# Patient Record
Sex: Female | Born: 1950
Health system: Southern US, Community
[De-identification: ages and names within clinical notes are randomized; demographics above are authoritative.]

## PROBLEM LIST (undated history)

## (undated) DIAGNOSIS — Z87898 Personal history of other specified conditions: Secondary | ICD-10-CM

## (undated) DIAGNOSIS — I8390 Asymptomatic varicose veins of unspecified lower extremity: Secondary | ICD-10-CM

## (undated) DIAGNOSIS — F32A Depression, unspecified: Secondary | ICD-10-CM

## (undated) DIAGNOSIS — T7029XA Other effects of high altitude, initial encounter: Secondary | ICD-10-CM

## (undated) DIAGNOSIS — K219 Gastro-esophageal reflux disease without esophagitis: Secondary | ICD-10-CM

## (undated) DIAGNOSIS — D649 Anemia, unspecified: Secondary | ICD-10-CM

## (undated) DIAGNOSIS — F329 Major depressive disorder, single episode, unspecified: Secondary | ICD-10-CM

## (undated) DIAGNOSIS — G709 Myoneural disorder, unspecified: Secondary | ICD-10-CM

## (undated) DIAGNOSIS — Z8719 Personal history of other diseases of the digestive system: Secondary | ICD-10-CM

## (undated) DIAGNOSIS — F419 Anxiety disorder, unspecified: Secondary | ICD-10-CM

## (undated) DIAGNOSIS — C44611 Basal cell carcinoma of skin of unspecified upper limb, including shoulder: Secondary | ICD-10-CM

## (undated) DIAGNOSIS — L570 Actinic keratosis: Secondary | ICD-10-CM

## (undated) DIAGNOSIS — R609 Edema, unspecified: Secondary | ICD-10-CM

## (undated) DIAGNOSIS — M1990 Unspecified osteoarthritis, unspecified site: Secondary | ICD-10-CM

## (undated) HISTORY — PX: CERVICAL FUSION: SHX112

## (undated) HISTORY — DX: Basal cell carcinoma of skin of unspecified upper limb, including shoulder: C44.611

## (undated) HISTORY — PX: FOOT FUSION: SHX956

## (undated) HISTORY — DX: Other effects of high altitude, initial encounter: T70.29XA

## (undated) HISTORY — PX: CARPAL TUNNEL RELEASE: SHX101

## (undated) HISTORY — DX: Personal history of other specified conditions: Z87.898

## (undated) HISTORY — DX: Anemia, unspecified: D64.9

## (undated) HISTORY — PX: ABDOMINAL HYSTERECTOMY: SHX81

## (undated) HISTORY — DX: Personal history of other diseases of the digestive system: Z87.19

---
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
## 2006-04-22 DIAGNOSIS — Z87898 Personal history of other specified conditions: Secondary | ICD-10-CM

## 2006-04-22 DIAGNOSIS — T7020XA Unspecified effects of high altitude, initial encounter: Secondary | ICD-10-CM

## 2006-04-22 DIAGNOSIS — D649 Anemia, unspecified: Secondary | ICD-10-CM

## 2006-04-22 DIAGNOSIS — Z8711 Personal history of peptic ulcer disease: Secondary | ICD-10-CM

## 2006-04-22 HISTORY — DX: Anemia, unspecified: D64.9

## 2006-04-22 HISTORY — DX: Personal history of other specified conditions: Z87.898

## 2006-04-22 HISTORY — DX: Personal history of peptic ulcer disease: Z87.11

## 2006-04-22 HISTORY — DX: Unspecified effects of high altitude, initial encounter: T70.20XA

## 2009-02-08 ENCOUNTER — Ambulatory Visit: Payer: Self-pay | Admitting: Specialist

## 2009-02-17 ENCOUNTER — Inpatient Hospital Stay: Payer: Self-pay | Admitting: Internal Medicine

## 2011-03-28 LAB — BASIC METABOLIC PANEL
Creatinine: 0.8 mg/dL (ref 0.5–1.1)
Glucose: 74 mg/dL
Potassium: 4 mmol/L (ref 3.4–5.3)
Sodium: 140 mmol/L (ref 137–147)

## 2011-03-28 LAB — CBC AND DIFFERENTIAL
HCT: 37 % (ref 36–46)
WBC: 2.5 10^3/mL

## 2011-03-28 LAB — LIPID PANEL: HDL: 81 mg/dL — AB (ref 35–70)

## 2011-05-29 ENCOUNTER — Ambulatory Visit (INDEPENDENT_AMBULATORY_CARE_PROVIDER_SITE_OTHER): Payer: BC Managed Care – PPO | Admitting: Internal Medicine

## 2011-05-29 ENCOUNTER — Encounter: Payer: Self-pay | Admitting: Internal Medicine

## 2011-05-29 VITALS — BP 110/60 | HR 76 | Temp 98.3°F | Ht 66.0 in

## 2011-05-29 DIAGNOSIS — C44611 Basal cell carcinoma of skin of unspecified upper limb, including shoulder: Secondary | ICD-10-CM | POA: Insufficient documentation

## 2011-05-29 DIAGNOSIS — Z803 Family history of malignant neoplasm of breast: Secondary | ICD-10-CM

## 2011-05-29 DIAGNOSIS — Z8719 Personal history of other diseases of the digestive system: Secondary | ICD-10-CM

## 2011-05-29 DIAGNOSIS — G4700 Insomnia, unspecified: Secondary | ICD-10-CM

## 2011-05-29 MED ORDER — ZOLPIDEM TARTRATE ER 12.5 MG PO TBCR: 12.5000 mg | EXTENDED_RELEASE_TABLET | Freq: Every evening | ORAL | Status: DC | PRN

## 2011-05-29 NOTE — Assessment & Plan Note (Addendum)
Has been having insomnia for over 10 yeas, controlled on ambien cr with periodic awakenings . Has had difficulty lately going back to sleep because of family stressors.  Recommended daily exercise to relieve stress,  continue ambien cr.

## 2011-05-29 NOTE — Progress Notes (Signed)
  Subjective:    Patient ID: Jessica Haas, female    DOB: [birth year], 61 y.o.   MRN: [id number]  HPI Jessica Haas is a healthy 61 year old white female with a history of a gastric ulcer in 2010 which occurred in the setting of NSAID use for shoulder and wrist pain: It in any brief admission to the [facility] for evaluation and transfusion. He is here to establish primary care and is transferring from Dr. Francia Greaves. All records are not currently available. Patient feels generally well except for occasional hot flashes and anxiety. She has a history of prior trial of Wellbutrin which caused palpitations.  Past Medical History  Diagnosis Date  . Basal cell carcinoma of hand   . H/O abnormal mammogram 2008    receiving annual MRIs, [facility]   . High altitude sickness 2008  . Anemia 2008    required transfusion  . H/O gastric ulcer 2008   No current outpatient prescriptions on file prior to visit.     Review of Systems  Constitutional: Negative for fever, chills and unexpected weight change.  HENT: Negative for hearing loss, ear pain, nosebleeds, congestion, sore throat, facial swelling, rhinorrhea, sneezing, mouth sores, trouble swallowing, neck pain, neck stiffness, voice change, postnasal drip, sinus pressure, tinnitus and ear discharge.   Eyes: Negative for pain, discharge, redness and visual disturbance.  Respiratory: Negative for cough, chest tightness, shortness of breath, wheezing and stridor.   Cardiovascular: Negative for chest pain, palpitations and leg swelling.  Musculoskeletal: Negative for myalgias and arthralgias.  Skin: Negative for color change and rash.  Neurological: Negative for dizziness, weakness, light-headedness and headaches.  Hematological: Negative for adenopathy.       Objective:   Physical Exam  Constitutional: She is oriented to person, place, and time. She appears well-developed and well-nourished.  HENT:  Mouth/Throat: Oropharynx is clear and moist.  Eyes:  EOM are normal. Pupils are equal, round, and reactive to light. No scleral icterus.  Neck: Normal range of motion. Neck supple. No JVD present. No thyromegaly present.  Cardiovascular: Normal rate, regular rhythm, normal heart sounds and intact distal pulses.   Pulmonary/Chest: Effort normal and breath sounds normal.  Abdominal: Soft. Bowel sounds are normal. She exhibits no mass. There is no tenderness.  Musculoskeletal: Normal range of motion. She exhibits no edema.  Lymphadenopathy:    She has no cervical adenopathy.  Neurological: She is alert and oriented to person, place, and time.  Skin: Skin is warm and dry.  Psychiatric: She has a normal mood and affect.          Assessment & Plan:

## 2011-05-30 ENCOUNTER — Other Ambulatory Visit: Payer: Self-pay | Admitting: *Deleted

## 2011-05-30 ENCOUNTER — Encounter: Payer: Self-pay | Admitting: Internal Medicine

## 2011-05-30 DIAGNOSIS — Z8719 Personal history of other diseases of the digestive system: Secondary | ICD-10-CM | POA: Insufficient documentation

## 2011-05-30 DIAGNOSIS — Z87898 Personal history of other specified conditions: Secondary | ICD-10-CM | POA: Insufficient documentation

## 2011-05-30 NOTE — Assessment & Plan Note (Signed)
Her gastric ulcer caused significant anemia requiring transfusion several years ago. This was followed by a year of iron therapy. She has not had any recent occurrence of anemia but does not know when her last hemoglobin was checked. Will review records from Dr. Lin Givens in

## 2011-05-30 NOTE — Assessment & Plan Note (Signed)
She has had repeatedly abnormal mammograms and has a maternal history of bilateral breast cancer. Her breast surveillance is done with the with annual MRIs at [facility].

## 2011-05-30 NOTE — Telephone Encounter (Signed)
Pt was seen yesterday, asking that a refill on celebrex be sent to [facility].

## 2011-06-03 MED ORDER — CELECOXIB 200 MG PO CAPS: 200.0000 mg | ORAL_CAPSULE | Freq: Every day | ORAL | Status: DC

## 2011-06-03 NOTE — Telephone Encounter (Signed)
Nursing staff at [facility] aren't allowed to refill anti inflammatories, so that's why I send them to you for approval.

## 2011-06-03 NOTE — Telephone Encounter (Signed)
Di d you need a printed copy?  I'm not sure why you needed to ask me about this one

## 2011-06-03 NOTE — Telephone Encounter (Signed)
Ok, we'll follow their rules.  Thanks!

## 2011-06-13 ENCOUNTER — Encounter: Payer: Self-pay | Admitting: Internal Medicine

## 2011-06-14 ENCOUNTER — Telehealth: Payer: Self-pay | Admitting: *Deleted

## 2011-06-14 NOTE — Telephone Encounter (Signed)
PA required for Ambien. Form pending completion from MD

## 2011-06-20 ENCOUNTER — Other Ambulatory Visit: Payer: Self-pay | Admitting: Internal Medicine

## 2011-06-21 MED ORDER — ALPRAZOLAM 0.25 MG PO TABS: 0.2500 mg | ORAL_TABLET | Freq: Two times a day (BID) | ORAL | Status: DC

## 2011-06-25 ENCOUNTER — Telehealth: Payer: Self-pay | Admitting: Internal Medicine

## 2011-06-25 DIAGNOSIS — L97509 Non-pressure chronic ulcer of other part of unspecified foot with unspecified severity: Secondary | ICD-10-CM

## 2011-06-25 NOTE — Telephone Encounter (Signed)
[phone] Pt came in today and stated that she and dr Darrick Huntsman talked about a referral to the wound care for sore between toes.  She stated that she thinks she needs this now.  She would like to go to [facility]

## 2011-06-26 ENCOUNTER — Other Ambulatory Visit: Payer: Self-pay | Admitting: Internal Medicine

## 2011-06-26 MED ORDER — ZOLPIDEM TARTRATE ER 12.5 MG PO TBCR: 12.5000 mg | EXTENDED_RELEASE_TABLET | Freq: Every evening | ORAL | Status: DC | PRN

## 2011-06-26 NOTE — Telephone Encounter (Signed)
Please start referral process to [facility] for persistent nonhealing foot ulcer

## 2011-06-28 NOTE — Telephone Encounter (Signed)
Marj has already sent the information over to [facility] wound center on 06/26/11.

## 2011-07-03 NOTE — Telephone Encounter (Signed)
Form faxed into insurance

## 2011-07-09 ENCOUNTER — Encounter: Payer: Self-pay | Admitting: Cardiothoracic Surgery

## 2011-07-09 ENCOUNTER — Encounter: Payer: Self-pay | Admitting: Nurse Practitioner

## 2011-07-22 ENCOUNTER — Encounter: Payer: Self-pay | Admitting: Cardiothoracic Surgery

## 2011-07-22 ENCOUNTER — Encounter: Payer: Self-pay | Admitting: Nurse Practitioner

## 2011-10-10 ENCOUNTER — Telehealth: Payer: Self-pay | Admitting: Internal Medicine

## 2011-10-10 NOTE — Telephone Encounter (Signed)
Called and spoke with patient, advised UC due to pain and patient said that she can't wait until tomorrow to be seen.

## 2011-10-10 NOTE — Telephone Encounter (Signed)
EMERGENT: Caller: Minna Antis; PCP: Duncan Dull; CB#: [phone]; Call regarding Patient Hurt Her Back.;  Strained back when getting up from floor with pain in lower back and RLQ abdomen. Onset: 1100 10/10/11.   Had intermittent pain in "Right Ovary" RLQ  over past month.  Unable to stand up straight.   Advised to see MD now for new onset of severe disabling back pain per Back Symptoms Guideline.  No appts remain in Epic for 10/10/11;  Info noted and sent to MD and [place].  Please call back.

## 2011-10-17 ENCOUNTER — Other Ambulatory Visit: Payer: Self-pay | Admitting: *Deleted

## 2011-10-17 MED ORDER — ALPRAZOLAM 0.25 MG PO TABS: 0.2500 mg | ORAL_TABLET | Freq: Two times a day (BID) | ORAL | Status: DC

## 2011-10-17 NOTE — Telephone Encounter (Signed)
[facility].

## 2011-12-03 ENCOUNTER — Other Ambulatory Visit: Payer: Self-pay | Admitting: Internal Medicine

## 2011-12-03 MED ORDER — ZOLPIDEM TARTRATE ER 12.5 MG PO TBCR: 12.5000 mg | EXTENDED_RELEASE_TABLET | Freq: Every evening | ORAL | Status: DC | PRN

## 2011-12-06 ENCOUNTER — Other Ambulatory Visit: Payer: Self-pay | Admitting: *Deleted

## 2011-12-06 MED ORDER — ALPRAZOLAM 0.25 MG PO TABS: 0.2500 mg | ORAL_TABLET | Freq: Two times a day (BID) | ORAL | Status: DC

## 2011-12-09 ENCOUNTER — Other Ambulatory Visit: Payer: Self-pay | Admitting: Internal Medicine

## 2011-12-27 ENCOUNTER — Encounter: Payer: Self-pay | Admitting: Internal Medicine

## 2011-12-27 ENCOUNTER — Ambulatory Visit: Payer: Self-pay | Admitting: Internal Medicine

## 2011-12-27 ENCOUNTER — Ambulatory Visit (INDEPENDENT_AMBULATORY_CARE_PROVIDER_SITE_OTHER): Payer: BC Managed Care – PPO | Admitting: Internal Medicine

## 2011-12-27 VITALS — BP 126/82 | HR 60 | Temp 98.5°F | Ht 66.0 in | Wt 146.5 lb

## 2011-12-27 DIAGNOSIS — R1031 Right lower quadrant pain: Secondary | ICD-10-CM

## 2011-12-27 LAB — POCT URINALYSIS DIPSTICK
Bilirubin, UA: NEGATIVE
Leukocytes, UA: NEGATIVE
Nitrite, UA: NEGATIVE
Protein, UA: NEGATIVE
pH, UA: 7

## 2011-12-27 MED ORDER — CIPROFLOXACIN HCL 500 MG PO TABS: 500.0000 mg | ORAL_TABLET | Freq: Two times a day (BID) | ORAL | Status: AC

## 2011-12-27 NOTE — Addendum Note (Signed)
Addended by: Jobie Quaker on: 12/27/2011 05:45 PM   Modules accepted: Orders

## 2011-12-27 NOTE — Assessment & Plan Note (Addendum)
Symptoms seem most consistent with ovarian cyst or torsion. However, pelvic ultrasound today was normal (preliminary report). No symptoms to suggest appendicitis or bowel pathology. Alternative consideration might be inguinal hernia however no palpable defect on exam today. Will set up followup next week. If symptoms are persisting, CT of the abdomen might be helpful.

## 2011-12-27 NOTE — Addendum Note (Signed)
Addended by: Ronna Polio A on: 12/27/2011 05:51 PM   Modules accepted: Orders

## 2011-12-27 NOTE — Progress Notes (Signed)
Subjective:    Patient ID: Jessica Haas, female    DOB: 12-14-1950, 61 y.o.   MRN: [id number]  HPI 61 year old female presents for acute visit complaining of 2 month history of right lower abdominal pain. She reports that the pain is characterized as pulling sensation in her right lower abdomen. It seems to have gotten lower in her abdomen over the last 2 months. She denies any change in her bowel habits, constipation, diarrhea, nausea, vomiting, fever, chills, vaginal bleeding or discharge , fatigue, rash, or other complaints. The pain tends to come and go. It is sometimes worsened with movement. She notes that she had a hysterectomy but did not have ovaries removed.  Outpatient Encounter Prescriptions as of 12/27/2011  Medication Sig Dispense Refill  . ALPRAZolam (XANAX) 0.25 MG tablet Take 1 tablet (0.25 mg total) by mouth 2 (two) times daily.  30 tablet  2  . celecoxib (CELEBREX) 200 MG capsule Take 1 capsule (200 mg total) by mouth daily.  90 capsule  3  . conjugated estrogens (PREMARIN) vaginal cream Use 2 times a week.      . estrogens, conjugated, (PREMARIN) 0.625 MG tablet Take one half tablet by mouth 3 times a week.      Marland Kitchen FLUoxetine (PROZAC) 20 MG tablet Take one by mouth 3 times a week.      . zolpidem (AMBIEN CR) 12.5 MG CR tablet Take 1 tablet (12.5 mg total) by mouth at bedtime as needed.  90 tablet  3   BP 126/82  Pulse 60  Temp 98.5 F (36.9 C) (Oral)  Ht 5\' 6"  (1.676 m)  Wt 146 lb 8 oz (66.452 kg)  BMI 23.65 kg/m2  SpO2 98%  Review of Systems  Constitutional: Negative for fever, chills, appetite change, fatigue and unexpected weight change.  HENT: Negative for ear pain, congestion, sore throat, trouble swallowing, neck pain, voice change and sinus pressure.   Eyes: Negative for visual disturbance.  Respiratory: Negative for cough, shortness of breath, wheezing and stridor.   Cardiovascular: Negative for chest pain, palpitations and leg swelling.  Gastrointestinal:  Positive for abdominal pain. Negative for nausea, vomiting, diarrhea, constipation, blood in stool, abdominal distention and anal bleeding.  Genitourinary: Negative for dysuria and flank pain.  Musculoskeletal: Negative for myalgias, arthralgias and gait problem.  Skin: Negative for color change and rash.  Neurological: Negative for dizziness and headaches.  Hematological: Negative for adenopathy. Does not bruise/bleed easily.  Psychiatric/Behavioral: Negative for suicidal ideas, disturbed wake/sleep cycle and dysphoric mood. The patient is not nervous/anxious.        Objective:   Physical Exam  Constitutional: She is oriented to person, place, and time. She appears well-developed and well-nourished. No distress.  HENT:  Head: Normocephalic and atraumatic.  Right Ear: External ear normal.  Left Ear: External ear normal.  Nose: Nose normal.  Mouth/Throat: Oropharynx is clear and moist. No oropharyngeal exudate.  Eyes: Conjunctivae are normal. Pupils are equal, round, and reactive to light. Right eye exhibits no discharge. Left eye exhibits no discharge. No scleral icterus.  Neck: Normal range of motion. Neck supple. No tracheal deviation present. No thyromegaly present.  Cardiovascular: Normal rate, regular rhythm, normal heart sounds and intact distal pulses.  Exam reveals no gallop and no friction rub.   No murmur heard. Pulmonary/Chest: Effort normal and breath sounds normal. No respiratory distress. She has no wheezes. She has no rales. She exhibits no tenderness.  Abdominal: Soft. Bowel sounds are normal. She exhibits no distension and no  mass. There is tenderness. There is no rebound and no guarding.    Musculoskeletal: Normal range of motion. She exhibits no edema and no tenderness.  Lymphadenopathy:    She has no cervical adenopathy.  Neurological: She is alert and oriented to person, place, and time. No cranial nerve deficit. She exhibits normal muscle tone. Coordination normal.   Skin: Skin is warm and dry. No rash noted. She is not diaphoretic. No erythema. No pallor.  Psychiatric: She has a normal mood and affect. Her behavior is normal. Judgment and thought content normal.          Assessment & Plan:

## 2011-12-30 ENCOUNTER — Encounter: Payer: Self-pay | Admitting: Internal Medicine

## 2011-12-30 ENCOUNTER — Telehealth: Payer: Self-pay | Admitting: Internal Medicine

## 2011-12-30 NOTE — Telephone Encounter (Signed)
Opened in error

## 2011-12-31 NOTE — Addendum Note (Signed)
Addended by: Ronna Polio A on: 12/31/2011 07:49 PM   Modules accepted: Orders

## 2012-01-01 ENCOUNTER — Telehealth: Payer: Self-pay | Admitting: Internal Medicine

## 2012-01-01 NOTE — Telephone Encounter (Signed)
Cell [phone] Pt spouse came in today checking on ct that ms Sabino needs and wants to know why its taking so long to make appointment.  And also rx cipro was called in for her and mr Retherford stated that they had no idea this was being called in for her until pharmacy called to let them know.  She is taking the meds.  Could someone and talk to them about this

## 2012-01-02 ENCOUNTER — Telehealth: Payer: Self-pay | Admitting: Internal Medicine

## 2012-01-02 ENCOUNTER — Ambulatory Visit: Payer: Self-pay | Admitting: Internal Medicine

## 2012-01-02 NOTE — Telephone Encounter (Signed)
Left message asking patient to return my call.

## 2012-01-02 NOTE — Telephone Encounter (Signed)
Patient returned my call and left me a voice mail asking me to return my call.  I called patient back and she states that she did receive call back on Friday and was told that her [place] was normal.  However when the pharmacist called and advised her of the Cipro she wasn't aware that there was blood in her urine.  She did say that she sent an email through my chart over the weekend and hadn't heard back from anyone on that.  I advised patient that Dr. Dan Humphreys and I spoke this morning when I was trying to gather information that her [place] was normal but since she was having pain that she recommended a CT.  I also told the patient that they saw blood in her urine and that is why the medication was called in for her.  She said that her husband was inpatient since they weren't aware of why she was being set up for a CT.  Patient feels better now that I have answered her questions and she goes today for the CT scan.

## 2012-01-02 NOTE — Telephone Encounter (Signed)
Patient advised as instructed via telephone, appt scheduled for tomorrow at 11:00 with Dr. Dan Humphreys.

## 2012-01-02 NOTE — Telephone Encounter (Signed)
CT of the abdomen was normal with no evidence of kidney stone. I would like for pt to repeat urinalysis to make sure blood has cleared. If abdominal pain persists, then needs to have follow up visit

## 2012-01-03 ENCOUNTER — Encounter: Payer: Self-pay | Admitting: Internal Medicine

## 2012-01-03 ENCOUNTER — Ambulatory Visit (INDEPENDENT_AMBULATORY_CARE_PROVIDER_SITE_OTHER): Payer: BC Managed Care – PPO | Admitting: Internal Medicine

## 2012-01-03 VITALS — BP 140/90 | HR 70 | Temp 98.0°F | Ht 66.0 in | Wt 147.5 lb

## 2012-01-03 DIAGNOSIS — R1031 Right lower quadrant pain: Secondary | ICD-10-CM

## 2012-01-03 DIAGNOSIS — G4700 Insomnia, unspecified: Secondary | ICD-10-CM

## 2012-01-03 MED ORDER — ZOLPIDEM TARTRATE ER 12.5 MG PO TBCR: 12.5000 mg | EXTENDED_RELEASE_TABLET | Freq: Every evening | ORAL | Status: DC | PRN

## 2012-01-03 NOTE — Assessment & Plan Note (Signed)
Symptoms of right lower quadrant abdominal pain are persistent. Pelvic ultrasound was normal. Noncontrast CT was also normal and showed no evidence of nephrolithiasis. On that exam they were able to visualize the appendix which was normal. Question whether she may have an inguinal hernia. Small area of bulging noted on Valsalva on exam. Will set up Gen. surgery evaluation.

## 2012-01-03 NOTE — Progress Notes (Signed)
Subjective:    Patient ID: Jessica Haas, female    DOB: [birth year], 61 y.o.   MRN: [id number]  HPI 61 year old female presents for followup of recent right lower quadrant abdominal pain. Abdominal pain has been intermittent for approximately 2 months. At her last visit, she was sent for pelvic ultrasound to evaluate for ovarian pathology. Ultrasound was normal. She was noted to have hematuria on urinalysis and was treated empirically for urinary tract infection. Repeat urinalysis today shows no hematuria. She denies any symptoms of dysuria, frequency, fever, chills, flank pain. She underwent CT of the abdomen without contrast which showed no evidence of nephrolithiasis. Appendix was visualized on that scan and was normal. She reports that intermittent abdominal pain in the right lower quadrant is persistent. It is worsened by movement and coughing.  Outpatient Encounter Prescriptions as of 01/03/2012  Medication Sig Dispense Refill  . ALPRAZolam (XANAX) 0.25 MG tablet Take 1 tablet (0.25 mg total) by mouth 2 (two) times daily.  30 tablet  2  . celecoxib (CELEBREX) 200 MG capsule Take 1 capsule (200 mg total) by mouth daily.  90 capsule  3  . ciprofloxacin (CIPRO) 500 MG tablet Take 1 tablet (500 mg total) by mouth 2 (two) times daily.  14 tablet  0  . conjugated estrogens (PREMARIN) vaginal cream Use 2 times a week.      . estrogens, conjugated, (PREMARIN) 0.625 MG tablet Take one half tablet by mouth 3 times a week.      Marland Kitchen FLUoxetine (PROZAC) 20 MG tablet Take one by mouth 3 times a week.      . zolpidem (AMBIEN CR) 12.5 MG CR tablet Take 1 tablet (12.5 mg total) by mouth at bedtime as needed.  30 tablet  3  . DISCONTD: zolpidem (AMBIEN CR) 12.5 MG CR tablet Take 1 tablet (12.5 mg total) by mouth at bedtime as needed.  90 tablet  3   BP 140/90  Pulse 70  Temp 98 F (36.7 C) (Oral)  Ht 5\' 6"  (1.676 m)  Wt 147 lb 8 oz (66.906 kg)  BMI 23.81 kg/m2  SpO2 97%   Review of Systems    Constitutional: Negative for fever, chills, appetite change, fatigue and unexpected weight change.  Eyes: Negative for visual disturbance.  Respiratory: Negative for cough, shortness of breath, wheezing and stridor.   Cardiovascular: Negative for chest pain, palpitations and leg swelling.  Gastrointestinal: Positive for abdominal pain. Negative for nausea, vomiting, diarrhea, constipation, blood in stool, abdominal distention and anal bleeding.  Genitourinary: Negative for dysuria and flank pain.  Musculoskeletal: Negative for myalgias, arthralgias and gait problem.  Skin: Negative for color change and rash.  Neurological: Negative for dizziness and headaches.  Hematological: Negative for adenopathy. Does not bruise/bleed easily.  Psychiatric/Behavioral: Negative for disturbed wake/sleep cycle and dysphoric mood. The patient is not nervous/anxious.        Objective:   Physical Exam  Constitutional: She is oriented to person, place, and time. She appears well-developed and well-nourished. No distress.  HENT:  Head: Normocephalic and atraumatic.  Right Ear: External ear normal.  Left Ear: External ear normal.  Nose: Nose normal.  Mouth/Throat: Oropharynx is clear and moist. No oropharyngeal exudate.  Eyes: Conjunctivae normal are normal. Pupils are equal, round, and reactive to light.  Neck: Normal range of motion. Neck supple.  Cardiovascular: Exam reveals friction rub.   Pulmonary/Chest: Effort normal.  Abdominal: Soft. Bowel sounds are normal. She exhibits no distension and no mass. There is tenderness (right  lower quadrant, mild, just lateral to pubic bone). There is no guarding.    Musculoskeletal: Normal range of motion. She exhibits no edema and no tenderness.  Neurological: She is alert and oriented to person, place, and time. No cranial nerve deficit. She exhibits normal muscle tone. Coordination normal.  Skin: Skin is warm and dry. No rash noted. She is not diaphoretic. No  erythema. No pallor.  Psychiatric: She has a normal mood and affect. Her behavior is normal. Judgment and thought content normal.          Assessment & Plan:

## 2012-01-06 ENCOUNTER — Encounter: Payer: Self-pay | Admitting: Internal Medicine

## 2012-01-08 ENCOUNTER — Encounter: Payer: Self-pay | Admitting: Internal Medicine

## 2012-01-28 ENCOUNTER — Other Ambulatory Visit: Payer: Self-pay | Admitting: Internal Medicine

## 2012-01-28 NOTE — Telephone Encounter (Signed)
Ok to refill alprazolam by phone.  I will update chart

## 2012-01-28 NOTE — Telephone Encounter (Signed)
This is Dr. Tilman Neat patientn

## 2012-01-29 NOTE — Telephone Encounter (Signed)
Xanax 0.25 mg # 30 2 R  [facility] [phone]

## 2012-03-15 ENCOUNTER — Other Ambulatory Visit: Payer: Self-pay | Admitting: Internal Medicine

## 2012-03-16 ENCOUNTER — Other Ambulatory Visit: Payer: Self-pay

## 2012-04-24 ENCOUNTER — Other Ambulatory Visit: Payer: BC Managed Care – PPO

## 2012-04-24 ENCOUNTER — Telehealth: Payer: Self-pay | Admitting: Internal Medicine

## 2012-04-27 ENCOUNTER — Encounter: Payer: BC Managed Care – PPO | Admitting: Internal Medicine

## 2012-04-27 ENCOUNTER — Other Ambulatory Visit: Payer: BC Managed Care – PPO

## 2012-04-27 ENCOUNTER — Other Ambulatory Visit (INDEPENDENT_AMBULATORY_CARE_PROVIDER_SITE_OTHER): Payer: BC Managed Care – PPO

## 2012-04-27 ENCOUNTER — Telehealth: Payer: Self-pay | Admitting: *Deleted

## 2012-04-27 DIAGNOSIS — Z0000 Encounter for general adult medical examination without abnormal findings: Secondary | ICD-10-CM

## 2012-04-27 LAB — COMPREHENSIVE METABOLIC PANEL
ALT: 14 U/L (ref 0–35)
AST: 24 U/L (ref 0–37)
BUN: 19 mg/dL (ref 6–23)
Creatinine, Ser: 0.8 mg/dL (ref 0.4–1.2)
Total Bilirubin: 0.7 mg/dL (ref 0.3–1.2)

## 2012-04-27 LAB — LDL CHOLESTEROL, DIRECT: Direct LDL: 135.2 mg/dL

## 2012-04-27 LAB — TSH: TSH: 0.8 u[IU]/mL (ref 0.35–5.50)

## 2012-04-27 LAB — LIPID PANEL: HDL: 86.1 mg/dL (ref >39.00)

## 2012-04-27 NOTE — Telephone Encounter (Signed)
What labs and dx would you like for this pt? Thank you  

## 2012-04-27 NOTE — Telephone Encounter (Signed)
tsh cmet,  Vit d and fasting lipids

## 2012-04-30 ENCOUNTER — Other Ambulatory Visit: Payer: Self-pay | Admitting: Internal Medicine

## 2012-04-30 NOTE — Telephone Encounter (Signed)
ALPRAZolam (XANAX) 0.25 MG tablet

## 2012-05-02 MED ORDER — ALPRAZOLAM 0.25 MG PO TABS: 0.2500 mg | ORAL_TABLET | Freq: Every evening | ORAL | Status: DC | PRN

## 2012-05-02 NOTE — Telephone Encounter (Signed)
Xanax refill request. Pt last seen on 9/13. Med last filled on 11/24 #30 with 2 refills.

## 2012-05-04 ENCOUNTER — Encounter: Payer: BC Managed Care – PPO | Admitting: Internal Medicine

## 2012-05-15 ENCOUNTER — Telehealth: Payer: Self-pay | Admitting: Internal Medicine

## 2012-05-15 NOTE — Telephone Encounter (Signed)
LMOVM for pt to return call to schedule 30 min appt.

## 2012-05-15 NOTE — Telephone Encounter (Signed)
Xanax was just filled on 05/02/12 #30 with 1 refill. Pt has not seen you since 06/03/11 but has seen Dr. Dan Humphreys twice in September. Ok to fill the celebrex?

## 2012-05-15 NOTE — Telephone Encounter (Signed)
No refills on xanax until she is seen, as she is requesting an early refill.  and daily celebrex is not a good idea since she has a history of gastric ulcer. So no refills on that either until seen.

## 2012-05-15 NOTE — Telephone Encounter (Signed)
CELEBREX 200 MG capsule   ALPRAZolam (XANAX) 0.25 MG tablet

## 2012-05-19 NOTE — Telephone Encounter (Signed)
Pt has an appt on 1/30.

## 2012-05-20 ENCOUNTER — Other Ambulatory Visit: Payer: Self-pay | Admitting: *Deleted

## 2012-05-20 MED ORDER — CELECOXIB 200 MG PO CAPS: 200.0000 mg | ORAL_CAPSULE | Freq: Every day | ORAL | Status: DC

## 2012-05-20 NOTE — Telephone Encounter (Signed)
Ok to fill Pt has not seen you since 2/13 however has seen Dr. Dan Humphreys last on 9/13

## 2012-05-21 ENCOUNTER — Ambulatory Visit (INDEPENDENT_AMBULATORY_CARE_PROVIDER_SITE_OTHER): Payer: BC Managed Care – PPO | Admitting: Internal Medicine

## 2012-05-21 ENCOUNTER — Other Ambulatory Visit: Payer: Self-pay | Admitting: *Deleted

## 2012-05-21 ENCOUNTER — Encounter: Payer: Self-pay | Admitting: Internal Medicine

## 2012-05-21 VITALS — BP 120/72 | HR 78 | Temp 97.8°F | Resp 16 | Ht 66.5 in | Wt 141.5 lb

## 2012-05-21 DIAGNOSIS — G4700 Insomnia, unspecified: Secondary | ICD-10-CM

## 2012-05-21 DIAGNOSIS — Z0000 Encounter for general adult medical examination without abnormal findings: Secondary | ICD-10-CM

## 2012-05-21 DIAGNOSIS — Z803 Family history of malignant neoplasm of breast: Secondary | ICD-10-CM

## 2012-05-21 DIAGNOSIS — R1031 Right lower quadrant pain: Secondary | ICD-10-CM

## 2012-05-21 DIAGNOSIS — Z8719 Personal history of other diseases of the digestive system: Secondary | ICD-10-CM

## 2012-05-21 DIAGNOSIS — Z8711 Personal history of peptic ulcer disease: Secondary | ICD-10-CM

## 2012-05-21 DIAGNOSIS — Z862 Personal history of diseases of the blood and blood-forming organs and certain disorders involving the immune mechanism: Secondary | ICD-10-CM

## 2012-05-21 DIAGNOSIS — D709 Neutropenia, unspecified: Secondary | ICD-10-CM

## 2012-05-21 DIAGNOSIS — Z1239 Encounter for other screening for malignant neoplasm of breast: Secondary | ICD-10-CM

## 2012-05-21 LAB — CBC WITH DIFFERENTIAL/PLATELET
Basophils Relative: 1.2 % (ref 0.0–3.0)
Eosinophils Relative: 1.4 % (ref 0.0–5.0)
HCT: 37.8 % (ref 36.0–46.0)
Lymphs Abs: 0.9 10*3/uL (ref 0.7–4.0)
MCV: 87.7 fl (ref 78.0–100.0)
Monocytes Absolute: 0.3 10*3/uL (ref 0.1–1.0)
Monocytes Relative: 10.5 % (ref 3.0–12.0)
Neutrophils Relative %: 59 % (ref 43.0–77.0)
RBC: 4.3 Mil/uL (ref 3.87–5.11)
WBC: 3.2 10*3/uL — ABNORMAL LOW (ref 4.5–10.5)

## 2012-05-21 LAB — IRON AND TIBC: UIBC: 173 ug/dL (ref 125–400)

## 2012-05-21 LAB — FERRITIN: Ferritin: 34.5 ng/mL (ref 10.0–291.0)

## 2012-05-21 MED ORDER — FLUOXETINE HCL 20 MG PO TABS: ORAL_TABLET | ORAL | Status: AC

## 2012-05-21 MED ORDER — ALPRAZOLAM 0.25 MG PO TABS: 0.2500 mg | ORAL_TABLET | Freq: Every evening | ORAL | Status: DC | PRN

## 2012-05-21 MED ORDER — ZOLPIDEM TARTRATE ER 12.5 MG PO TBCR: 12.5000 mg | EXTENDED_RELEASE_TABLET | Freq: Every evening | ORAL | Status: DC | PRN

## 2012-05-21 MED ORDER — ESOMEPRAZOLE MAGNESIUM 40 MG PO CPDR: 40.0000 mg | DELAYED_RELEASE_CAPSULE | Freq: Every day | ORAL | Status: DC

## 2012-05-21 MED ORDER — CELECOXIB 200 MG PO CAPS: 200.0000 mg | ORAL_CAPSULE | Freq: Every day | ORAL | Status: DC

## 2012-05-21 NOTE — Assessment & Plan Note (Signed)
She has persistent anxiety which appears to be quite pervasive and centered around her health.  Discussion with patient about her general state of health , which is excellent.  Recommended a daily glass of wine with dinner to help her relax, rather than adding more medication.

## 2012-05-21 NOTE — Assessment & Plan Note (Addendum)
With GI bleed requiring a transfusion.  Celebrex use discussed,. She has been taking it for years for management of OA  without GI symptoms.  She will reume nexium if she is to continue celebrex.,  Cbc to be checked.

## 2012-05-21 NOTE — Progress Notes (Signed)
Patient ID: Jessica Haas, female   DOB: November 18, 1950, 62 y.o.   MRN: [id number]    Subjective:     Jessica Haas is a 62 y.o. female and is here for a comprehensive physical exam. The patient reports \\that  she has had several evaluations by Dr. Dan Haas for mild intermittent RLQ pain which has been evaluate with CT abdomen/pelvis and pelvic ultrasound, both of which were normal. The pain was presumed to be secondary to a small inguinal hernia and she was referred to Gen Surg but she did not have the Gen Surg consult because she had a facelift instead.  She has an appt with Jessica Haas tomorrow.    She is overdue for annual mammogram at [facility], last one was 1.5 years ago.   due to concern about excessive radiation. She has had several breast MRIs due to follow up on a right breast nodule and FH of breast CA.    She has chronic insomnia with recurrent early wakening after 3 hours despite use of Ambien Cr.      History   Social History  . Marital Status: Married    Spouse Name: N/A    Number of Children: N/A  . Years of Education: N/A   Occupational History  . Not on file.   Social History Main Topics  . Smoking status: Former Smoker    Quit date: 05/29/1978  . Smokeless tobacco: Not on file     Comment: Quit 1980.  Marland Kitchen Alcohol Use: No  . Drug Use: Not on file  . Sexually Active: Not on file   Other Topics Concern  . Not on file   Social History Narrative  . No narrative on file   Health Maintenance  Topic Date Due  . Tetanus/tdap  03/23/1968  . Zostavax  03/23/2009  . Mammogram  11/14/2011  . Influenza Vaccine  12/22/2011  . Pap Smear  03/27/2014  . Colonoscopy  02/23/2019    The following portions of the patient's history were reviewed and updated as appropriate: allergies, current medications, past family history, past medical history, past social history, past surgical history and problem list.  Review of Systems A comprehensive review of systems was negative.   Objective:    BP 120/72  Pulse 78  Temp 97.8 F (36.6 C) (Oral)  Resp 16  Ht 5' 6.5" (1.689 m)  Wt 141 lb 8 oz (64.184 kg)  BMI 22.50 kg/m2  SpO2 98%  General Appearance:    Alert, cooperative, no distress, appears stated age  Head:    Normocephalic, without obvious abnormality, atraumatic  Eyes:    PERRL, conjunctiva/corneas clear, EOM's intact, fundi    benign, both eyes  Ears:    Normal TM's and external ear canals, both ears  Nose:   Nares normal, septum midline, mucosa normal, no drainage    or sinus tenderness  Throat:   Lips, mucosa, and tongue normal; teeth and gums normal  Neck:   Supple, symmetrical, trachea midline, no adenopathy;    thyroid:  no enlargement/tenderness/nodules; no carotid   bruit or JVD  Back:     Symmetric, no curvature, ROM normal, no CVA tenderness  Lungs:     Clear to auscultation bilaterally, respirations unlabored  Chest Wall:    No tenderness or deformity   Heart:    Regular rate and rhythm, S1 and S2 normal, no murmur, rub   or gallop  Breast Exam:    Right breast with nodule RUO quadrant, no tenderness, or nipple  abnormality  Abdomen:     Soft, non-tender, bowel sounds active all four quadrants,    no masses, no organomegaly  Genitalia:    Normal vaginal vault, cervix absent, tender bilaterally on bimanual exam, no masses     Extremities:   Extremities normal, atraumatic, no cyanosis or edema  Pulses:   2+ and symmetric all extremities  Skin:   Skin color, texture, turgor normal, no rashes or lesions  Lymph nodes:   Cervical, supraclavicular, and axillary nodes normal  Neurologic:   CNII-XII intact, normal strength, sensation and reflexes    throughout       Assessment:   Routine general medical examination at a health care facility Annual comprehensive exam was done including breast, pelvic were done . All screenings have been addressed .  Reminded to continue annual mammogram at [facility] given FH of BRCA  Neutropenia Asymptomatic and  chronic,   Last WBC was 2.5 in 2013.  Checking today  H/O gastric ulcer With GI bleed requiring a transfusion.  Celebrex use discussed,. She has been taking it for years for management of OA  without GI symptoms.  She will reume nexium if she is to continue celebrex.,  Cbc to be checked.   Right lower quadrant abdominal pain Agree that an early inguinal hernia appears to be the cause, reassurance provided.   appt with Jessica [facility] scheduled.   Family history of breast cancer in first degree relative Advised to resume annual mammograms.  Insomnia, persistent She has persistent anxiety which appears to be quite pervasive and centered around her health.  Discussion with patient about her general state of health , which is excellent.  Recommended a daily glass of wine with dinner to help her relax, rather than adding more medication.    Updated Medication List Outpatient Encounter Prescriptions as of 05/21/2012  Medication Sig Dispense Refill  . ALPRAZolam (XANAX) 0.25 MG tablet Take 1 tablet (0.25 mg total) by mouth at bedtime as needed for sleep.  30 tablet  5  . celecoxib (CELEBREX) 200 MG capsule Take 1 capsule (200 mg total) by mouth daily.  30 capsule  5  . conjugated estrogens (PREMARIN) vaginal cream Use 2 times a week.      . estrogens, conjugated, (PREMARIN) 0.625 MG tablet Take one half tablet by mouth 3 times a week.      Marland Kitchen FLUoxetine (PROZAC) 20 MG tablet Take one by mouth 3 times a week.  36 tablet  3  . zolpidem (AMBIEN CR) 12.5 MG CR tablet Take 1 tablet (12.5 mg total) by mouth at bedtime as needed.  30 tablet  5  . [DISCONTINUED] ALPRAZolam (XANAX) 0.25 MG tablet Take 1 tablet (0.25 mg total) by mouth at bedtime as needed for sleep.  30 tablet  1  . [DISCONTINUED] celecoxib (CELEBREX) 200 MG capsule Take 1 capsule (200 mg total) by mouth daily.  30 capsule  4  . [DISCONTINUED] FLUoxetine (PROZAC) 20 MG tablet Take one by mouth 3 times a week.      . [DISCONTINUED] zolpidem (AMBIEN CR)  12.5 MG CR tablet Take 1 tablet (12.5 mg total) by mouth at bedtime as needed.  30 tablet  3  . esomeprazole (NEXIUM) 40 MG capsule Take 1 capsule (40 mg total) by mouth daily.  30 capsule  3

## 2012-05-21 NOTE — Assessment & Plan Note (Addendum)
Annual comprehensive exam was done including breast, pelvic were done . All screenings have been addressed .  Reminded to continue annual mammogram at [facility] given FH of BRCA

## 2012-05-21 NOTE — Patient Instructions (Addendum)
Please consider drinking a glass of wine before or with dinner to help you relax.  One glass of wine will not increase your chances of breast cancer.  If you resume celebrex daily you must protect your stomach with daily use of Nexium  NEVER add motrin , aleve, to celebrex.  Add tylenol if needed for pain. (maximum 4 tylenol daily)   Your cholesterol is excellent!

## 2012-05-21 NOTE — Assessment & Plan Note (Addendum)
Asymptomatic and  chronic,  Last WBC was 2.5 in 2013.  Checking today

## 2012-05-21 NOTE — Assessment & Plan Note (Signed)
Agree that an early inguinal hernia appears to be the cause, reassurance provided.   appt with wilton Katrinka Blazing scheduled.

## 2012-05-21 NOTE — Assessment & Plan Note (Signed)
Advised to resume annual mammograms.

## 2012-05-22 ENCOUNTER — Other Ambulatory Visit: Payer: Self-pay | Admitting: *Deleted

## 2012-05-22 NOTE — Telephone Encounter (Signed)
Med filled on 05/21/12

## 2012-06-01 ENCOUNTER — Ambulatory Visit: Payer: Self-pay | Admitting: Surgery

## 2012-06-17 ENCOUNTER — Encounter: Payer: Self-pay | Admitting: Internal Medicine

## 2012-06-17 DIAGNOSIS — R1031 Right lower quadrant pain: Secondary | ICD-10-CM

## 2012-07-07 ENCOUNTER — Telehealth: Payer: Self-pay | Admitting: Internal Medicine

## 2012-07-07 NOTE — Telephone Encounter (Signed)
celecoxib (CELEBREX) 200 MG capsule   Patient needing prior authorization for this medication  For [facility]

## 2012-07-07 NOTE — Telephone Encounter (Signed)
Can you start this

## 2012-07-07 NOTE — Telephone Encounter (Signed)
Called [phone] for prior authorization on the Celebrex 200 mg form is being faxed over now

## 2012-07-09 ENCOUNTER — Encounter: Payer: Self-pay | Admitting: Internal Medicine

## 2012-07-22 ENCOUNTER — Encounter: Payer: Self-pay | Admitting: Internal Medicine

## 2012-07-22 MED ORDER — CELECOXIB 200 MG PO CAPS: 200.0000 mg | ORAL_CAPSULE | Freq: Every day | ORAL | Status: AC

## 2012-07-22 NOTE — Telephone Encounter (Signed)
Med filled per pt.  

## 2012-08-26 ENCOUNTER — Other Ambulatory Visit: Payer: Self-pay | Admitting: *Deleted

## 2012-08-26 DIAGNOSIS — G4700 Insomnia, unspecified: Secondary | ICD-10-CM

## 2012-08-27 MED ORDER — ZOLPIDEM TARTRATE ER 12.5 MG PO TBCR: 12.5000 mg | EXTENDED_RELEASE_TABLET | Freq: Every evening | ORAL | Status: DC | PRN

## 2012-08-27 NOTE — Telephone Encounter (Signed)
Ok to refill,  Authorized in epic [place]

## 2012-08-28 NOTE — Telephone Encounter (Signed)
Script faxed to pharmacy

## 2012-10-29 ENCOUNTER — Other Ambulatory Visit: Payer: Self-pay | Admitting: *Deleted

## 2012-10-29 MED ORDER — ALPRAZOLAM 0.25 MG PO TABS: 0.2500 mg | ORAL_TABLET | Freq: Every evening | ORAL | Status: AC | PRN

## 2013-01-06 ENCOUNTER — Ambulatory Visit (INDEPENDENT_AMBULATORY_CARE_PROVIDER_SITE_OTHER): Payer: BC Managed Care – PPO | Admitting: Adult Health

## 2013-01-06 ENCOUNTER — Encounter: Payer: Self-pay | Admitting: Adult Health

## 2013-01-06 VITALS — BP 128/82 | HR 74 | Temp 98.2°F | Resp 12 | Wt 145.5 lb

## 2013-01-06 DIAGNOSIS — R300 Dysuria: Secondary | ICD-10-CM

## 2013-01-06 LAB — POCT URINALYSIS DIPSTICK
Bilirubin, UA: NEGATIVE
Glucose, UA: NEGATIVE
Ketones, UA: NEGATIVE
Spec Grav, UA: 1.015

## 2013-01-06 MED ORDER — CIPROFLOXACIN HCL 250 MG PO TABS: 250.0000 mg | ORAL_TABLET | Freq: Two times a day (BID) | ORAL | Status: DC

## 2013-01-06 MED ORDER — PHENAZOPYRIDINE HCL 200 MG PO TABS: 200.0000 mg | ORAL_TABLET | Freq: Three times a day (TID) | ORAL | Status: DC | PRN

## 2013-01-06 NOTE — Progress Notes (Signed)
  Subjective:    Patient ID: Jessica Haas, female    DOB: 17-Nov-1950, 62 y.o.   MRN: [id number]  HPI  Patient presents with dysuria and frequency since Saturday. She has only taken tylenol. Denies fevers or chills.  Current Outpatient Prescriptions on File Prior to Visit  Medication Sig Dispense Refill  . ALPRAZolam (XANAX) 0.25 MG tablet Take 1 tablet (0.25 mg total) by mouth at bedtime as needed for sleep.  30 tablet  5  . celecoxib (CELEBREX) 200 MG capsule Take 1 capsule (200 mg total) by mouth daily.  90 capsule  2  . FLUoxetine (PROZAC) 20 MG tablet Take one by mouth 3 times a week.  36 tablet  3  . zolpidem (AMBIEN CR) 12.5 MG CR tablet Take 1 tablet (12.5 mg total) by mouth at bedtime as needed.  30 tablet  5  . conjugated estrogens (PREMARIN) vaginal cream Use 2 times a week.      . esomeprazole (NEXIUM) 40 MG capsule Take 1 capsule (40 mg total) by mouth daily.  30 capsule  3  . estrogens, conjugated, (PREMARIN) 0.625 MG tablet Take one half tablet by mouth 3 times a week.       No current facility-administered medications on file prior to visit.    Review of Systems  Constitutional: Negative.   Genitourinary: Positive for dysuria, urgency and frequency. Negative for hematuria and pelvic pain.     BP 128/82  Pulse 74  Temp(Src) 98.2 F (36.8 C) (Oral)  Resp 12  Wt 145 lb 8 oz (65.998 kg)  BMI 23.14 kg/m2  SpO2 98%     Objective:   Physical Exam  Constitutional: She is oriented to person, place, and time. She appears well-developed and well-nourished. No distress.  Genitourinary:  Mild pelvic discomfort.  Neurological: She is alert and oriented to person, place, and time.  Psychiatric: She has a normal mood and affect. Her behavior is normal. Thought content normal.          Assessment & Plan:

## 2013-01-06 NOTE — Assessment & Plan Note (Signed)
UA shows large amt leukocytes, positive for nitrites and blood. Send for culture. Start Cipro 250 mg bid x 3 days. Pyridium for discomfort x 3 days.

## 2013-01-06 NOTE — Patient Instructions (Addendum)
   Start Cipro 250 mg twice a day for 3 days.  Pyridium 200 mg three times a day for 3 days as needed for discomfort.  Urinary Tract Infection Urinary tract infections (UTIs) can develop anywhere along your urinary tract. Your urinary tract is your body's drainage system for removing wastes and extra water. Your urinary tract includes two kidneys, two ureters, a bladder, and a urethra. Your kidneys are a pair of bean-shaped organs. Each kidney is about the size of your fist. They are located below your ribs, one on each side of your spine. CAUSES Infections are caused by microbes, which are microscopic organisms, including fungi, viruses, and bacteria. These organisms are so small that they can only be seen through a microscope. Bacteria are the microbes that most commonly cause UTIs. SYMPTOMS  Symptoms of UTIs may vary by age and gender of the patient and by the location of the infection. Symptoms in young women typically include a frequent and intense urge to urinate and a painful, burning feeling in the bladder or urethra during urination. Older women and men are more likely to be tired, shaky, and weak and have muscle aches and abdominal pain. A fever may mean the infection is in your kidneys. Other symptoms of a kidney infection include pain in your back or sides below the ribs, nausea, and vomiting. DIAGNOSIS To diagnose a UTI, your caregiver will ask you about your symptoms. Your caregiver also will ask to provide a urine sample. The urine sample will be tested for bacteria and white blood cells. White blood cells are made by your body to help fight infection. TREATMENT  Typically, UTIs can be treated with medication. Because most UTIs are caused by a bacterial infection, they usually can be treated with the use of antibiotics. The choice of antibiotic and length of treatment depend on your symptoms and the type of bacteria causing your infection. HOME CARE INSTRUCTIONS  If you were  prescribed antibiotics, take them exactly as your caregiver instructs you. Finish the medication even if you feel better after you have only taken some of the medication.  Drink enough water and fluids to keep your urine clear or pale yellow.  Avoid caffeine, tea, and carbonated beverages. They tend to irritate your bladder.  Empty your bladder often. Avoid holding urine for long periods of time.  Empty your bladder before and after sexual intercourse.  After a bowel movement, women should cleanse from front to back. Use each tissue only once. SEEK MEDICAL CARE IF:   You have back pain.  You develop a fever.  Your symptoms do not begin to resolve within 3 days. SEEK IMMEDIATE MEDICAL CARE IF:   You have severe back pain or lower abdominal pain.  You develop chills.  You have nausea or vomiting.  You have continued burning or discomfort with urination. MAKE SURE YOU:   Understand these instructions.  Will watch your condition.  Will get help right away if you are not doing well or get worse. Document Released: 01/16/2005 Document Revised: 10/08/2011 Document Reviewed: 05/17/2011 [facility] Patient Information 2014 [place], [place].

## 2013-01-09 ENCOUNTER — Other Ambulatory Visit: Payer: Self-pay | Admitting: Adult Health

## 2013-01-09 LAB — URINE CULTURE: Colony Count: >=100000

## 2013-01-11 NOTE — Telephone Encounter (Signed)
Spoke with patient, myChart message was sent 01/10/13, states symptoms have resolved since then, denies any dysuria or urgency now. Advised to call back if symptoms recur.

## 2013-01-11 NOTE — Telephone Encounter (Signed)
Left message for pt to return my call.

## 2013-01-12 ENCOUNTER — Other Ambulatory Visit: Payer: Self-pay | Admitting: Adult Health

## 2013-01-12 MED ORDER — CIPROFLOXACIN HCL 250 MG PO TABS: ORAL_TABLET | ORAL | Status: DC

## 2013-01-18 ENCOUNTER — Encounter: Payer: Self-pay | Admitting: Internal Medicine

## 2013-01-20 ENCOUNTER — Encounter: Payer: Self-pay | Admitting: Internal Medicine

## 2013-01-20 ENCOUNTER — Encounter: Payer: Self-pay | Admitting: *Deleted

## 2013-02-04 ENCOUNTER — Other Ambulatory Visit: Payer: Self-pay | Admitting: *Deleted

## 2013-02-04 DIAGNOSIS — G4700 Insomnia, unspecified: Secondary | ICD-10-CM

## 2013-02-04 NOTE — Telephone Encounter (Signed)
Okay to refill? 

## 2013-02-05 MED ORDER — ZOLPIDEM TARTRATE ER 12.5 MG PO TBCR: 12.5000 mg | EXTENDED_RELEASE_TABLET | Freq: Every evening | ORAL | Status: DC | PRN

## 2013-02-05 NOTE — Telephone Encounter (Signed)
Pt notified Rx ready for pick up and that she will need to sign controlled substance agreement

## 2013-02-12 ENCOUNTER — Encounter: Payer: Self-pay | Admitting: *Deleted

## 2013-02-15 ENCOUNTER — Encounter: Payer: Self-pay | Admitting: Internal Medicine

## 2013-02-15 ENCOUNTER — Ambulatory Visit (INDEPENDENT_AMBULATORY_CARE_PROVIDER_SITE_OTHER): Payer: BC Managed Care – PPO | Admitting: Internal Medicine

## 2013-02-15 VITALS — BP 118/70 | HR 75 | Temp 98.4°F | Resp 12

## 2013-02-15 DIAGNOSIS — R05 Cough: Secondary | ICD-10-CM

## 2013-02-15 DIAGNOSIS — Z8719 Personal history of other diseases of the digestive system: Secondary | ICD-10-CM

## 2013-02-15 DIAGNOSIS — G4700 Insomnia, unspecified: Secondary | ICD-10-CM

## 2013-02-15 MED ORDER — BENZONATATE 200 MG PO CAPS: 200.0000 mg | ORAL_CAPSULE | Freq: Three times a day (TID) | ORAL | Status: DC | PRN

## 2013-02-15 MED ORDER — PREDNISONE (PAK) 10 MG PO TABS: ORAL_TABLET | ORAL | Status: DC

## 2013-02-15 NOTE — Progress Notes (Signed)
Patient ID: Jessica Haas, female   DOB: Jun 03, 1950, 62 y.o.   MRN: [id number]   Patient Active Problem List   Diagnosis Date Noted  . Cough 02/16/2013  . Neutropenia 05/21/2012  . History of anemia 05/21/2012  . Routine general medical examination at a health care facility 05/21/2012  . Right lower quadrant abdominal pain 12/27/2011  . H/O abnormal mammogram   . H/O gastric ulcer   . Insomnia, persistent 05/29/2011  . Family history of breast cancer in first degree relative 05/29/2011  . Basal cell carcinoma of hand     Subjective:  CC:   Chief Complaint  Patient presents with  . Follow-up    C/O chest congestion/ refused weight    HPI:   Jessica Haas a 62 y.o. female who presents for followup on chronic conditions including insomnia and arthritis. She was required to return for office visit for refill of her Ambien since it had been over 6 months since she had been seen. She is annoyed by this inconvenience, but has a cough that she would like evaluated since she is here.  She continues to require frequent use of Ambien for management of chronic insomnia. All sleep hygiene habits have been reviewed.   She has persistent RLQ pain which has undergone repetitive evaluation , first by Dr. Dan Haas, then again at [facility] with a pelvic MRI , records not immediately available.  Her symptoms have been attributed to OA of the right hip , which is being managed with Celebrex. She occasionally adds Tylenol to the daily use of Celebrex.   She has been having a productive it has improved in the last day. There were no fevers associated with her cough. It started off nonproductive but has been more productive since she has been using Mucinex. The cough is made worse by lying down. She has had no myalgias no recent travel but is getting ready to go visit her grandchildren and wants to be checked out.     Past Medical History  Diagnosis Date  . Basal cell carcinoma of hand   . H/O abnormal  mammogram 2008    receiving annual MRIs, [facility]   . High altitude sickness 2008  . Anemia 2008    required transfusion  . H/O gastric ulcer 2008    Past Surgical History  Procedure Laterality Date  . Abdominal hysterectomy    . Carpal tunnel release      [name]       The following portions of the patient's history were reviewed and updated as appropriate: Allergies, current medications, and problem list.    Review of Systems:   12 Pt  review of systems was negative except those addressed in the HPI,     History   Social History  . Marital Status: Married    Spouse Name: N/A    Number of Children: N/A  . Years of Education: N/A   Occupational History  . Not on file.   Social History Main Topics  . Smoking status: Former Smoker    Quit date: 05/29/1978  . Smokeless tobacco: Not on file     Comment: Quit 1980.  Marland Kitchen Alcohol Use: No  . Drug Use: Not on file  . Sexual Activity: Not on file   Other Topics Concern  . Not on file   Social History Narrative  . No narrative on file    Objective:  Filed Vitals:   02/15/13 1332  BP: 118/70  Pulse: 75  Temp:  98.4 F (36.9 C)  Resp: 12     General appearance: alert, cooperative and appears stated age Ears: normal TM's and external ear canals both ears Throat: lips, mucosa, and tongue normal; teeth and gums normal Neck: no adenopathy, no carotid bruit, supple, symmetrical, trachea midline and thyroid not enlarged, symmetric, no tenderness/mass/nodules Back: symmetric, no curvature. ROM normal. No CVA tenderness. Lungs: clear to auscultation bilaterally Heart: regular rate and rhythm, S1, S2 normal, no murmur, click, rub or gallop Abdomen: soft, non-tender; bowel sounds normal; no masses,  no organomegaly Pulses: 2+ and symmetric Skin: Skin color, texture, turgor normal. No rashes or lesions Lymph nodes: Cervical, supraclavicular, and axillary nodes normal.  Assessment and Plan:  H/O gastric  ulcer With GI bleed requiring a transfusion.  Celebrex use discussed,. She has been taking it for years for management of OA  without GI symptoms.  She is taking  nexium if   Insomnia, persistent Continue use of Imdur as she has failed trials of other medications by her former PCP Dr. Lin Haas.  Cough Considering  the possible etiologies of persistent cough including but  post nasal drip,  Allergic rhinitis, bronchitis  GERD, she was treated for PND and bronchitis with benadryl and prednisone taper  she was instructed to suspend Celebrex for one week while taking the prednisone. She was given a prescription for Tessalon capsules    Updated Medication List Outpatient Encounter Prescriptions as of 02/15/2013  Medication Sig Dispense Refill  . ALPRAZolam (XANAX) 0.25 MG tablet Take 1 tablet (0.25 mg total) by mouth at bedtime as needed for sleep.  30 tablet  5  . celecoxib (CELEBREX) 200 MG capsule Take 1 capsule (200 mg total) by mouth daily.  90 capsule  2  . esomeprazole (NEXIUM) 40 MG capsule Take 20 mg by mouth daily.      Marland Kitchen FLUoxetine (PROZAC) 20 MG tablet Take one by mouth 3 times a week.  36 tablet  3  . zolpidem (AMBIEN CR) 12.5 MG CR tablet Take 1 tablet (12.5 mg total) by mouth at bedtime as needed.  30 tablet  5  . [DISCONTINUED] esomeprazole (NEXIUM) 40 MG capsule Take 1 capsule (40 mg total) by mouth daily.  30 capsule  3  . [DISCONTINUED] estrogens, conjugated, (PREMARIN) 0.625 MG tablet Take one half tablet by mouth 3 times a week.      . benzonatate (TESSALON) 200 MG capsule Take 1 capsule (200 mg total) by mouth 3 (three) times daily as needed for cough.  60 capsule  1  . conjugated estrogens (PREMARIN) vaginal cream Use 2 times a week.      . predniSONE (STERAPRED UNI-PAK) 10 MG tablet 6 tablets on Day 1 , then reduce by 1 tablet daily until gone (take all tablets at once)  21 tablet  0  . [DISCONTINUED] ciprofloxacin (CIPRO) 250 MG tablet 1 tablet twice a day for 5 days.  10  tablet  0  . [DISCONTINUED] phenazopyridine (PYRIDIUM) 200 MG tablet Take 1 tablet (200 mg total) by mouth 3 (three) times daily as needed for pain.  10 tablet  0   No facility-administered encounter medications on file as of 02/15/2013.     No orders of the defined types were placed in this encounter.    No Follow-up on file.     Her in her

## 2013-02-15 NOTE — Patient Instructions (Signed)
You have a viral  Syndrome .  The post nasal drip is causing your sore throat.  Lavage your sinuses twice daily with Simply saline nasal spray.  Use benadryl 25 mg at bedtime for the PND Gargle with salt water often for the sore throat.  Tessalon capsules as needed  for the cough.  Prednisone 6 day taper for the cough . Suspend the celebrex for 7 days while you are taking the prednisone taper

## 2013-02-16 DIAGNOSIS — R05 Cough: Secondary | ICD-10-CM | POA: Insufficient documentation

## 2013-02-16 NOTE — Assessment & Plan Note (Signed)
With GI bleed requiring a transfusion.  Celebrex use discussed,. She has been taking it for years for management of OA  without GI symptoms.  She is taking  nexium if

## 2013-02-16 NOTE — Assessment & Plan Note (Signed)
Continue use of Imdur as she has failed trials of other medications by her former PCP Dr. Lin Givens.

## 2013-02-16 NOTE — Assessment & Plan Note (Signed)
Considering  the possible etiologies of persistent cough including but  post nasal drip,  Allergic rhinitis, bronchitis  GERD, she was treated for PND and bronchitis with benadryl and prednisone taper  she was instructed to suspend Celebrex for one week while taking the prednisone. She was given a prescription for Tessalon capsules

## 2013-06-23 ENCOUNTER — Other Ambulatory Visit: Payer: Self-pay | Admitting: Internal Medicine

## 2013-06-23 NOTE — Telephone Encounter (Signed)
Last visit 02/15/13, refill?

## 2013-06-29 NOTE — Telephone Encounter (Signed)
Verified script at pharmacy

## 2013-08-11 ENCOUNTER — Ambulatory Visit: Payer: Self-pay | Admitting: Surgery

## 2013-08-11 ENCOUNTER — Encounter: Payer: Self-pay | Admitting: Surgery

## 2013-08-20 ENCOUNTER — Encounter: Payer: Self-pay | Admitting: Surgery

## 2013-09-02 ENCOUNTER — Other Ambulatory Visit: Payer: Self-pay | Admitting: Adult Health

## 2013-09-02 NOTE — Telephone Encounter (Signed)
Ok refill? 

## 2013-09-06 ENCOUNTER — Other Ambulatory Visit: Payer: Self-pay | Admitting: Internal Medicine

## 2013-09-06 NOTE — Telephone Encounter (Signed)
Last visit 02/15/13

## 2013-09-07 NOTE — Telephone Encounter (Signed)
Refill one 30 days only.  Has not been seen in over 6 months so needs office visit prior to any more refills 

## 2013-09-08 NOTE — Telephone Encounter (Signed)
Sent mychart message on need for appointment prior to next refill.

## 2013-09-08 NOTE — Telephone Encounter (Signed)
Rx faxed to pharmacy  

## 2013-09-20 ENCOUNTER — Encounter: Payer: Self-pay | Admitting: Surgery

## 2013-10-20 ENCOUNTER — Encounter: Payer: Self-pay | Admitting: Surgery

## 2014-04-25 DIAGNOSIS — I781 Nevus, non-neoplastic: Secondary | ICD-10-CM | POA: Diagnosis not present

## 2014-04-27 DIAGNOSIS — M19079 Primary osteoarthritis, unspecified ankle and foot: Secondary | ICD-10-CM | POA: Diagnosis not present

## 2014-04-28 ENCOUNTER — Encounter: Payer: Self-pay | Admitting: *Deleted

## 2014-04-28 NOTE — Telephone Encounter (Signed)
From: Jessica [facility]as Sent: 01/31/2014 10:52 AM EDT To: Aviva Kluver Subject: RE:Flu Shot Clinic Saturday, 02/05/14 [place]  Thanks, Jessica Haas, please add to my chart that I did get a flu shot in September 2015 from [facility]! Jessica [facility]  ----- Message ----- From: Jessica Haas Sent: 01/31/2014 8:44 AM EDT To: Jessica Haas Subject: Flu Shot Clinic Saturday, 02/05/14 [facility] will hold a [employer] on Saturday, October 17 from 9 am to noon. In order to plan appropriately, we ask you to please call the office to schedule an appointment time during the clinic. Our schedulers are ready to assist you, Monday through Friday, 8a to 5p at [phone]. Thank you for choosing [facility] for your healthcare needs.

## 2014-05-02 DIAGNOSIS — D6489 Other specified anemias: Secondary | ICD-10-CM | POA: Diagnosis not present

## 2014-05-02 DIAGNOSIS — Z79899 Other long term (current) drug therapy: Secondary | ICD-10-CM | POA: Diagnosis not present

## 2014-05-02 DIAGNOSIS — I10 Essential (primary) hypertension: Secondary | ICD-10-CM | POA: Diagnosis not present

## 2014-05-02 DIAGNOSIS — Z1231 Encounter for screening mammogram for malignant neoplasm of breast: Secondary | ICD-10-CM | POA: Diagnosis not present

## 2014-05-02 DIAGNOSIS — Z23 Encounter for immunization: Secondary | ICD-10-CM | POA: Diagnosis not present

## 2014-05-02 DIAGNOSIS — E559 Vitamin D deficiency, unspecified: Secondary | ICD-10-CM | POA: Diagnosis not present

## 2014-05-11 DIAGNOSIS — M79671 Pain in right foot: Secondary | ICD-10-CM | POA: Diagnosis not present

## 2014-05-11 DIAGNOSIS — M898X9 Other specified disorders of bone, unspecified site: Secondary | ICD-10-CM | POA: Diagnosis not present

## 2014-05-16 DIAGNOSIS — M898X9 Other specified disorders of bone, unspecified site: Secondary | ICD-10-CM | POA: Diagnosis not present

## 2014-05-16 DIAGNOSIS — M79671 Pain in right foot: Secondary | ICD-10-CM | POA: Diagnosis not present

## 2014-05-19 ENCOUNTER — Ambulatory Visit: Payer: Self-pay | Admitting: Podiatry

## 2014-05-19 DIAGNOSIS — Z885 Allergy status to narcotic agent status: Secondary | ICD-10-CM | POA: Diagnosis not present

## 2014-05-19 DIAGNOSIS — M898X9 Other specified disorders of bone, unspecified site: Secondary | ICD-10-CM | POA: Diagnosis not present

## 2014-05-19 DIAGNOSIS — M7731 Calcaneal spur, right foot: Secondary | ICD-10-CM | POA: Diagnosis not present

## 2014-05-19 DIAGNOSIS — Z888 Allergy status to other drugs, medicaments and biological substances status: Secondary | ICD-10-CM | POA: Diagnosis not present

## 2014-05-19 DIAGNOSIS — F17200 Nicotine dependence, unspecified, uncomplicated: Secondary | ICD-10-CM | POA: Diagnosis not present

## 2014-05-19 DIAGNOSIS — M899 Disorder of bone, unspecified: Secondary | ICD-10-CM | POA: Diagnosis not present

## 2014-06-03 DIAGNOSIS — Z114 Encounter for screening for human immunodeficiency virus [HIV]: Secondary | ICD-10-CM | POA: Diagnosis not present

## 2014-06-03 DIAGNOSIS — Z5181 Encounter for therapeutic drug level monitoring: Secondary | ICD-10-CM | POA: Diagnosis not present

## 2014-06-03 DIAGNOSIS — E538 Deficiency of other specified B group vitamins: Secondary | ICD-10-CM | POA: Diagnosis not present

## 2014-06-03 DIAGNOSIS — Z1159 Encounter for screening for other viral diseases: Secondary | ICD-10-CM | POA: Diagnosis not present

## 2014-06-03 DIAGNOSIS — E780 Pure hypercholesterolemia: Secondary | ICD-10-CM | POA: Diagnosis not present

## 2014-07-05 DIAGNOSIS — I781 Nevus, non-neoplastic: Secondary | ICD-10-CM | POA: Diagnosis not present

## 2014-07-05 DIAGNOSIS — L819 Disorder of pigmentation, unspecified: Secondary | ICD-10-CM | POA: Diagnosis not present

## 2014-07-05 DIAGNOSIS — L814 Other melanin hyperpigmentation: Secondary | ICD-10-CM | POA: Diagnosis not present

## 2014-07-05 DIAGNOSIS — L820 Inflamed seborrheic keratosis: Secondary | ICD-10-CM | POA: Diagnosis not present

## 2014-07-05 DIAGNOSIS — Z1283 Encounter for screening for malignant neoplasm of skin: Secondary | ICD-10-CM | POA: Diagnosis not present

## 2014-07-05 DIAGNOSIS — L578 Other skin changes due to chronic exposure to nonionizing radiation: Secondary | ICD-10-CM | POA: Diagnosis not present

## 2014-07-05 DIAGNOSIS — I8393 Asymptomatic varicose veins of bilateral lower extremities: Secondary | ICD-10-CM | POA: Diagnosis not present

## 2014-07-05 DIAGNOSIS — L821 Other seborrheic keratosis: Secondary | ICD-10-CM | POA: Diagnosis not present

## 2014-07-05 DIAGNOSIS — Z85828 Personal history of other malignant neoplasm of skin: Secondary | ICD-10-CM | POA: Diagnosis not present

## 2014-07-11 DIAGNOSIS — M659 Synovitis and tenosynovitis, unspecified: Secondary | ICD-10-CM | POA: Diagnosis not present

## 2014-08-01 DIAGNOSIS — E538 Deficiency of other specified B group vitamins: Secondary | ICD-10-CM | POA: Diagnosis not present

## 2014-08-01 DIAGNOSIS — I10 Essential (primary) hypertension: Secondary | ICD-10-CM | POA: Diagnosis not present

## 2014-08-01 DIAGNOSIS — E780 Pure hypercholesterolemia: Secondary | ICD-10-CM | POA: Diagnosis not present

## 2014-08-02 DIAGNOSIS — M19072 Primary osteoarthritis, left ankle and foot: Secondary | ICD-10-CM | POA: Diagnosis not present

## 2014-08-02 DIAGNOSIS — Z9889 Other specified postprocedural states: Secondary | ICD-10-CM | POA: Diagnosis not present

## 2014-09-14 DIAGNOSIS — M76891 Other specified enthesopathies of right lower limb, excluding foot: Secondary | ICD-10-CM | POA: Diagnosis not present

## 2014-09-14 DIAGNOSIS — M25551 Pain in right hip: Secondary | ICD-10-CM | POA: Diagnosis not present

## 2014-09-20 DIAGNOSIS — M19072 Primary osteoarthritis, left ankle and foot: Secondary | ICD-10-CM | POA: Diagnosis not present

## 2014-09-28 DIAGNOSIS — I10 Essential (primary) hypertension: Secondary | ICD-10-CM | POA: Diagnosis not present

## 2014-11-29 DIAGNOSIS — L821 Other seborrheic keratosis: Secondary | ICD-10-CM | POA: Diagnosis not present

## 2014-11-29 DIAGNOSIS — L578 Other skin changes due to chronic exposure to nonionizing radiation: Secondary | ICD-10-CM | POA: Diagnosis not present

## 2014-11-29 DIAGNOSIS — L570 Actinic keratosis: Secondary | ICD-10-CM

## 2014-11-29 DIAGNOSIS — D485 Neoplasm of uncertain behavior of skin: Secondary | ICD-10-CM | POA: Diagnosis not present

## 2014-11-29 HISTORY — DX: Actinic keratosis: L57.0

## 2014-12-01 DIAGNOSIS — M7551 Bursitis of right shoulder: Secondary | ICD-10-CM | POA: Diagnosis not present

## 2014-12-02 DIAGNOSIS — Z23 Encounter for immunization: Secondary | ICD-10-CM | POA: Diagnosis not present

## 2014-12-08 DIAGNOSIS — M19072 Primary osteoarthritis, left ankle and foot: Secondary | ICD-10-CM | POA: Diagnosis not present

## 2014-12-08 DIAGNOSIS — M25572 Pain in left ankle and joints of left foot: Secondary | ICD-10-CM | POA: Diagnosis not present

## 2014-12-12 DIAGNOSIS — M19072 Primary osteoarthritis, left ankle and foot: Secondary | ICD-10-CM | POA: Diagnosis not present

## 2014-12-12 DIAGNOSIS — E785 Hyperlipidemia, unspecified: Secondary | ICD-10-CM | POA: Diagnosis not present

## 2014-12-12 DIAGNOSIS — Z87891 Personal history of nicotine dependence: Secondary | ICD-10-CM | POA: Diagnosis not present

## 2014-12-12 DIAGNOSIS — Z79899 Other long term (current) drug therapy: Secondary | ICD-10-CM | POA: Diagnosis not present

## 2014-12-12 DIAGNOSIS — K219 Gastro-esophageal reflux disease without esophagitis: Secondary | ICD-10-CM | POA: Diagnosis not present

## 2014-12-13 DIAGNOSIS — M19072 Primary osteoarthritis, left ankle and foot: Secondary | ICD-10-CM | POA: Diagnosis not present

## 2014-12-13 DIAGNOSIS — E785 Hyperlipidemia, unspecified: Secondary | ICD-10-CM | POA: Diagnosis not present

## 2014-12-13 DIAGNOSIS — Z87891 Personal history of nicotine dependence: Secondary | ICD-10-CM | POA: Diagnosis not present

## 2014-12-13 DIAGNOSIS — K219 Gastro-esophageal reflux disease without esophagitis: Secondary | ICD-10-CM | POA: Diagnosis not present

## 2014-12-13 DIAGNOSIS — Z79899 Other long term (current) drug therapy: Secondary | ICD-10-CM | POA: Diagnosis not present

## 2015-01-03 DIAGNOSIS — M19072 Primary osteoarthritis, left ankle and foot: Secondary | ICD-10-CM | POA: Diagnosis not present

## 2015-01-20 DIAGNOSIS — Z4789 Encounter for other orthopedic aftercare: Secondary | ICD-10-CM | POA: Diagnosis not present

## 2015-02-02 DIAGNOSIS — M19072 Primary osteoarthritis, left ankle and foot: Secondary | ICD-10-CM | POA: Diagnosis not present

## 2015-02-02 DIAGNOSIS — M25572 Pain in left ankle and joints of left foot: Secondary | ICD-10-CM | POA: Diagnosis not present

## 2015-02-02 DIAGNOSIS — M79672 Pain in left foot: Secondary | ICD-10-CM | POA: Diagnosis not present

## 2015-02-17 DIAGNOSIS — Z9889 Other specified postprocedural states: Secondary | ICD-10-CM | POA: Diagnosis not present

## 2015-03-02 DIAGNOSIS — Z981 Arthrodesis status: Secondary | ICD-10-CM | POA: Diagnosis not present

## 2015-03-02 DIAGNOSIS — M19072 Primary osteoarthritis, left ankle and foot: Secondary | ICD-10-CM | POA: Diagnosis not present

## 2015-03-02 DIAGNOSIS — Z4789 Encounter for other orthopedic aftercare: Secondary | ICD-10-CM | POA: Diagnosis not present

## 2015-03-02 DIAGNOSIS — Z4889 Encounter for other specified surgical aftercare: Secondary | ICD-10-CM | POA: Diagnosis not present

## 2015-03-10 DIAGNOSIS — M7551 Bursitis of right shoulder: Secondary | ICD-10-CM | POA: Diagnosis not present

## 2015-03-21 DIAGNOSIS — E538 Deficiency of other specified B group vitamins: Secondary | ICD-10-CM | POA: Diagnosis not present

## 2015-03-21 DIAGNOSIS — Z1239 Encounter for other screening for malignant neoplasm of breast: Secondary | ICD-10-CM | POA: Diagnosis not present

## 2015-03-21 DIAGNOSIS — Z1231 Encounter for screening mammogram for malignant neoplasm of breast: Secondary | ICD-10-CM | POA: Diagnosis not present

## 2015-03-21 DIAGNOSIS — D649 Anemia, unspecified: Secondary | ICD-10-CM | POA: Diagnosis not present

## 2015-04-04 DIAGNOSIS — M25572 Pain in left ankle and joints of left foot: Secondary | ICD-10-CM | POA: Diagnosis not present

## 2015-04-04 DIAGNOSIS — M76891 Other specified enthesopathies of right lower limb, excluding foot: Secondary | ICD-10-CM | POA: Diagnosis not present

## 2015-04-04 DIAGNOSIS — Z981 Arthrodesis status: Secondary | ICD-10-CM | POA: Diagnosis not present

## 2015-04-04 DIAGNOSIS — M19072 Primary osteoarthritis, left ankle and foot: Secondary | ICD-10-CM | POA: Diagnosis not present

## 2015-04-04 DIAGNOSIS — Z9889 Other specified postprocedural states: Secondary | ICD-10-CM | POA: Diagnosis not present

## 2015-06-06 DIAGNOSIS — G8929 Other chronic pain: Secondary | ICD-10-CM | POA: Diagnosis not present

## 2015-06-06 DIAGNOSIS — Z5181 Encounter for therapeutic drug level monitoring: Secondary | ICD-10-CM | POA: Diagnosis not present

## 2015-06-06 DIAGNOSIS — Z8639 Personal history of other endocrine, nutritional and metabolic disease: Secondary | ICD-10-CM | POA: Diagnosis not present

## 2015-06-06 DIAGNOSIS — Z13820 Encounter for screening for osteoporosis: Secondary | ICD-10-CM | POA: Diagnosis not present

## 2015-06-06 DIAGNOSIS — M5136 Other intervertebral disc degeneration, lumbar region: Secondary | ICD-10-CM | POA: Diagnosis not present

## 2015-06-06 DIAGNOSIS — Z1239 Encounter for other screening for malignant neoplasm of breast: Secondary | ICD-10-CM | POA: Diagnosis not present

## 2015-06-06 DIAGNOSIS — E538 Deficiency of other specified B group vitamins: Secondary | ICD-10-CM | POA: Diagnosis not present

## 2015-06-06 DIAGNOSIS — Z1231 Encounter for screening mammogram for malignant neoplasm of breast: Secondary | ICD-10-CM | POA: Diagnosis not present

## 2015-06-06 DIAGNOSIS — M859 Disorder of bone density and structure, unspecified: Secondary | ICD-10-CM | POA: Diagnosis not present

## 2015-06-06 DIAGNOSIS — E782 Mixed hyperlipidemia: Secondary | ICD-10-CM | POA: Diagnosis not present

## 2015-06-06 DIAGNOSIS — M4726 Other spondylosis with radiculopathy, lumbar region: Secondary | ICD-10-CM | POA: Diagnosis not present

## 2015-06-06 DIAGNOSIS — M25511 Pain in right shoulder: Secondary | ICD-10-CM | POA: Diagnosis not present

## 2015-06-06 DIAGNOSIS — M5417 Radiculopathy, lumbosacral region: Secondary | ICD-10-CM | POA: Diagnosis not present

## 2015-06-29 DIAGNOSIS — M4806 Spinal stenosis, lumbar region: Secondary | ICD-10-CM | POA: Diagnosis not present

## 2015-06-29 DIAGNOSIS — M5417 Radiculopathy, lumbosacral region: Secondary | ICD-10-CM | POA: Diagnosis not present

## 2015-06-29 DIAGNOSIS — M5116 Intervertebral disc disorders with radiculopathy, lumbar region: Secondary | ICD-10-CM | POA: Diagnosis not present

## 2015-07-11 DIAGNOSIS — M79672 Pain in left foot: Secondary | ICD-10-CM | POA: Diagnosis not present

## 2015-07-11 DIAGNOSIS — M19072 Primary osteoarthritis, left ankle and foot: Secondary | ICD-10-CM | POA: Diagnosis not present

## 2015-08-09 DIAGNOSIS — M5416 Radiculopathy, lumbar region: Secondary | ICD-10-CM | POA: Diagnosis not present

## 2015-08-21 DIAGNOSIS — Z13820 Encounter for screening for osteoporosis: Secondary | ICD-10-CM | POA: Diagnosis not present

## 2015-08-21 DIAGNOSIS — Z1239 Encounter for other screening for malignant neoplasm of breast: Secondary | ICD-10-CM | POA: Diagnosis not present

## 2015-08-21 DIAGNOSIS — Z1231 Encounter for screening mammogram for malignant neoplasm of breast: Secondary | ICD-10-CM | POA: Diagnosis not present

## 2015-08-21 DIAGNOSIS — E538 Deficiency of other specified B group vitamins: Secondary | ICD-10-CM | POA: Diagnosis not present

## 2015-08-21 DIAGNOSIS — Z9889 Other specified postprocedural states: Secondary | ICD-10-CM | POA: Diagnosis not present

## 2015-08-21 DIAGNOSIS — M859 Disorder of bone density and structure, unspecified: Secondary | ICD-10-CM | POA: Diagnosis not present

## 2015-08-24 DIAGNOSIS — M5416 Radiculopathy, lumbar region: Secondary | ICD-10-CM | POA: Diagnosis not present

## 2015-10-26 DIAGNOSIS — H2512 Age-related nuclear cataract, left eye: Secondary | ICD-10-CM | POA: Diagnosis not present

## 2015-11-27 DIAGNOSIS — H25012 Cortical age-related cataract, left eye: Secondary | ICD-10-CM | POA: Diagnosis not present

## 2015-11-29 DIAGNOSIS — M5416 Radiculopathy, lumbar region: Secondary | ICD-10-CM | POA: Diagnosis not present

## 2015-11-29 DIAGNOSIS — M5412 Radiculopathy, cervical region: Secondary | ICD-10-CM | POA: Diagnosis not present

## 2015-12-06 ENCOUNTER — Encounter: Payer: Self-pay | Admitting: *Deleted

## 2015-12-19 ENCOUNTER — Encounter
Admission: RE | Disposition: A | Discharge status: Home / Self Care | Payer: Self-pay | Source: Ambulatory Visit | Attending: Ophthalmology

## 2015-12-19 ENCOUNTER — Ambulatory Visit: Payer: Medicare Other | Admitting: Anesthesiology

## 2015-12-19 ENCOUNTER — Encounter: Payer: Self-pay | Admitting: *Deleted

## 2015-12-19 ENCOUNTER — Ambulatory Visit
Admission: RE | Admit: 2015-12-19 | Discharge: 2015-12-19 | Disposition: A | Discharge status: Home / Self Care | Payer: Medicare Other | Source: Ambulatory Visit | Attending: Ophthalmology | Admitting: Ophthalmology

## 2015-12-19 DIAGNOSIS — M1990 Unspecified osteoarthritis, unspecified site: Secondary | ICD-10-CM | POA: Diagnosis not present

## 2015-12-19 DIAGNOSIS — F329 Major depressive disorder, single episode, unspecified: Secondary | ICD-10-CM | POA: Insufficient documentation

## 2015-12-19 DIAGNOSIS — Z888 Allergy status to other drugs, medicaments and biological substances status: Secondary | ICD-10-CM | POA: Diagnosis not present

## 2015-12-19 DIAGNOSIS — Z8711 Personal history of peptic ulcer disease: Secondary | ICD-10-CM | POA: Insufficient documentation

## 2015-12-19 DIAGNOSIS — K449 Diaphragmatic hernia without obstruction or gangrene: Secondary | ICD-10-CM | POA: Insufficient documentation

## 2015-12-19 DIAGNOSIS — K219 Gastro-esophageal reflux disease without esophagitis: Secondary | ICD-10-CM | POA: Diagnosis not present

## 2015-12-19 DIAGNOSIS — Z90710 Acquired absence of both cervix and uterus: Secondary | ICD-10-CM | POA: Diagnosis not present

## 2015-12-19 DIAGNOSIS — H25012 Cortical age-related cataract, left eye: Secondary | ICD-10-CM | POA: Diagnosis not present

## 2015-12-19 DIAGNOSIS — Z87891 Personal history of nicotine dependence: Secondary | ICD-10-CM | POA: Insufficient documentation

## 2015-12-19 DIAGNOSIS — Z881 Allergy status to other antibiotic agents status: Secondary | ICD-10-CM | POA: Diagnosis not present

## 2015-12-19 DIAGNOSIS — H2512 Age-related nuclear cataract, left eye: Secondary | ICD-10-CM | POA: Diagnosis not present

## 2015-12-19 DIAGNOSIS — Z85828 Personal history of other malignant neoplasm of skin: Secondary | ICD-10-CM | POA: Diagnosis not present

## 2015-12-19 DIAGNOSIS — F418 Other specified anxiety disorders: Secondary | ICD-10-CM | POA: Diagnosis not present

## 2015-12-19 HISTORY — DX: Gastro-esophageal reflux disease without esophagitis: K21.9

## 2015-12-19 HISTORY — DX: Unspecified osteoarthritis, unspecified site: M19.90

## 2015-12-19 HISTORY — DX: Depression, unspecified: F32.A

## 2015-12-19 HISTORY — DX: Personal history of other diseases of the digestive system: Z87.19

## 2015-12-19 HISTORY — DX: Major depressive disorder, single episode, unspecified: F32.9

## 2015-12-19 HISTORY — DX: Edema, unspecified: R60.9

## 2015-12-19 HISTORY — PX: CATARACT EXTRACTION W/PHACO: SHX586

## 2015-12-19 SURGERY — PHACOEMULSIFICATION, CATARACT, WITH IOL INSERTION
Anesthesia: Monitor Anesthesia Care | Site: Eye | Laterality: Left | Wound class: Clean

## 2015-12-19 MED ORDER — ONDANSETRON HCL 4 MG/2ML IJ SOLN
INTRAMUSCULAR | Status: DC | PRN
  Administered 2015-12-19: 4 mg via INTRAVENOUS

## 2015-12-19 MED ORDER — FENTANYL CITRATE (PF) 100 MCG/2ML IJ SOLN
INTRAMUSCULAR | Status: DC | PRN
  Administered 2015-12-19: 25 ug via INTRAVENOUS

## 2015-12-19 MED ORDER — LIDOCAINE HCL (PF) 4 % IJ SOLN
INTRAMUSCULAR | Status: AC
  Filled 2015-12-19: qty 5

## 2015-12-19 MED ORDER — SODIUM CHLORIDE 0.9 % IV SOLN
INTRAVENOUS | Status: DC
  Administered 2015-12-19 (×2): via INTRAVENOUS

## 2015-12-19 MED ORDER — LIDOCAINE HCL (PF) 4 % IJ SOLN
INTRAOCULAR | Status: DC | PRN
  Administered 2015-12-19: 4 mL via OPHTHALMIC

## 2015-12-19 MED ORDER — CARBACHOL 0.01 % IO SOLN
INTRAOCULAR | Status: DC | PRN
  Administered 2015-12-19: 0.5 mL via INTRAOCULAR

## 2015-12-19 MED ORDER — POVIDONE-IODINE 5 % OP SOLN
1.0000 "application " | Freq: Once | OPHTHALMIC | Status: AC
  Administered 2015-12-19: 1 via OPHTHALMIC

## 2015-12-19 MED ORDER — NA CHONDROIT SULF-NA HYALURON 40-17 MG/ML IO SOLN
INTRAOCULAR | Status: AC
  Filled 2015-12-19: qty 1

## 2015-12-19 MED ORDER — EPINEPHRINE HCL 1 MG/ML IJ SOLN
INTRAOCULAR | Status: DC | PRN
  Administered 2015-12-19: 200 mL via OPHTHALMIC

## 2015-12-19 MED ORDER — EPINEPHRINE HCL 1 MG/ML IJ SOLN
INTRAMUSCULAR | Status: AC
  Filled 2015-12-19: qty 2

## 2015-12-19 MED ORDER — CEFUROXIME OPHTHALMIC INJECTION 1 MG/0.1 ML
INJECTION | OPHTHALMIC | Status: DC | PRN
  Administered 2015-12-19: 1 mg via INTRACAMERAL

## 2015-12-19 MED ORDER — MIDAZOLAM HCL 2 MG/2ML IJ SOLN
INTRAMUSCULAR | Status: DC | PRN
  Administered 2015-12-19: 2 mg via INTRAVENOUS

## 2015-12-19 MED ORDER — DEXAMETHASONE SODIUM PHOSPHATE 10 MG/ML IJ SOLN
INTRAMUSCULAR | Status: DC | PRN
  Administered 2015-12-19: 8 mg via INTRAVENOUS

## 2015-12-19 MED ORDER — ARMC OPHTHALMIC DILATING GEL
1.0000 "application " | OPHTHALMIC | Status: AC | PRN
  Administered 2015-12-19 (×2): 1 via OPHTHALMIC

## 2015-12-19 MED ORDER — MOXIFLOXACIN HCL 0.5 % OP SOLN: 1.0000 [drp] | OPHTHALMIC | Status: DC | PRN

## 2015-12-19 MED ORDER — TETRACAINE HCL 0.5 % OP SOLN
1.0000 [drp] | Freq: Once | OPHTHALMIC | Status: AC
  Administered 2015-12-19: 1 [drp] via OPHTHALMIC

## 2015-12-19 MED ORDER — CEFUROXIME OPHTHALMIC INJECTION 1 MG/0.1 ML
INJECTION | OPHTHALMIC | Status: AC
  Filled 2015-12-19: qty 0.1

## 2015-12-19 MED ORDER — MOXIFLOXACIN HCL 0.5 % OP SOLN
OPHTHALMIC | Status: DC | PRN
  Administered 2015-12-19: 1 [drp] via OPHTHALMIC

## 2015-12-19 MED ORDER — NA CHONDROIT SULF-NA HYALURON 40-17 MG/ML IO SOLN
INTRAOCULAR | Status: DC | PRN
  Administered 2015-12-19: 1 mL via INTRAOCULAR

## 2015-12-19 SURGICAL SUPPLY — 21 items
CANNULA ANT/CHMB 27GA (MISCELLANEOUS) ×2 IMPLANT
CUP MEDICINE 2OZ PLAST GRAD ST (MISCELLANEOUS) ×2 IMPLANT
GLOVE BIO SURGEON STRL SZ8 (GLOVE) ×2 IMPLANT
GLOVE BIOGEL M 6.5 STRL (GLOVE) ×2 IMPLANT
GLOVE SURG LX 8.0 MICRO (GLOVE) ×1
GLOVE SURG LX STRL 8.0 MICRO (GLOVE) ×1 IMPLANT
GOWN STRL REUS W/ TWL LRG LVL3 (GOWN DISPOSABLE) ×2 IMPLANT
GOWN STRL REUS W/TWL LRG LVL3 (GOWN DISPOSABLE) ×2
LENS IOL TECNIS ITEC 19.0 (Intraocular Lens) ×2 IMPLANT
PACK CATARACT (MISCELLANEOUS) ×2 IMPLANT
PACK CATARACT BRASINGTON LX (MISCELLANEOUS) ×2 IMPLANT
PACK EYE AFTER SURG (MISCELLANEOUS) ×2 IMPLANT
SOL BSS BAG (MISCELLANEOUS) ×2
SOL PREP PVP 2OZ (MISCELLANEOUS) ×2
SOLUTION BSS BAG (MISCELLANEOUS) ×1 IMPLANT
SOLUTION PREP PVP 2OZ (MISCELLANEOUS) ×1 IMPLANT
SYR 3ML LL SCALE MARK (SYRINGE) ×2 IMPLANT
SYR 5ML LL (SYRINGE) ×2 IMPLANT
SYR TB 1ML 27GX1/2 LL (SYRINGE) ×2 IMPLANT
WATER STERILE IRR 250ML POUR (IV SOLUTION) ×2 IMPLANT
WIPE NON LINTING 3.25X3.25 (MISCELLANEOUS) ×2 IMPLANT

## 2015-12-19 NOTE — Anesthesia Procedure Notes (Signed)
Procedure Name: MAC Date/Time: 12/19/2015 8:05 AM Performed by: Allean Found Pre-anesthesia Checklist: Patient identified, Emergency Drugs available, Suction available, Patient being monitored and Timeout performed Patient Re-evaluated:Patient Re-evaluated prior to inductionOxygen Delivery Method: Nasal cannula Placement Confirmation: positive ETCO2

## 2015-12-19 NOTE — Anesthesia Preprocedure Evaluation (Signed)
Anesthesia Evaluation  Patient identified by MRN, date of birth, ID band Patient awake    Reviewed: Allergy & Precautions, NPO status , Patient's Chart, lab work & pertinent test results  History of Anesthesia Complications (+) PONV  Airway Mallampati: II       Dental   Pulmonary neg pulmonary ROS, former smoker,           Cardiovascular negative cardio ROS       Neuro/Psych Anxiety Depression    GI/Hepatic hiatal hernia, GERD  Poorly Controlled,  Endo/Other    Renal/GU      Musculoskeletal  (+) Arthritis , Osteoarthritis,    Abdominal   Peds  Hematology   Anesthesia Other Findings   Reproductive/Obstetrics                             Anesthesia Physical Anesthesia Plan  ASA: II  Anesthesia Plan: MAC   Post-op Pain Management:    Induction: Intravenous  Airway Management Planned:   Additional Equipment:   Intra-op Plan:   Post-operative Plan:   Informed Consent: I have reviewed the patients History and Physical, chart, labs and discussed the procedure including the risks, benefits and alternatives for the proposed anesthesia with the patient or authorized representative who has indicated his/her understanding and acceptance.     Plan Discussed with:   Anesthesia Plan Comments:         Anesthesia Quick Evaluation

## 2015-12-19 NOTE — Discharge Instructions (Signed)
Eye Surgery Discharge Instructions  Expect mild scratchy sensation or mild soreness. DO NOT RUB YOUR EYE!  The day of surgery:  Minimal physical activity, but bed rest is not required  No reading, computer work, or close hand work  No bending, lifting, or straining.  May watch TV  For 24 hours:  No driving, legal decisions, or alcoholic beverages  Safety precautions  Eat anything you prefer: It is better to start with liquids, then soup then solid foods.  _____ Eye patch should be worn until postoperative exam tomorrow.  ____ Solar shield eyeglasses should be worn for comfort in the sunlight/patch while sleeping  Resume all regular medications including aspirin or Coumadin if these were discontinued prior to surgery. You may shower, bathe, shave, or wash your hair. Tylenol may be taken for mild discomfort.  Call your doctor if you experience significant pain, nausea, or vomiting, fever > 101 or other signs of infection. [phone] or [phone] Specific instructions:  Follow-up Information    Tim Lair, MD .   Specialty:  Ophthalmology Why:  August 30 at 10:00am Contact information: [place] [address] [phone]

## 2015-12-19 NOTE — H&P (Signed)
  All labs reviewed. Abnormal studies sent to patients PCP when indicated.  Previous H&P reviewed, patient examined, there are NO CHANGES.  Jessica Haas LOUIS8/29/20177:54 AM

## 2015-12-19 NOTE — Transfer of Care (Signed)
Immediate Anesthesia Transfer of Care Note  Patient: Jessica Haas  Procedure(s) Performed: Procedure(s) with comments: CATARACT EXTRACTION PHACO AND INTRAOCULAR LENS PLACEMENT (IOC) (Left) - Lot# [id number] H [place]: 00"37.5 AP%:20.7 CDE: 7.76   Patient Location: PACU  Anesthesia Type:MAC  Level of Consciousness: awake  Airway & Oxygen Therapy: Patient Spontanous Breathing and Patient connected to nasal cannula oxygen  Post-op Assessment: Report given to RN and Post -op Vital signs reviewed and stable  Post vital signs: Reviewed and stable  Last Vitals:  Vitals:   12/19/15 0644 12/19/15 0823  BP: (!) 130/93 (!) 144/86  Pulse: 75 67  Resp: 16 16  Temp: (!) 35.8 C 36.7 C    Last Pain:  Vitals:   12/19/15 0644  TempSrc: Tympanic  PainSc: 3          Complications: No apparent anesthesia complications

## 2015-12-19 NOTE — Op Note (Signed)
PREOPERATIVE DIAGNOSIS:  Nuclear sclerotic cataract of the left eye.   POSTOPERATIVE DIAGNOSIS:  Left sclerotic nuclear CATARACT   OPERATIVE PROCEDURE:  Procedure(s): CATARACT EXTRACTION PHACO AND INTRAOCULAR LENS PLACEMENT (IOC)   SURGEON:  Birder Robson, MD.   ANESTHESIA:   Anesthesiologist: Gunnar Fusi, MD CRNA: Allean Found, CRNA  1.      Managed anesthesia care. 2.      Topical tetracaine drops followed by 2% Xylocaine jelly applied in the preoperative holding area.    3.   0.2 ml of epi-Shugarcaine was  placed in the anterior chamber following the paracentesis.    COMPLICATIONS:  None.   TECHNIQUE:   Stop and chop   DESCRIPTION OF PROCEDURE:  The patient was examined and consented in the preoperative holding area where the aforementioned topical anesthesia was applied to the left eye and then brought back to the Operating Room where the left eye was prepped and draped in the usual sterile ophthalmic fashion and a lid speculum was placed. A paracentesis was created with the side port blade and the anterior chamber was filled with viscoelastic. A near clear corneal incision was performed with the steel keratome. A continuous curvilinear capsulorrhexis was performed with a cystotome followed by the capsulorrhexis forceps. Hydrodissection and hydrodelineation were carried out with BSS on a blunt cannula. The lens was removed in a stop and chop  technique and the remaining cortical material was removed with the irrigation-aspiration handpiece. The capsular bag was inflated with viscoelastic and the Technis [id number] lens was placed in the capsular bag without complication. The remaining viscoelastic was removed from the eye with the irrigation-aspiration handpiece. The wounds were hydrated. The anterior chamber was flushed with Miostat and the eye was inflated to physiologic pressure. 0.1 mL of cefuroxime concentration 10 mg/mL was placed in the anterior chamber. The wounds were found  to be water tight. The eye was dressed with Vigamox. The patient was given protective glasses to wear throughout the day and a shield with which to sleep tonight. The patient was also given drops with which to begin a drop regimen today and will follow-up with me in one day.  Implant Name Type Inv. Item Serial No. Manufacturer Lot No. LRB No. Used  LENS IOL DIOP 19.0 - [id number] Intraocular Lens LENS IOL DIOP 19.0 [id number]   Left 1   Procedure(s) with comments: CATARACT EXTRACTION PHACO AND INTRAOCULAR LENS PLACEMENT (IOC) (Left) - Lot# [id number] H [place]: 00"37.5 AP%:20.7 CDE: 7.76   Electronically signed: Aleah Ahlgrim LOUIS 12/19/2015 8:21 AM

## 2015-12-19 NOTE — Anesthesia Postprocedure Evaluation (Signed)
Anesthesia Post Note  Patient: Jessica Haas  Procedure(s) Performed: Procedure(s) (LRB): CATARACT EXTRACTION PHACO AND INTRAOCULAR LENS PLACEMENT (IOC) (Left)  Patient location during evaluation: Short Stay Anesthesia Type: MAC Level of consciousness: awake Pain management: pain level controlled Vital Signs Assessment: post-procedure vital signs reviewed and stable Respiratory status: spontaneous breathing Cardiovascular status: stable Anesthetic complications: no    Last Vitals:  Vitals:   12/19/15 0644 12/19/15 0823  BP: (!) 130/93 (!) 144/86  Pulse: 75 67  Resp: 16 16  Temp: (!) 35.8 C 36.7 C    Last Pain:  Vitals:   12/19/15 0644  TempSrc: Tympanic  PainSc: 3                  Kymorah Korf K

## 2015-12-20 ENCOUNTER — Encounter: Payer: Self-pay | Admitting: Ophthalmology

## 2015-12-28 ENCOUNTER — Encounter: Payer: Self-pay | Admitting: Internal Medicine

## 2015-12-28 ENCOUNTER — Ambulatory Visit (INDEPENDENT_AMBULATORY_CARE_PROVIDER_SITE_OTHER): Payer: Medicare Other | Admitting: Internal Medicine

## 2015-12-28 VITALS — BP 130/80 | HR 89 | Wt 144.8 lb

## 2015-12-28 DIAGNOSIS — Z8711 Personal history of peptic ulcer disease: Secondary | ICD-10-CM

## 2015-12-28 DIAGNOSIS — E785 Hyperlipidemia, unspecified: Secondary | ICD-10-CM

## 2015-12-28 DIAGNOSIS — Z8719 Personal history of other diseases of the digestive system: Secondary | ICD-10-CM

## 2015-12-28 DIAGNOSIS — G4700 Insomnia, unspecified: Secondary | ICD-10-CM

## 2015-12-28 DIAGNOSIS — M4806 Spinal stenosis, lumbar region: Secondary | ICD-10-CM | POA: Diagnosis not present

## 2015-12-28 DIAGNOSIS — Z23 Encounter for immunization: Secondary | ICD-10-CM | POA: Diagnosis not present

## 2015-12-28 DIAGNOSIS — M48062 Spinal stenosis, lumbar region with neurogenic claudication: Secondary | ICD-10-CM

## 2015-12-28 MED ORDER — SPIRONOLACTONE 25 MG PO TABS: 25.0000 mg | ORAL_TABLET | Freq: Every day | ORAL | 2 refills | Status: DC

## 2015-12-28 NOTE — Patient Instructions (Addendum)
  I agree with your  concern about continuing your PPI (Nexium) in light of the recently published studies suggesting an association with increased risk of dementia and kidney failure. (and low B12)     I advise  you to try using  (Pepcid) famotidine 20 mg  twice daily, or ranitidine (Zantac) 150 mg twice daily if needed  .    if your reflux symptoms are controlled,  You can Continue the daily h2 blocker. If not,   Resume  otc  prilosec or Nexium   For a few days until sympotms resolve,  and then resume  H2 blocker.  [email or web address] for compression stockings.  Best worn  from am to night,  not night to am   Send me a message about any vaccinations done by [facility]

## 2015-12-28 NOTE — Progress Notes (Signed)
Subjective:  Patient ID: Jessica Haas, female    DOB: 08/14/1950  Age: 65 y.o. MRN: [id number]  CC: The primary encounter diagnosis was Need for influenza vaccination. Diagnoses of Spinal stenosis, lumbar region, with neurogenic claudication, Insomnia, persistent, H/O gastric ulcer, and Hyperlipidemia, mild were also pertinent to this visit.  HPI Jessica Haas presents for follow up.  Last seen in Oct 2014 for refill on ambein and evaluation of cough.  Has been receiving care at [facility] in the interim  Last visit there Feb 2017  Menopause: Transdermal Estrogen therapy was resumed,  Depression : prozac was stopped. Alprazolam was refilled 0.25 mg #60 with 3 refills by [facility] on Sept 7  FH breast CA: mother, twice Mammogram May 2017 normal [facility]   Had triple fusion surgery 1 year ago left midfoot at [employer] .  Still limping by mid afternoon but has "recovered fully" per ortho  Has radiculopathy left leg,  And claudication symptoms ibrought on by prolonged standing Had MIR lumbar spine idenitified as source due to spinal stenosis and scoliosis .  Gabapentin did not help and was too sedating . Tried Lyrica , helped for 3 weeks,  Then the weight gain was intolerable.    Tried Cymbalta,  Too sedating  Had an ESI in May in lumbar spine which did not relieve the pain.  Has also had 2 right hip steroid injections Has been referred to a neurosurgeon .Dr Macario Carls  In October   Has swelling in  Left foot,  Wearing compression stockings only at night.  Had vascular surgery on both legs.  For management of swelling ,  Had saphenous vein ablations   B12 normal  But dropped steadily on Nexium.   Cataract surgery one week ago left eye .  Tried 3 cholesterol lowering medications .  10 yr risk 7.65% HDL 86     Outpatient Medications Prior to Visit  Medication Sig Dispense Refill  . acetaminophen (TYLENOL) 500 MG tablet Take 500 mg by mouth 2 (two) times daily. Patient states she is taking  everyday    . ALPRAZolam (XANAX) 0.25 MG tablet Take 1 tablet (0.25 mg total) by mouth at bedtime as needed for sleep. 30 tablet 5  . celecoxib (CELEBREX) 200 MG capsule Take 1 capsule (200 mg total) by mouth daily. 90 capsule 2  . Cholecalciferol (VITAMIN D) 2000 units CAPS Take 1 capsule by mouth daily.    Marland Kitchen conjugated estrogens (PREMARIN) vaginal cream Use 2 times a week.    Marland Kitchen FLUoxetine (PROZAC) 20 MG tablet Take one by mouth 3 times a week. (Patient taking differently: Take 20 mg by mouth every other day. ) 36 tablet 3  . zolpidem (AMBIEN CR) 12.5 MG CR tablet TAKE 1 TABLET BY MOUTH AT BEDTIME AS NEEDED 30 tablet 0  . benzonatate (TESSALON) 200 MG capsule Take 1 capsule (200 mg total) by mouth 3 (three) times daily as needed for cough. (Patient not taking: Reported on 12/05/2015) 60 capsule 1  . phenazopyridine (PYRIDIUM) 200 MG tablet TAKE ONE TABLET BY MOUTH 3 TIMES DAILY AS NEEDED FOR PAIN (Patient not taking: Reported on 12/05/2015) 10 tablet 0  . predniSONE (STERAPRED UNI-PAK) 10 MG tablet 6 tablets on Day 1 , then reduce by 1 tablet daily until gone (take all tablets at once) (Patient not taking: Reported on 12/05/2015) 21 tablet 0   No facility-administered medications prior to visit.     Review of Systems;  Patient denies headache,  fevers, malaise, unintentional weight loss, skin rash, eye pain, sinus congestion and sinus pain, sore throat, dysphagia,  hemoptysis , cough, dyspnea, wheezing, chest pain, palpitations, orthopnea, edema, abdominal pain, nausea, melena, diarrhea, constipation, flank pain, dysuria, hematuria, urinary  Frequency, nocturia, numbness, tingling, seizures,  Focal weakness, Loss of consciousness,  Tremor, insomnia, depression, anxiety, and suicidal ideation.      Objective:  BP 130/80   Pulse 89   Wt 144 lb 12.8 oz (65.7 kg)   SpO2 97%   BMI 23.37 kg/m   BP Readings from Last 3 Encounters:  12/28/15 130/80  12/19/15 (!) 144/86  02/15/13 118/70    Wt  Readings from Last 3 Encounters:  12/28/15 144 lb 12.8 oz (65.7 kg)  12/19/15 140 lb (63.5 kg)  01/06/13 145 lb 8 oz (66 kg)    General appearance: alert, cooperative and appears stated age Ears: normal TM's and external ear canals both ears Throat: lips, mucosa, and tongue normal; teeth and gums normal Neck: no adenopathy, no carotid bruit, supple, symmetrical, trachea midline and thyroid not enlarged, symmetric, no tenderness/mass/nodules Back: symmetric, no curvature. ROM normal. No CVA tenderness. Lungs: clear to auscultation bilaterally Heart: regular rate and rhythm, S1, S2 normal, no murmur, click, rub or gallop Abdomen: soft, non-tender; bowel sounds normal; no masses,  no organomegaly Pulses: 2+ and symmetric Skin: Skin color, texture, turgor normal. No rashes or lesions Lymph nodes: Cervical, supraclavicular, and axillary nodes normal.  No results found for: HGBA1C  Lab Results  Component Value Date   CREATININE 0.8 04/27/2012   CREATININE 0.8 03/28/2011    Lab Results  Component Value Date   WBC 3.2 (L) 05/21/2012   HGB 12.7 05/21/2012   HCT 37.8 05/21/2012   PLT 216.0 05/21/2012   GLUCOSE 80 04/27/2012   CHOL 237 (H) 04/27/2012   TRIG 80.0 04/27/2012   HDL 86.10 04/27/2012   LDLDIRECT 135.2 04/27/2012   LDLCALC 110 03/28/2011   ALT 14 04/27/2012   AST 24 04/27/2012   NA 139 04/27/2012   K 4.0 04/27/2012   CL 105 04/27/2012   CREATININE 0.8 04/27/2012   BUN 19 04/27/2012   CO2 28 04/27/2012   TSH 0.80 04/27/2012    No results found.  Assessment & Plan:   Problem List Items Addressed This Visit    Insomnia, persistent    Managed with ambien vs alprazolam The risks and benefits of benzodiazepine use were discussed with patient today including excessive sedation leading to respiratory depression,  impaired thinking/driving, and addiction.  Patient was advised to avoid concurrent use with alcohol, to use medication only as needed and not to share with  others  .       H/O gastric ulcer    Discussed current controversy regarding prolonged use of PPI in patients without documented Barretts esophagus.  Patient has no prior EGD but has been on PPI therapy for > 5 years (per patient).  Suggested trial of pepcid 20 mg daily.  If GERD symptoms return,  advised her to accept referral for EGD.       Spinal stenosis, lumbar region, with neurogenic claudication    She has failed conservative management including ESI and medications .  Considering surgery .  Will consider acupuncture first.  May repeat trial of Lyrica and add diuretic for the swelling.        Hyperlipidemia, mild    Based on  current lipid profile, the risk of clinically significant CAD is <7% over the next 10 years,  using the Framingham risk calculator,  Will repeat the fasting  labs in 1 year.         Relevant Medications   spironolactone (ALDACTONE) 25 MG tablet    Other Visit Diagnoses    Need for influenza vaccination    -  Primary   Relevant Orders   Flu vaccine HIGH DOSE PF (Fluzone High dose) (Completed)      I have discontinued Ms. Erkkila's predniSONE, benzonatate, and phenazopyridine. I am also having her start on spironolactone. Additionally, I am having her maintain her conjugated estrogens, FLUoxetine, celecoxib, ALPRAZolam, zolpidem, Vitamin D, acetaminophen, estradiol, Estradiol, and estradiol.  Meds ordered this encounter  Medications  . estradiol (VIVELLE-DOT) 0.025 MG/24HR    Sig: Place 0.025 patches onto the skin once a week.  . Estradiol (VAGIFEM) 10 MCG TABS vaginal tablet    Sig: Place 10 mcg vaginally once a week.  . estradiol (ESTRACE) 0.1 MG/GM vaginal cream    Sig: Place 0.01 g vaginally as needed.  Marland Kitchen spironolactone (ALDACTONE) 25 MG tablet    Sig: Take 1 tablet (25 mg total) by mouth daily. As needed for fluid retention    Dispense:  30 tablet    Refill:  2   A total of 45 minutes was spent with patient more than half of which was spent in  counseling patient on the above mentioned issues , reviewing and explaining recent labs and imaging studies done, and coordination of care.  Medications Discontinued During This Encounter  Medication Reason  . benzonatate (TESSALON) 200 MG capsule Completed Course  . phenazopyridine (PYRIDIUM) 200 MG tablet Completed Course  . predniSONE (STERAPRED UNI-PAK) 10 MG tablet Completed Course    Follow-up: No Follow-up on file.   Crecencio Mc, MD

## 2015-12-30 ENCOUNTER — Encounter: Payer: Self-pay | Admitting: Internal Medicine

## 2015-12-30 DIAGNOSIS — E785 Hyperlipidemia, unspecified: Secondary | ICD-10-CM | POA: Insufficient documentation

## 2015-12-30 DIAGNOSIS — M48062 Spinal stenosis, lumbar region with neurogenic claudication: Secondary | ICD-10-CM | POA: Insufficient documentation

## 2015-12-30 NOTE — Assessment & Plan Note (Signed)
Based on  current lipid profile, the risk of clinically significant CAD is <7% over the next 10 years, using the Framingham risk calculator,  Will repeat the fasting  labs in 1 year.

## 2015-12-30 NOTE — Assessment & Plan Note (Signed)
Managed with ambien vs alprazolam The risks and benefits of benzodiazepine use were discussed with patient today including excessive sedation leading to respiratory depression,  impaired thinking/driving, and addiction.  Patient was advised to avoid concurrent use with alcohol, to use medication only as needed and not to share with others  .

## 2015-12-30 NOTE — Assessment & Plan Note (Signed)
Discussed current controversy regarding prolonged use of PPI in patients without documented Barretts esophagus.  Patient has no prior EGD but has been on PPI therapy for > 5 years (per patient).  Suggested trial of pepcid 20 mg daily.  If GERD symptoms return,  advised her to accept referral for EGD.  

## 2015-12-30 NOTE — Assessment & Plan Note (Signed)
She has failed conservative management including ESI and medications .  Considering surgery .  Will consider acupuncture first.  May repeat trial of Lyrica and add diuretic for the swelling.

## 2016-01-11 ENCOUNTER — Other Ambulatory Visit: Payer: Self-pay | Admitting: Internal Medicine

## 2016-01-11 ENCOUNTER — Encounter: Payer: Self-pay | Admitting: Internal Medicine

## 2016-01-11 MED ORDER — FUROSEMIDE 20 MG PO TABS: 20.0000 mg | ORAL_TABLET | Freq: Every day | ORAL | 0 refills | Status: DC

## 2016-02-01 ENCOUNTER — Other Ambulatory Visit: Payer: Self-pay | Admitting: Internal Medicine

## 2016-02-01 ENCOUNTER — Telehealth: Payer: Self-pay | Admitting: *Deleted

## 2016-02-01 NOTE — Telephone Encounter (Signed)
[facility] two different Rx for pt for fluid. She requested clarity on which Rx  Should be filled  [place] [phone]

## 2016-02-01 NOTE — Telephone Encounter (Signed)
Spoke with Elmyra Ricks at the pharmacy, clarified the order. thanks

## 2016-02-05 DIAGNOSIS — H2511 Age-related nuclear cataract, right eye: Secondary | ICD-10-CM | POA: Diagnosis not present

## 2016-02-07 ENCOUNTER — Encounter: Payer: Self-pay | Admitting: *Deleted

## 2016-02-13 ENCOUNTER — Ambulatory Visit: Payer: Medicare Other | Admitting: Certified Registered Nurse Anesthetist

## 2016-02-13 ENCOUNTER — Ambulatory Visit
Admission: RE | Admit: 2016-02-13 | Discharge: 2016-02-13 | Disposition: A | Discharge status: Home / Self Care | Payer: Medicare Other | Source: Ambulatory Visit | Attending: Ophthalmology | Admitting: Ophthalmology

## 2016-02-13 ENCOUNTER — Encounter
Admission: RE | Disposition: A | Discharge status: Home / Self Care | Payer: Self-pay | Source: Ambulatory Visit | Attending: Ophthalmology

## 2016-02-13 ENCOUNTER — Encounter: Payer: Self-pay | Admitting: *Deleted

## 2016-02-13 DIAGNOSIS — M1990 Unspecified osteoarthritis, unspecified site: Secondary | ICD-10-CM | POA: Diagnosis not present

## 2016-02-13 DIAGNOSIS — Z85828 Personal history of other malignant neoplasm of skin: Secondary | ICD-10-CM | POA: Insufficient documentation

## 2016-02-13 DIAGNOSIS — Z79899 Other long term (current) drug therapy: Secondary | ICD-10-CM | POA: Diagnosis not present

## 2016-02-13 DIAGNOSIS — H2511 Age-related nuclear cataract, right eye: Secondary | ICD-10-CM | POA: Insufficient documentation

## 2016-02-13 DIAGNOSIS — K219 Gastro-esophageal reflux disease without esophagitis: Secondary | ICD-10-CM | POA: Insufficient documentation

## 2016-02-13 DIAGNOSIS — Z791 Long term (current) use of non-steroidal anti-inflammatories (NSAID): Secondary | ICD-10-CM | POA: Diagnosis not present

## 2016-02-13 DIAGNOSIS — K449 Diaphragmatic hernia without obstruction or gangrene: Secondary | ICD-10-CM | POA: Insufficient documentation

## 2016-02-13 DIAGNOSIS — Z9849 Cataract extraction status, unspecified eye: Secondary | ICD-10-CM | POA: Diagnosis not present

## 2016-02-13 DIAGNOSIS — Z79818 Long term (current) use of other agents affecting estrogen receptors and estrogen levels: Secondary | ICD-10-CM | POA: Insufficient documentation

## 2016-02-13 DIAGNOSIS — Z881 Allergy status to other antibiotic agents status: Secondary | ICD-10-CM | POA: Insufficient documentation

## 2016-02-13 DIAGNOSIS — Z90710 Acquired absence of both cervix and uterus: Secondary | ICD-10-CM | POA: Insufficient documentation

## 2016-02-13 DIAGNOSIS — Z888 Allergy status to other drugs, medicaments and biological substances status: Secondary | ICD-10-CM | POA: Diagnosis not present

## 2016-02-13 DIAGNOSIS — F419 Anxiety disorder, unspecified: Secondary | ICD-10-CM | POA: Diagnosis not present

## 2016-02-13 DIAGNOSIS — Z8711 Personal history of peptic ulcer disease: Secondary | ICD-10-CM | POA: Diagnosis not present

## 2016-02-13 DIAGNOSIS — Z9889 Other specified postprocedural states: Secondary | ICD-10-CM | POA: Insufficient documentation

## 2016-02-13 HISTORY — DX: Anxiety disorder, unspecified: F41.9

## 2016-02-13 HISTORY — PX: CATARACT EXTRACTION W/PHACO: SHX586

## 2016-02-13 SURGERY — PHACOEMULSIFICATION, CATARACT, WITH IOL INSERTION
Anesthesia: Monitor Anesthesia Care | Site: Eye | Laterality: Right | Wound class: Clean

## 2016-02-13 MED ORDER — ARMC OPHTHALMIC DILATING DROPS
OPHTHALMIC | Status: AC
  Filled 2016-02-13: qty 0.4

## 2016-02-13 MED ORDER — EPINEPHRINE PF 1 MG/ML IJ SOLN
INTRAMUSCULAR | Status: AC
  Filled 2016-02-13: qty 2

## 2016-02-13 MED ORDER — NA CHONDROIT SULF-NA HYALURON 40-17 MG/ML IO SOLN
INTRAOCULAR | Status: DC | PRN
  Administered 2016-02-13: 1 mL via INTRAOCULAR

## 2016-02-13 MED ORDER — ARMC OPHTHALMIC DILATING DROPS
1.0000 "application " | OPHTHALMIC | Status: AC
  Administered 2016-02-13 (×4): 1 via OPHTHALMIC

## 2016-02-13 MED ORDER — FENTANYL CITRATE (PF) 100 MCG/2ML IJ SOLN: 25.0000 ug | INTRAMUSCULAR | Status: DC | PRN

## 2016-02-13 MED ORDER — MOXIFLOXACIN HCL 0.5 % OP SOLN
OPHTHALMIC | Status: AC
  Administered 2016-02-13: 08:00:00
  Filled 2016-02-13: qty 3

## 2016-02-13 MED ORDER — POVIDONE-IODINE 5 % OP SOLN
OPHTHALMIC | Status: AC
  Filled 2016-02-13: qty 30

## 2016-02-13 MED ORDER — NA CHONDROIT SULF-NA HYALURON 40-17 MG/ML IO SOLN
INTRAOCULAR | Status: AC
  Filled 2016-02-13: qty 1

## 2016-02-13 MED ORDER — MOXIFLOXACIN HCL 0.5 % OP SOLN
OPHTHALMIC | Status: DC | PRN
  Administered 2016-02-13: 9 [drp] via OPHTHALMIC

## 2016-02-13 MED ORDER — MIDAZOLAM HCL 2 MG/2ML IJ SOLN
INTRAMUSCULAR | Status: DC | PRN
  Administered 2016-02-13: 2 mg via INTRAVENOUS

## 2016-02-13 MED ORDER — LIDOCAINE HCL (PF) 4 % IJ SOLN
INTRAOCULAR | Status: DC | PRN
  Administered 2016-02-13: 4 mL via OPHTHALMIC

## 2016-02-13 MED ORDER — LIDOCAINE HCL (PF) 4 % IJ SOLN
INTRAMUSCULAR | Status: AC
  Filled 2016-02-13: qty 5

## 2016-02-13 MED ORDER — CARBACHOL 0.01 % IO SOLN
INTRAOCULAR | Status: DC | PRN
  Administered 2016-02-13: 0.5 mL via INTRAOCULAR

## 2016-02-13 MED ORDER — ONDANSETRON HCL 4 MG/2ML IJ SOLN
INTRAMUSCULAR | Status: DC | PRN
  Administered 2016-02-13: 4 mg via INTRAVENOUS

## 2016-02-13 MED ORDER — ONDANSETRON HCL 4 MG/2ML IJ SOLN: 4.0000 mg | Freq: Once | INTRAMUSCULAR | Status: DC | PRN

## 2016-02-13 MED ORDER — MOXIFLOXACIN HCL 0.5 % OP SOLN
1.0000 [drp] | OPHTHALMIC | Status: AC
  Administered 2016-02-13: 1 [drp] via OPHTHALMIC

## 2016-02-13 MED ORDER — SODIUM CHLORIDE 0.9 % IV SOLN
INTRAVENOUS | Status: DC
  Administered 2016-02-13: 08:00:00 via INTRAVENOUS

## 2016-02-13 SURGICAL SUPPLY — 21 items
CANNULA ANT/CHMB 27GA (MISCELLANEOUS) ×2 IMPLANT
CUP MEDICINE 2OZ PLAST GRAD ST (MISCELLANEOUS) ×2 IMPLANT
GLOVE BIO SURGEON STRL SZ8 (GLOVE) ×2 IMPLANT
GLOVE BIOGEL M 6.5 STRL (GLOVE) ×2 IMPLANT
GLOVE SURG LX 8.0 MICRO (GLOVE) ×1
GLOVE SURG LX STRL 8.0 MICRO (GLOVE) ×1 IMPLANT
GOWN STRL REUS W/ TWL LRG LVL3 (GOWN DISPOSABLE) ×2 IMPLANT
GOWN STRL REUS W/TWL LRG LVL3 (GOWN DISPOSABLE) ×2
LENS IOL TECNIS ITEC 19.0 (Intraocular Lens) ×2 IMPLANT
PACK CATARACT (MISCELLANEOUS) ×2 IMPLANT
PACK CATARACT BRASINGTON LX (MISCELLANEOUS) ×2 IMPLANT
PACK EYE AFTER SURG (MISCELLANEOUS) ×2 IMPLANT
SOL BSS BAG (MISCELLANEOUS) ×2
SOL PREP PVP 2OZ (MISCELLANEOUS) ×2
SOLUTION BSS BAG (MISCELLANEOUS) ×1 IMPLANT
SOLUTION PREP PVP 2OZ (MISCELLANEOUS) ×1 IMPLANT
SYR 3ML LL SCALE MARK (SYRINGE) ×2 IMPLANT
SYR 5ML LL (SYRINGE) ×2 IMPLANT
SYR TB 1ML 27GX1/2 LL (SYRINGE) ×2 IMPLANT
WATER STERILE IRR 250ML POUR (IV SOLUTION) ×2 IMPLANT
WIPE NON LINTING 3.25X3.25 (MISCELLANEOUS) ×2 IMPLANT

## 2016-02-13 NOTE — H&P (Signed)
All labs reviewed. Abnormal studies sent to patients PCP when indicated.  Previous H&P reviewed, patient examined, there are NO CHANGES.  Jessica Haas LOUIS10/24/20178:23 AM

## 2016-02-13 NOTE — Transfer of Care (Signed)
Immediate Anesthesia Transfer of Care Note  Patient: Jessica Haas  Procedure(s) Performed: Procedure(s) with comments: CATARACT EXTRACTION PHACO AND INTRAOCULAR LENS PLACEMENT (IOC) (Right) - Lot [id number] H [place]: 32.5 AP%: 40.8 CDE: 7.05  Patient Location: PHASE II  Anesthesia Type:MAC  Level of Consciousness: Awake, Alert, Oriented  Airway & Oxygen Therapy: Patient Spontanous Breathing and Patient on room air   Post-op Assessment: Report given to RN and Post -op Vital signs reviewed and stable  Post vital signs: Reviewed and stable  Last Vitals:  Vitals:   02/13/16 0753  BP: 127/83  Pulse: 85  Resp: 16  Temp: 123XX123 C    Complications: No apparent anesthesia complications

## 2016-02-13 NOTE — Op Note (Signed)
PREOPERATIVE DIAGNOSIS:  Nuclear sclerotic cataract of the right eye.   POSTOPERATIVE DIAGNOSIS:  Right nuclear sclerotic CATARACT   OPERATIVE PROCEDURE: Procedure(s): CATARACT EXTRACTION PHACO AND INTRAOCULAR LENS PLACEMENT (IOC)   SURGEON:  Birder Robson, MD.   ANESTHESIA:  Anesthesiologist: Alvin Critchley, MD CRNA: Demetrius Charity, CRNA  1.      Managed anesthesia care. 2.      0.54ml of Shugarcaine was instilled in the eye following the paracentesis.   COMPLICATIONS:  None.   TECHNIQUE:   Stop and chop   DESCRIPTION OF PROCEDURE:  The patient was examined and consented in the preoperative holding area where the aforementioned topical anesthesia was applied to the right eye and then brought back to the Operating Room where the right eye was prepped and draped in the usual sterile ophthalmic fashion and a lid speculum was placed. A paracentesis was created with the side port blade and the anterior chamber was filled with viscoelastic. A near clear corneal incision was performed with the steel keratome. A continuous curvilinear capsulorrhexis was performed with a cystotome followed by the capsulorrhexis forceps. Hydrodissection and hydrodelineation were carried out with BSS on a blunt cannula. The lens was removed in a stop and chop  technique and the remaining cortical material was removed with the irrigation-aspiration handpiece. The capsular bag was inflated with viscoelastic and the Technis [id number]  lens was placed in the capsular bag without complication. The remaining viscoelastic was removed from the eye with the irrigation-aspiration handpiece. The wounds were hydrated. The anterior chamber was flushed with Miostat and the eye was inflated to physiologic pressure. 0.25ml of Vigamox was placed in the anterior chamber. The wounds were found to be water tight. The eye was dressed with Vigamox. The patient was given protective glasses to wear throughout the day and a shield with which to sleep  tonight. The patient was also given drops with which to begin a drop regimen today and will follow-up with me in one day.  Implant Name Type Inv. Item Serial No. Manufacturer Lot No. LRB No. Used  LENS IOL DIOP 19.0 - [id number] Intraocular Lens LENS IOL DIOP 19.0 [id number]   Right 1   Procedure(s) with comments: CATARACT EXTRACTION PHACO AND INTRAOCULAR LENS PLACEMENT (IOC) (Right) - Lot [id number] H [place]: 32.5 AP%: 40.8 CDE: 7.05  Electronically signed: Wilkinson 02/13/2016 9:16 AM

## 2016-02-13 NOTE — Anesthesia Procedure Notes (Signed)
Procedure Name: MAC Performed by: Lacara Dunsworth Pre-anesthesia Checklist: Emergency Drugs available, Patient identified, Suction available, Patient being monitored and Timeout performed Oxygen Delivery Method: Nasal cannula       

## 2016-02-13 NOTE — Telephone Encounter (Signed)
error 

## 2016-02-13 NOTE — Anesthesia Postprocedure Evaluation (Signed)
Anesthesia Post Note  Patient: Jessica Haas  Procedure(s) Performed: Procedure(s) (LRB): CATARACT EXTRACTION PHACO AND INTRAOCULAR LENS PLACEMENT (IOC) (Right)  Patient location during evaluation: PACU Anesthesia Type: MAC Level of consciousness: awake and alert Pain management: pain level controlled Vital Signs Assessment: post-procedure vital signs reviewed and stable Respiratory status: spontaneous breathing Cardiovascular status: blood pressure returned to baseline Postop Assessment: no headache, no backache and no signs of nausea or vomiting Anesthetic complications: no    Last Vitals:  Vitals:   02/13/16 0753  BP: 127/83  Pulse: 85  Resp: 16  Temp: 36.6 C    Last Pain:  Vitals:   02/13/16 0753  TempSrc: Oral                 Alison Stalling

## 2016-02-13 NOTE — Anesthesia Preprocedure Evaluation (Signed)
Anesthesia Evaluation  Patient identified by MRN, date of birth, ID band Patient awake    Reviewed: Allergy & Precautions, NPO status , Patient's Chart, lab work & pertinent test results  History of Anesthesia Complications (+) PONV  Airway Mallampati: I       Dental no notable dental hx. (+) Caps   Pulmonary neg pulmonary ROS, former smoker,    Pulmonary exam normal        Cardiovascular negative cardio ROS Normal cardiovascular exam     Neuro/Psych PSYCHIATRIC DISORDERS Anxiety Depression Neurogenic claudication  Neuromuscular disease    GI/Hepatic Neg liver ROS, hiatal hernia, PUD, GERD  Medicated,  Endo/Other    Renal/GU negative Renal ROS  negative genitourinary   Musculoskeletal  (+) Arthritis , Osteoarthritis,  Spinal stenosis   Abdominal Normal abdominal exam  (+)   Peds negative pediatric ROS (+)  Hematology  (+) anemia ,   Anesthesia Other Findings Past Medical History: 2008: Anemia     Comment: required transfusion No date: Anxiety No date: Arthritis No date: Basal cell carcinoma of hand No date: Depression No date: GERD (gastroesophageal reflux disease) 2008: H/O abnormal mammogram     Comment: receiving annual MRIs, [facility]  2008: H/O gastric ulcer 2008: High altitude sickness No date: History of hiatal hernia No date: Swelling     Comment: FOOT  Reproductive/Obstetrics                             Anesthesia Physical  Anesthesia Plan  ASA: II  Anesthesia Plan: MAC   Post-op Pain Management:    Induction: Intravenous  Airway Management Planned:   Additional Equipment:   Intra-op Plan:   Post-operative Plan:   Informed Consent: I have reviewed the patients History and Physical, chart, labs and discussed the procedure including the risks, benefits and alternatives for the proposed anesthesia with the patient or authorized representative who has indicated  his/her understanding and acceptance.     Plan Discussed with: CRNA and Surgeon  Anesthesia Plan Comments: (Zofran peri-op)        Anesthesia Quick Evaluation

## 2016-02-13 NOTE — Discharge Instructions (Signed)
Eye Surgery Discharge Instructions  Expect mild scratchy sensation or mild soreness. DO NOT RUB YOUR EYE!  The day of surgery:  Minimal physical activity, but bed rest is not required  No reading, computer work, or close hand work  No bending, lifting, or straining.  May watch TV  For 24 hours:  No driving, legal decisions, or alcoholic beverages  Safety precautions  Eat anything you prefer: It is better to start with liquids, then soup then solid foods.  _____ Eye patch should be worn until postoperative exam tomorrow.  ____ Solar shield eyeglasses should be worn for comfort in the sunlight/patch while sleeping  Resume all regular medications including aspirin or Coumadin if these were discontinued prior to surgery. You may shower, bathe, shave, or wash your hair. Tylenol may be taken for mild discomfort.  Call your doctor if you experience significant pain, nausea, or vomiting, fever > 101 or other signs of infection. [phone] or [phone] Specific instructions:  Follow-up Information    PORFILIO,WILLIAM LOUIS, MD Follow up on 02/14/2016.   Specialty:  Ophthalmology Why:  10:55 Contact information: [address] [place] [address] [phone]

## 2016-02-14 ENCOUNTER — Encounter: Payer: Self-pay | Admitting: Ophthalmology

## 2016-03-03 ENCOUNTER — Encounter: Payer: Self-pay | Admitting: Internal Medicine

## 2016-03-05 DIAGNOSIS — M5412 Radiculopathy, cervical region: Secondary | ICD-10-CM | POA: Diagnosis not present

## 2016-03-05 DIAGNOSIS — M5416 Radiculopathy, lumbar region: Secondary | ICD-10-CM | POA: Diagnosis not present

## 2016-03-08 ENCOUNTER — Other Ambulatory Visit: Payer: Self-pay | Admitting: Orthopedic Surgery

## 2016-03-08 DIAGNOSIS — M5412 Radiculopathy, cervical region: Secondary | ICD-10-CM

## 2016-03-19 DIAGNOSIS — L853 Xerosis cutis: Secondary | ICD-10-CM | POA: Diagnosis not present

## 2016-03-19 DIAGNOSIS — D692 Other nonthrombocytopenic purpura: Secondary | ICD-10-CM | POA: Diagnosis not present

## 2016-03-19 DIAGNOSIS — L820 Inflamed seborrheic keratosis: Secondary | ICD-10-CM | POA: Diagnosis not present

## 2016-03-19 DIAGNOSIS — D485 Neoplasm of uncertain behavior of skin: Secondary | ICD-10-CM | POA: Diagnosis not present

## 2016-03-19 DIAGNOSIS — C4492 Squamous cell carcinoma of skin, unspecified: Secondary | ICD-10-CM

## 2016-03-19 DIAGNOSIS — Z85828 Personal history of other malignant neoplasm of skin: Secondary | ICD-10-CM | POA: Diagnosis not present

## 2016-03-19 DIAGNOSIS — C4490 Unspecified malignant neoplasm of skin, unspecified: Secondary | ICD-10-CM

## 2016-03-19 DIAGNOSIS — L814 Other melanin hyperpigmentation: Secondary | ICD-10-CM | POA: Diagnosis not present

## 2016-03-19 DIAGNOSIS — L821 Other seborrheic keratosis: Secondary | ICD-10-CM | POA: Diagnosis not present

## 2016-03-19 DIAGNOSIS — Z1283 Encounter for screening for malignant neoplasm of skin: Secondary | ICD-10-CM | POA: Diagnosis not present

## 2016-03-19 HISTORY — DX: Unspecified malignant neoplasm of skin, unspecified: C44.90

## 2016-03-19 HISTORY — DX: Squamous cell carcinoma of skin, unspecified: C44.92

## 2016-03-21 ENCOUNTER — Ambulatory Visit
Admission: RE | Admit: 2016-03-21 | Discharge: 2016-03-21 | Disposition: A | Discharge status: Home / Self Care | Payer: Medicare Other | Source: Ambulatory Visit | Attending: Orthopedic Surgery | Admitting: Orthopedic Surgery

## 2016-03-21 DIAGNOSIS — M2578 Osteophyte, vertebrae: Secondary | ICD-10-CM | POA: Insufficient documentation

## 2016-03-21 DIAGNOSIS — M5412 Radiculopathy, cervical region: Secondary | ICD-10-CM | POA: Diagnosis not present

## 2016-03-21 DIAGNOSIS — M50223 Other cervical disc displacement at C6-C7 level: Secondary | ICD-10-CM | POA: Insufficient documentation

## 2016-03-21 DIAGNOSIS — M4682 Other specified inflammatory spondylopathies, cervical region: Secondary | ICD-10-CM | POA: Insufficient documentation

## 2016-03-21 DIAGNOSIS — M4802 Spinal stenosis, cervical region: Secondary | ICD-10-CM | POA: Diagnosis not present

## 2016-03-21 DIAGNOSIS — Z981 Arthrodesis status: Secondary | ICD-10-CM | POA: Insufficient documentation

## 2016-04-01 DIAGNOSIS — M79672 Pain in left foot: Secondary | ICD-10-CM | POA: Diagnosis not present

## 2016-04-01 DIAGNOSIS — M216X2 Other acquired deformities of left foot: Secondary | ICD-10-CM | POA: Diagnosis not present

## 2016-04-01 DIAGNOSIS — D2372 Other benign neoplasm of skin of left lower limb, including hip: Secondary | ICD-10-CM | POA: Diagnosis not present

## 2016-04-18 DIAGNOSIS — M5416 Radiculopathy, lumbar region: Secondary | ICD-10-CM | POA: Diagnosis not present

## 2016-04-26 ENCOUNTER — Ambulatory Visit: Payer: Medicare Other | Admitting: Family Medicine

## 2016-05-02 ENCOUNTER — Other Ambulatory Visit: Payer: Self-pay | Admitting: Internal Medicine

## 2016-05-02 NOTE — Telephone Encounter (Signed)
Left message to return call 

## 2016-05-02 NOTE — Telephone Encounter (Signed)
Patient is no longer taking

## 2016-05-09 ENCOUNTER — Ambulatory Visit: Payer: Medicare Other

## 2016-05-14 ENCOUNTER — Ambulatory Visit: Payer: Medicare Other

## 2016-05-15 ENCOUNTER — Other Ambulatory Visit: Payer: Self-pay

## 2016-05-15 ENCOUNTER — Other Ambulatory Visit: Payer: Self-pay | Admitting: Radiology

## 2016-05-15 MED ORDER — ZOSTER VAC RECOMB ADJUVANTED 50 MCG/0.5ML IM SUSR: 50.0000 ug | Freq: Once | INTRAMUSCULAR | 0 refills | Status: AC

## 2016-05-15 NOTE — Telephone Encounter (Signed)
[facility] requesting per patient request.  Order Pend  Last Zoster Vac 02/15/2010  please advise

## 2016-05-15 NOTE — Telephone Encounter (Signed)
AUTHORIZED

## 2016-05-23 NOTE — Telephone Encounter (Signed)
LMOM to see if patient receive her vaccine

## 2016-05-28 DIAGNOSIS — Q6689 Other  specified congenital deformities of feet: Secondary | ICD-10-CM | POA: Diagnosis not present

## 2016-06-03 DIAGNOSIS — L570 Actinic keratosis: Secondary | ICD-10-CM | POA: Diagnosis not present

## 2016-06-03 DIAGNOSIS — L820 Inflamed seborrheic keratosis: Secondary | ICD-10-CM | POA: Diagnosis not present

## 2016-06-03 DIAGNOSIS — I788 Other diseases of capillaries: Secondary | ICD-10-CM | POA: Diagnosis not present

## 2016-06-03 DIAGNOSIS — L578 Other skin changes due to chronic exposure to nonionizing radiation: Secondary | ICD-10-CM | POA: Diagnosis not present

## 2016-06-03 DIAGNOSIS — D0471 Carcinoma in situ of skin of right lower limb, including hip: Secondary | ICD-10-CM | POA: Diagnosis not present

## 2016-06-15 ENCOUNTER — Other Ambulatory Visit: Payer: Self-pay | Admitting: Internal Medicine

## 2016-06-18 NOTE — Telephone Encounter (Signed)
LOV: 12/28/15 Next OV: 07/04/16  Furosemide was discontinued on 02/07/16 due to allergic reaction.  Ok to Rf?

## 2016-07-04 ENCOUNTER — Ambulatory Visit: Payer: Medicare Other | Admitting: Internal Medicine

## 2016-08-19 DIAGNOSIS — L820 Inflamed seborrheic keratosis: Secondary | ICD-10-CM | POA: Diagnosis not present

## 2016-08-22 DIAGNOSIS — L718 Other rosacea: Secondary | ICD-10-CM | POA: Diagnosis not present

## 2016-08-22 DIAGNOSIS — L821 Other seborrheic keratosis: Secondary | ICD-10-CM | POA: Diagnosis not present

## 2016-08-22 DIAGNOSIS — L578 Other skin changes due to chronic exposure to nonionizing radiation: Secondary | ICD-10-CM | POA: Diagnosis not present

## 2016-08-22 DIAGNOSIS — L820 Inflamed seborrheic keratosis: Secondary | ICD-10-CM | POA: Diagnosis not present

## 2016-08-22 DIAGNOSIS — L812 Freckles: Secondary | ICD-10-CM | POA: Diagnosis not present

## 2016-08-22 DIAGNOSIS — L720 Epidermal cyst: Secondary | ICD-10-CM | POA: Diagnosis not present

## 2016-09-09 DIAGNOSIS — L570 Actinic keratosis: Secondary | ICD-10-CM | POA: Diagnosis not present

## 2016-09-23 DIAGNOSIS — M19072 Primary osteoarthritis, left ankle and foot: Secondary | ICD-10-CM | POA: Diagnosis not present

## 2016-10-31 DIAGNOSIS — E559 Vitamin D deficiency, unspecified: Secondary | ICD-10-CM | POA: Diagnosis not present

## 2016-10-31 DIAGNOSIS — I7300 Raynaud's syndrome without gangrene: Secondary | ICD-10-CM | POA: Diagnosis not present

## 2016-10-31 DIAGNOSIS — Z9189 Other specified personal risk factors, not elsewhere classified: Secondary | ICD-10-CM | POA: Diagnosis not present

## 2016-10-31 DIAGNOSIS — E538 Deficiency of other specified B group vitamins: Secondary | ICD-10-CM | POA: Diagnosis not present

## 2016-10-31 DIAGNOSIS — Z23 Encounter for immunization: Secondary | ICD-10-CM | POA: Diagnosis not present

## 2016-10-31 DIAGNOSIS — E785 Hyperlipidemia, unspecified: Secondary | ICD-10-CM | POA: Diagnosis not present

## 2016-10-31 DIAGNOSIS — M5412 Radiculopathy, cervical region: Secondary | ICD-10-CM | POA: Diagnosis not present

## 2016-10-31 DIAGNOSIS — Z1231 Encounter for screening mammogram for malignant neoplasm of breast: Secondary | ICD-10-CM | POA: Diagnosis not present

## 2016-11-04 DIAGNOSIS — H26491 Other secondary cataract, right eye: Secondary | ICD-10-CM | POA: Diagnosis not present

## 2016-11-13 DIAGNOSIS — K5900 Constipation, unspecified: Secondary | ICD-10-CM | POA: Diagnosis not present

## 2016-11-13 DIAGNOSIS — Z8371 Family history of colonic polyps: Secondary | ICD-10-CM | POA: Diagnosis not present

## 2016-12-20 DIAGNOSIS — M79601 Pain in right arm: Secondary | ICD-10-CM | POA: Diagnosis not present

## 2016-12-20 DIAGNOSIS — R202 Paresthesia of skin: Secondary | ICD-10-CM | POA: Diagnosis not present

## 2016-12-20 DIAGNOSIS — M542 Cervicalgia: Secondary | ICD-10-CM | POA: Diagnosis not present

## 2016-12-20 DIAGNOSIS — R200 Anesthesia of skin: Secondary | ICD-10-CM | POA: Diagnosis not present

## 2016-12-24 DIAGNOSIS — Z8371 Family history of colonic polyps: Secondary | ICD-10-CM | POA: Diagnosis not present

## 2016-12-24 DIAGNOSIS — K648 Other hemorrhoids: Secondary | ICD-10-CM | POA: Diagnosis not present

## 2016-12-24 DIAGNOSIS — K640 First degree hemorrhoids: Secondary | ICD-10-CM | POA: Diagnosis not present

## 2016-12-24 DIAGNOSIS — Z1211 Encounter for screening for malignant neoplasm of colon: Secondary | ICD-10-CM | POA: Diagnosis not present

## 2016-12-26 DIAGNOSIS — Z23 Encounter for immunization: Secondary | ICD-10-CM | POA: Diagnosis not present

## 2017-01-06 DIAGNOSIS — I788 Other diseases of capillaries: Secondary | ICD-10-CM | POA: Diagnosis not present

## 2017-01-06 DIAGNOSIS — L820 Inflamed seborrheic keratosis: Secondary | ICD-10-CM | POA: Diagnosis not present

## 2017-01-06 DIAGNOSIS — B078 Other viral warts: Secondary | ICD-10-CM | POA: Diagnosis not present

## 2017-01-06 DIAGNOSIS — L718 Other rosacea: Secondary | ICD-10-CM | POA: Diagnosis not present

## 2017-01-14 ENCOUNTER — Other Ambulatory Visit: Payer: Self-pay | Admitting: Neurosurgery

## 2017-01-14 DIAGNOSIS — R200 Anesthesia of skin: Secondary | ICD-10-CM

## 2017-01-14 DIAGNOSIS — R202 Paresthesia of skin: Secondary | ICD-10-CM

## 2017-01-14 DIAGNOSIS — M542 Cervicalgia: Secondary | ICD-10-CM

## 2017-01-15 ENCOUNTER — Other Ambulatory Visit: Payer: Self-pay | Admitting: Neurosurgery

## 2017-01-15 DIAGNOSIS — M542 Cervicalgia: Secondary | ICD-10-CM

## 2017-01-15 DIAGNOSIS — R202 Paresthesia of skin: Secondary | ICD-10-CM

## 2017-01-15 DIAGNOSIS — R200 Anesthesia of skin: Secondary | ICD-10-CM

## 2017-01-15 DIAGNOSIS — M79601 Pain in right arm: Secondary | ICD-10-CM

## 2017-01-27 ENCOUNTER — Ambulatory Visit
Admission: RE | Admit: 2017-01-27 | Discharge: 2017-01-27 | Disposition: A | Discharge status: Home / Self Care | Payer: Medicare Other | Source: Ambulatory Visit | Attending: Neurosurgery | Admitting: Neurosurgery

## 2017-01-27 DIAGNOSIS — M50223 Other cervical disc displacement at C6-C7 level: Secondary | ICD-10-CM | POA: Diagnosis not present

## 2017-01-27 DIAGNOSIS — M4802 Spinal stenosis, cervical region: Secondary | ICD-10-CM | POA: Diagnosis not present

## 2017-01-27 DIAGNOSIS — M79601 Pain in right arm: Secondary | ICD-10-CM | POA: Insufficient documentation

## 2017-01-27 DIAGNOSIS — M542 Cervicalgia: Secondary | ICD-10-CM

## 2017-01-27 DIAGNOSIS — R202 Paresthesia of skin: Secondary | ICD-10-CM | POA: Insufficient documentation

## 2017-01-27 DIAGNOSIS — R200 Anesthesia of skin: Secondary | ICD-10-CM | POA: Insufficient documentation

## 2017-02-11 ENCOUNTER — Other Ambulatory Visit: Payer: Self-pay | Admitting: Allergy

## 2017-02-17 DIAGNOSIS — L718 Other rosacea: Secondary | ICD-10-CM | POA: Diagnosis not present

## 2017-02-17 DIAGNOSIS — L570 Actinic keratosis: Secondary | ICD-10-CM | POA: Diagnosis not present

## 2017-03-25 DIAGNOSIS — L570 Actinic keratosis: Secondary | ICD-10-CM | POA: Diagnosis not present

## 2017-03-25 DIAGNOSIS — L578 Other skin changes due to chronic exposure to nonionizing radiation: Secondary | ICD-10-CM | POA: Diagnosis not present

## 2017-04-04 DIAGNOSIS — M5020 Other cervical disc displacement, unspecified cervical region: Secondary | ICD-10-CM | POA: Diagnosis not present

## 2017-04-24 DIAGNOSIS — M5020 Other cervical disc displacement, unspecified cervical region: Secondary | ICD-10-CM | POA: Diagnosis not present

## 2017-04-24 DIAGNOSIS — M542 Cervicalgia: Secondary | ICD-10-CM | POA: Diagnosis not present

## 2017-04-24 DIAGNOSIS — Z87891 Personal history of nicotine dependence: Secondary | ICD-10-CM | POA: Diagnosis not present

## 2017-04-24 DIAGNOSIS — Z881 Allergy status to other antibiotic agents status: Secondary | ICD-10-CM | POA: Diagnosis not present

## 2017-04-24 DIAGNOSIS — R200 Anesthesia of skin: Secondary | ICD-10-CM | POA: Diagnosis not present

## 2017-04-24 DIAGNOSIS — Z8249 Family history of ischemic heart disease and other diseases of the circulatory system: Secondary | ICD-10-CM | POA: Diagnosis not present

## 2017-04-24 DIAGNOSIS — M2578 Osteophyte, vertebrae: Secondary | ICD-10-CM | POA: Diagnosis not present

## 2017-04-24 DIAGNOSIS — R29890 Loss of height: Secondary | ICD-10-CM | POA: Diagnosis not present

## 2017-04-24 DIAGNOSIS — Z85828 Personal history of other malignant neoplasm of skin: Secondary | ICD-10-CM | POA: Diagnosis not present

## 2017-04-24 DIAGNOSIS — M545 Low back pain: Secondary | ICD-10-CM | POA: Diagnosis not present

## 2017-04-24 DIAGNOSIS — E785 Hyperlipidemia, unspecified: Secondary | ICD-10-CM | POA: Diagnosis not present

## 2017-04-24 DIAGNOSIS — M4802 Spinal stenosis, cervical region: Secondary | ICD-10-CM | POA: Diagnosis not present

## 2017-04-24 DIAGNOSIS — M4312 Spondylolisthesis, cervical region: Secondary | ICD-10-CM | POA: Diagnosis not present

## 2017-04-24 DIAGNOSIS — M79601 Pain in right arm: Secondary | ICD-10-CM | POA: Diagnosis not present

## 2017-04-24 DIAGNOSIS — K219 Gastro-esophageal reflux disease without esophagitis: Secondary | ICD-10-CM | POA: Diagnosis not present

## 2017-04-24 DIAGNOSIS — M50122 Cervical disc disorder at C5-C6 level with radiculopathy: Secondary | ICD-10-CM | POA: Diagnosis not present

## 2017-04-24 DIAGNOSIS — M19072 Primary osteoarthritis, left ankle and foot: Secondary | ICD-10-CM | POA: Diagnosis not present

## 2017-04-24 DIAGNOSIS — Z79899 Other long term (current) drug therapy: Secondary | ICD-10-CM | POA: Diagnosis not present

## 2017-04-24 DIAGNOSIS — M50222 Other cervical disc displacement at C5-C6 level: Secondary | ICD-10-CM | POA: Diagnosis not present

## 2017-04-24 DIAGNOSIS — R202 Paresthesia of skin: Secondary | ICD-10-CM | POA: Diagnosis not present

## 2017-04-24 DIAGNOSIS — Z981 Arthrodesis status: Secondary | ICD-10-CM | POA: Diagnosis not present

## 2017-04-24 DIAGNOSIS — Z803 Family history of malignant neoplasm of breast: Secondary | ICD-10-CM | POA: Diagnosis not present

## 2017-04-25 DIAGNOSIS — R29890 Loss of height: Secondary | ICD-10-CM | POA: Diagnosis not present

## 2017-04-25 DIAGNOSIS — Z85828 Personal history of other malignant neoplasm of skin: Secondary | ICD-10-CM | POA: Diagnosis not present

## 2017-04-25 DIAGNOSIS — M5020 Other cervical disc displacement, unspecified cervical region: Secondary | ICD-10-CM | POA: Diagnosis not present

## 2017-04-25 DIAGNOSIS — R200 Anesthesia of skin: Secondary | ICD-10-CM | POA: Diagnosis not present

## 2017-04-25 DIAGNOSIS — R202 Paresthesia of skin: Secondary | ICD-10-CM | POA: Diagnosis not present

## 2017-04-25 DIAGNOSIS — M50122 Cervical disc disorder at C5-C6 level with radiculopathy: Secondary | ICD-10-CM | POA: Diagnosis not present

## 2017-04-25 DIAGNOSIS — M79601 Pain in right arm: Secondary | ICD-10-CM | POA: Diagnosis not present

## 2017-04-25 DIAGNOSIS — M545 Low back pain: Secondary | ICD-10-CM | POA: Diagnosis not present

## 2017-04-25 DIAGNOSIS — M542 Cervicalgia: Secondary | ICD-10-CM | POA: Diagnosis not present

## 2017-04-25 DIAGNOSIS — M2578 Osteophyte, vertebrae: Secondary | ICD-10-CM | POA: Diagnosis not present

## 2017-05-14 DIAGNOSIS — H0014 Chalazion left upper eyelid: Secondary | ICD-10-CM | POA: Diagnosis not present

## 2017-05-27 DIAGNOSIS — L718 Other rosacea: Secondary | ICD-10-CM | POA: Diagnosis not present

## 2017-05-27 DIAGNOSIS — L720 Epidermal cyst: Secondary | ICD-10-CM | POA: Diagnosis not present

## 2017-05-27 DIAGNOSIS — L249 Irritant contact dermatitis, unspecified cause: Secondary | ICD-10-CM | POA: Diagnosis not present

## 2017-05-27 DIAGNOSIS — L814 Other melanin hyperpigmentation: Secondary | ICD-10-CM | POA: Diagnosis not present

## 2017-05-27 DIAGNOSIS — L821 Other seborrheic keratosis: Secondary | ICD-10-CM | POA: Diagnosis not present

## 2017-05-27 DIAGNOSIS — L578 Other skin changes due to chronic exposure to nonionizing radiation: Secondary | ICD-10-CM | POA: Diagnosis not present

## 2017-05-27 DIAGNOSIS — L570 Actinic keratosis: Secondary | ICD-10-CM | POA: Diagnosis not present

## 2017-05-27 DIAGNOSIS — Z1283 Encounter for screening for malignant neoplasm of skin: Secondary | ICD-10-CM | POA: Diagnosis not present

## 2017-05-27 DIAGNOSIS — L820 Inflamed seborrheic keratosis: Secondary | ICD-10-CM | POA: Diagnosis not present

## 2017-05-27 DIAGNOSIS — Z85828 Personal history of other malignant neoplasm of skin: Secondary | ICD-10-CM | POA: Diagnosis not present

## 2017-05-27 DIAGNOSIS — L728 Other follicular cysts of the skin and subcutaneous tissue: Secondary | ICD-10-CM | POA: Diagnosis not present

## 2017-06-05 DIAGNOSIS — M5417 Radiculopathy, lumbosacral region: Secondary | ICD-10-CM | POA: Diagnosis not present

## 2017-06-11 DIAGNOSIS — M48062 Spinal stenosis, lumbar region with neurogenic claudication: Secondary | ICD-10-CM | POA: Diagnosis not present

## 2017-06-13 DIAGNOSIS — M5020 Other cervical disc displacement, unspecified cervical region: Secondary | ICD-10-CM | POA: Diagnosis not present

## 2017-06-13 DIAGNOSIS — Z1231 Encounter for screening mammogram for malignant neoplasm of breast: Secondary | ICD-10-CM | POA: Diagnosis not present

## 2017-06-23 DIAGNOSIS — L570 Actinic keratosis: Secondary | ICD-10-CM | POA: Diagnosis not present

## 2017-07-01 DIAGNOSIS — N6489 Other specified disorders of breast: Secondary | ICD-10-CM | POA: Diagnosis not present

## 2017-07-02 DIAGNOSIS — L02612 Cutaneous abscess of left foot: Secondary | ICD-10-CM | POA: Diagnosis not present

## 2017-07-02 LAB — HM MAMMOGRAPHY

## 2017-07-09 DIAGNOSIS — Z5181 Encounter for therapeutic drug level monitoring: Secondary | ICD-10-CM | POA: Diagnosis not present

## 2017-07-14 DIAGNOSIS — M216X2 Other acquired deformities of left foot: Secondary | ICD-10-CM | POA: Diagnosis not present

## 2017-07-14 DIAGNOSIS — L02612 Cutaneous abscess of left foot: Secondary | ICD-10-CM | POA: Diagnosis not present

## 2017-10-16 DIAGNOSIS — L820 Inflamed seborrheic keratosis: Secondary | ICD-10-CM | POA: Diagnosis not present

## 2017-10-16 DIAGNOSIS — L309 Dermatitis, unspecified: Secondary | ICD-10-CM | POA: Diagnosis not present

## 2017-11-05 DIAGNOSIS — I10 Essential (primary) hypertension: Secondary | ICD-10-CM | POA: Diagnosis not present

## 2017-11-05 DIAGNOSIS — E7800 Pure hypercholesterolemia, unspecified: Secondary | ICD-10-CM | POA: Diagnosis not present

## 2018-01-05 DIAGNOSIS — Z23 Encounter for immunization: Secondary | ICD-10-CM | POA: Diagnosis not present

## 2018-01-13 DIAGNOSIS — L821 Other seborrheic keratosis: Secondary | ICD-10-CM | POA: Diagnosis not present

## 2018-01-13 DIAGNOSIS — L820 Inflamed seborrheic keratosis: Secondary | ICD-10-CM | POA: Diagnosis not present

## 2018-03-09 ENCOUNTER — Other Ambulatory Visit: Payer: Self-pay

## 2018-03-16 ENCOUNTER — Ambulatory Visit: Payer: Medicare Other | Admitting: Internal Medicine

## 2018-04-23 ENCOUNTER — Other Ambulatory Visit: Payer: Self-pay | Admitting: Orthopedic Surgery

## 2018-04-23 DIAGNOSIS — M48062 Spinal stenosis, lumbar region with neurogenic claudication: Secondary | ICD-10-CM

## 2018-04-30 LAB — BASIC METABOLIC PANEL
Creatinine: 0.9 (ref 0.5–1.1)
Glucose: 79
POTASSIUM: 4.1 (ref 3.4–5.3)
SODIUM: 138 (ref 137–147)

## 2018-04-30 LAB — CBC AND DIFFERENTIAL: Hemoglobin: 11.4 — AB (ref 12.0–16.0)

## 2018-04-30 LAB — VITAMIN D 25 HYDROXY (VIT D DEFICIENCY, FRACTURES): Vit D, 25-Hydroxy: 29

## 2018-04-30 LAB — HEPATIC FUNCTION PANEL
ALT: 16 (ref 7–35)
AST: 29 (ref 13–35)
Alkaline Phosphatase: 46 (ref 25–125)
Bilirubin, Total: 0.8

## 2018-04-30 LAB — TSH: TSH: 1.13 (ref 0.41–5.90)

## 2018-04-30 LAB — LIPID PANEL
Cholesterol: 183 (ref 0–200)
HDL: 77 — AB (ref 35–70)
LDL Cholesterol: 95
Triglycerides: 56 (ref 40–160)

## 2018-05-03 ENCOUNTER — Ambulatory Visit
Admission: RE | Admit: 2018-05-03 | Discharge: 2018-05-03 | Disposition: A | Discharge status: Home / Self Care | Payer: Medicare Other | Source: Ambulatory Visit | Attending: Orthopedic Surgery | Admitting: Orthopedic Surgery

## 2018-05-03 DIAGNOSIS — M48062 Spinal stenosis, lumbar region with neurogenic claudication: Secondary | ICD-10-CM | POA: Insufficient documentation

## 2018-05-15 ENCOUNTER — Ambulatory Visit: Payer: Medicare Other | Admitting: Internal Medicine

## 2018-05-27 NOTE — Discharge Instructions (Signed)
[facility] °[facility] ° °POST OPERATIVE INSTRUCTIONS FOR DR. TROXLER AND DR. FOWLER °[facility] ° ° °1. Take your medication as prescribed.  Pain medication should be taken only as needed. ° °2. Keep the dressing clean, dry and intact. ° °3. Keep your foot elevated above the heart level for the first 48 hours. ° °4. Walking to the bathroom and brief periods of walking are acceptable, unless we have instructed you to be non-weight bearing. ° °5. Always wear your post-op shoe when walking.  Always use your crutches if you are to be non-weight bearing. ° °6. Do not take a shower. Baths are permissible as long as the foot is kept out of the water.  ° °7. Every hour you are awake:  °- Bend your knee 15 times. °- Flex foot 15 times °- Massage calf 15 times ° °8. Call [facility] ([phone]) if any of the following problems occur: °- You develop a temperature or fever. °- The bandage becomes saturated with blood. °- Medication does not stop your pain. °- Injury of the foot occurs. °- Any symptoms of infection including redness, odor, or red streaks running from wound. ° ° °General Anesthesia, Adult, Care After °This sheet gives you information about how to care for yourself after your procedure. Your health care provider may also give you more specific instructions. If you have problems or questions, contact your health care provider. °What can I expect after the procedure? °After the procedure, the following side effects are common: °· Pain or discomfort at the IV site. °· Nausea. °· Vomiting. °· Sore throat. °· Trouble concentrating. °· Feeling cold or chills. °· Weak or tired. °· Sleepiness and fatigue. °· Soreness and body aches. These side effects can affect parts of the body that were not involved in surgery. °Follow these instructions at home: ° °For at least 24 hours after the procedure: °· Have a responsible adult stay with you. It is important to  have someone help care for you until you are awake and alert. °· Rest as needed. °· Do not: °? Participate in activities in which you could fall or become injured. °? Drive. °? Use heavy machinery. °? Drink alcohol. °? Take sleeping pills or medicines that cause drowsiness. °? Make important decisions or sign legal documents. °? Take care of children on your own. °Eating and drinking °· Follow any instructions from your health care provider about eating or drinking restrictions. °· When you feel hungry, start by eating small amounts of foods that are soft and easy to digest (bland), such as toast. Gradually return to your regular diet. °· Drink enough fluid to keep your urine pale yellow. °· If you vomit, rehydrate by drinking water, juice, or clear broth. °General instructions °· If you have sleep apnea, surgery and certain medicines can increase your risk for breathing problems. Follow instructions from your health care provider about wearing your sleep device: °? Anytime you are sleeping, including during daytime naps. °? While taking prescription pain medicines, sleeping medicines, or medicines that make you drowsy. °· Return to your normal activities as told by your health care provider. Ask your health care provider what activities are safe for you. °· Take over-the-counter and prescription medicines only as told by your health care provider. °· If you smoke, do not smoke without supervision. °· Keep all follow-up visits as told by your health care provider. This is important. °Contact a health care provider if: °· You have   nausea or vomiting that does not get better with medicine. °· You cannot eat or drink without vomiting. °· You have pain that does not get better with medicine. °· You are unable to pass urine. °· You develop a skin rash. °· You have a fever. °· You have redness around your IV site that gets worse. °Get help right away if: °· You have difficulty breathing. °· You have chest pain. °· You  have blood in your urine or stool, or you vomit blood. °Summary °· After the procedure, it is common to have a sore throat or nausea. It is also common to feel tired. °· Have a responsible adult stay with you for the first 24 hours after general anesthesia. It is important to have someone help care for you until you are awake and alert. °· When you feel hungry, start by eating small amounts of foods that are soft and easy to digest (bland), such as toast. Gradually return to your regular diet. °· Drink enough fluid to keep your urine pale yellow. °· Return to your normal activities as told by your health care provider. Ask your health care provider what activities are safe for you. °This information is not intended to replace advice given to you by your health care provider. Make sure you discuss any questions you have with your health care provider. °Document Released: 07/15/2000 Document Revised: 11/22/2016 Document Reviewed: 11/22/2016 °Elsevier Interactive Patient Education © 2019 [employer] ° °

## 2018-06-02 ENCOUNTER — Ambulatory Visit: Payer: Medicare Other | Admitting: Internal Medicine

## 2018-06-02 ENCOUNTER — Other Ambulatory Visit: Payer: Self-pay | Admitting: Podiatry

## 2018-06-04 ENCOUNTER — Ambulatory Visit
Admission: RE | Admit: 2018-06-04 | Discharge: 2018-06-04 | Disposition: A | Discharge status: Home / Self Care | Payer: Medicare Other | Attending: Podiatry | Admitting: Podiatry

## 2018-06-04 ENCOUNTER — Encounter
Admission: RE | Disposition: A | Discharge status: Home / Self Care | Payer: Self-pay | Source: Home / Self Care | Attending: Podiatry

## 2018-06-04 ENCOUNTER — Ambulatory Visit: Payer: Medicare Other | Admitting: Anesthesiology

## 2018-06-04 DIAGNOSIS — M1990 Unspecified osteoarthritis, unspecified site: Secondary | ICD-10-CM | POA: Diagnosis not present

## 2018-06-04 DIAGNOSIS — Z881 Allergy status to other antibiotic agents status: Secondary | ICD-10-CM | POA: Diagnosis not present

## 2018-06-04 DIAGNOSIS — Z823 Family history of stroke: Secondary | ICD-10-CM | POA: Insufficient documentation

## 2018-06-04 DIAGNOSIS — G4700 Insomnia, unspecified: Secondary | ICD-10-CM | POA: Diagnosis not present

## 2018-06-04 DIAGNOSIS — Z9841 Cataract extraction status, right eye: Secondary | ICD-10-CM | POA: Insufficient documentation

## 2018-06-04 DIAGNOSIS — I10 Essential (primary) hypertension: Secondary | ICD-10-CM | POA: Insufficient documentation

## 2018-06-04 DIAGNOSIS — Z981 Arthrodesis status: Secondary | ICD-10-CM | POA: Insufficient documentation

## 2018-06-04 DIAGNOSIS — Z85828 Personal history of other malignant neoplasm of skin: Secondary | ICD-10-CM | POA: Diagnosis not present

## 2018-06-04 DIAGNOSIS — K219 Gastro-esophageal reflux disease without esophagitis: Secondary | ICD-10-CM | POA: Insufficient documentation

## 2018-06-04 DIAGNOSIS — Z8371 Family history of colonic polyps: Secondary | ICD-10-CM | POA: Insufficient documentation

## 2018-06-04 DIAGNOSIS — Z820 Family history of epilepsy and other diseases of the nervous system: Secondary | ICD-10-CM | POA: Insufficient documentation

## 2018-06-04 DIAGNOSIS — Z803 Family history of malignant neoplasm of breast: Secondary | ICD-10-CM | POA: Diagnosis not present

## 2018-06-04 DIAGNOSIS — M899 Disorder of bone, unspecified: Secondary | ICD-10-CM | POA: Insufficient documentation

## 2018-06-04 DIAGNOSIS — Z8261 Family history of arthritis: Secondary | ICD-10-CM | POA: Insufficient documentation

## 2018-06-04 DIAGNOSIS — Z8042 Family history of malignant neoplasm of prostate: Secondary | ICD-10-CM | POA: Diagnosis not present

## 2018-06-04 DIAGNOSIS — Z9842 Cataract extraction status, left eye: Secondary | ICD-10-CM | POA: Insufficient documentation

## 2018-06-04 DIAGNOSIS — E785 Hyperlipidemia, unspecified: Secondary | ICD-10-CM | POA: Insufficient documentation

## 2018-06-04 DIAGNOSIS — Z87891 Personal history of nicotine dependence: Secondary | ICD-10-CM | POA: Diagnosis not present

## 2018-06-04 DIAGNOSIS — Z90710 Acquired absence of both cervix and uterus: Secondary | ICD-10-CM | POA: Insufficient documentation

## 2018-06-04 DIAGNOSIS — Z888 Allergy status to other drugs, medicaments and biological substances status: Secondary | ICD-10-CM | POA: Diagnosis not present

## 2018-06-04 DIAGNOSIS — Z825 Family history of asthma and other chronic lower respiratory diseases: Secondary | ICD-10-CM | POA: Insufficient documentation

## 2018-06-04 DIAGNOSIS — Z79899 Other long term (current) drug therapy: Secondary | ICD-10-CM | POA: Diagnosis not present

## 2018-06-04 HISTORY — PX: BONE EXCISION: SHX6730

## 2018-06-04 HISTORY — DX: Myoneural disorder, unspecified: G70.9

## 2018-06-04 HISTORY — DX: Asymptomatic varicose veins of unspecified lower extremity: I83.90

## 2018-06-04 SURGERY — BONE EXCISION
Anesthesia: General | Site: Foot | Laterality: Right

## 2018-06-04 MED ORDER — OXYCODONE HCL 5 MG/5ML PO SOLN: 5.0000 mg | Freq: Once | ORAL | Status: DC | PRN

## 2018-06-04 MED ORDER — MIDAZOLAM HCL 2 MG/2ML IJ SOLN
INTRAMUSCULAR | Status: DC | PRN
  Administered 2018-06-04: 2 mg via INTRAVENOUS

## 2018-06-04 MED ORDER — PROPOFOL 500 MG/50ML IV EMUL
INTRAVENOUS | Status: DC | PRN
  Administered 2018-06-04: 120 ug/kg/min via INTRAVENOUS

## 2018-06-04 MED ORDER — FENTANYL CITRATE (PF) 100 MCG/2ML IJ SOLN: 25.0000 ug | INTRAMUSCULAR | Status: DC | PRN

## 2018-06-04 MED ORDER — HYDROCODONE-ACETAMINOPHEN 5-325 MG PO TABS: 1.0000 | ORAL_TABLET | Freq: Four times a day (QID) | ORAL | 0 refills | Status: DC | PRN

## 2018-06-04 MED ORDER — LACTATED RINGERS IV SOLN
INTRAVENOUS | Status: DC | PRN
  Administered 2018-06-04: 10:00:00 via INTRAVENOUS

## 2018-06-04 MED ORDER — CEFAZOLIN SODIUM-DEXTROSE 2-4 GM/100ML-% IV SOLN
2.0000 g | INTRAVENOUS | Status: AC
  Administered 2018-06-04: 2 g via INTRAVENOUS

## 2018-06-04 MED ORDER — DEXAMETHASONE SODIUM PHOSPHATE 10 MG/ML IJ SOLN
INTRAMUSCULAR | Status: DC | PRN
  Administered 2018-06-04: 4 mg via INTRAVENOUS

## 2018-06-04 MED ORDER — ACETAMINOPHEN 325 MG PO TABS: 325.0000 mg | ORAL_TABLET | ORAL | Status: DC | PRN

## 2018-06-04 MED ORDER — PROPOFOL 10 MG/ML IV BOLUS
INTRAVENOUS | Status: DC | PRN
  Administered 2018-06-04: 20 mg via INTRAVENOUS
  Administered 2018-06-04: 30 mg via INTRAVENOUS

## 2018-06-04 MED ORDER — OXYCODONE HCL 5 MG PO TABS: 5.0000 mg | ORAL_TABLET | Freq: Once | ORAL | Status: DC | PRN

## 2018-06-04 MED ORDER — LIDOCAINE HCL 1 % IJ SOLN
INTRAMUSCULAR | Status: DC | PRN
  Administered 2018-06-04: 40 mg via INTRADERMAL

## 2018-06-04 MED ORDER — BUPIVACAINE HCL (PF) 0.25 % IJ SOLN
INTRAMUSCULAR | Status: DC | PRN
  Administered 2018-06-04: 2 mL
  Administered 2018-06-04: 4 mL

## 2018-06-04 MED ORDER — ACETAMINOPHEN 160 MG/5ML PO SOLN: 325.0000 mg | ORAL | Status: DC | PRN

## 2018-06-04 MED ORDER — ONDANSETRON HCL 4 MG/2ML IJ SOLN
4.0000 mg | Freq: Once | INTRAMUSCULAR | Status: AC | PRN
  Administered 2018-06-04: 4 mg via INTRAVENOUS

## 2018-06-04 MED ORDER — POVIDONE-IODINE 7.5 % EX SOLN: Freq: Once | CUTANEOUS | Status: DC

## 2018-06-04 MED ORDER — ONDANSETRON HCL 4 MG/2ML IJ SOLN
INTRAMUSCULAR | Status: DC | PRN
  Administered 2018-06-04: 4 mg via INTRAVENOUS

## 2018-06-04 MED ORDER — FENTANYL CITRATE (PF) 100 MCG/2ML IJ SOLN
INTRAMUSCULAR | Status: DC | PRN
  Administered 2018-06-04 (×2): 50 ug via INTRAVENOUS

## 2018-06-04 SURGICAL SUPPLY — 26 items
BLADE OSC/SAGITTAL MD 5.5X18 (BLADE) ×2 IMPLANT
BLADE SURG MINI STRL (BLADE) ×2 IMPLANT
BNDG ESMARK 4X12 TAN STRL LF (GAUZE/BANDAGES/DRESSINGS) ×2 IMPLANT
BNDG GAUZE 4.5X4.1 6PLY STRL (MISCELLANEOUS) ×2 IMPLANT
BNDG STRETCH 4X75 STRL LF (GAUZE/BANDAGES/DRESSINGS) ×2 IMPLANT
CUFF TOURN SGL QUICK 18 (TOURNIQUET CUFF) ×2 IMPLANT
DURAPREP 26ML APPLICATOR (WOUND CARE) ×2 IMPLANT
ELECT REM PT RETURN 9FT ADLT (ELECTROSURGICAL) ×2
ELECTRODE REM PT RTRN 9FT ADLT (ELECTROSURGICAL) ×1 IMPLANT
GAUZE PETRO XEROFOAM 1X8 (MISCELLANEOUS) ×2 IMPLANT
GAUZE SPONGE 4X4 12PLY STRL (GAUZE/BANDAGES/DRESSINGS) ×2 IMPLANT
GLOVE BIO SURGEON STRL SZ8 (GLOVE) ×4 IMPLANT
GLOVE INDICATOR 7.5 STRL GRN (GLOVE) ×2 IMPLANT
GOWN STRL REUS W/ TWL LRG LVL3 (GOWN DISPOSABLE) ×1 IMPLANT
GOWN STRL REUS W/ TWL XL LVL3 (GOWN DISPOSABLE) ×1 IMPLANT
GOWN STRL REUS W/TWL LRG LVL3 (GOWN DISPOSABLE) ×1
GOWN STRL REUS W/TWL XL LVL3 (GOWN DISPOSABLE) ×1
KIT TURNOVER KIT A (KITS) ×2 IMPLANT
NEEDLE HYPO 25GX1X1/2 BEV (NEEDLE) ×2 IMPLANT
NS IRRIG 500ML POUR BTL (IV SOLUTION) ×2 IMPLANT
PACK EXTREMITY ARMC (MISCELLANEOUS) ×2 IMPLANT
RASP SM TEAR CROSS CUT (RASP) ×2 IMPLANT
STOCKINETTE STRL 6IN 960660 (GAUZE/BANDAGES/DRESSINGS) ×2 IMPLANT
SUT ETHILON 5-0 FS-2 18 BLK (SUTURE) ×2 IMPLANT
SUT VIC AB 4-0 FS2 27 (SUTURE) ×2 IMPLANT
SYR 10ML LL (SYRINGE) ×2 IMPLANT

## 2018-06-04 NOTE — Anesthesia Postprocedure Evaluation (Signed)
Anesthesia Post Note  Patient: Jessica Haas  Procedure(s) Performed: PARTIAL EXICSION BONE-PHALANX 5TH TOE (Right Foot)  Patient location during evaluation: PACU Anesthesia Type: General Level of consciousness: awake and alert and oriented Pain management: satisfactory to patient Vital Signs Assessment: post-procedure vital signs reviewed and stable Respiratory status: spontaneous breathing, nonlabored ventilation and respiratory function stable Cardiovascular status: blood pressure returned to baseline and stable Postop Assessment: Adequate PO intake and No signs of nausea or vomiting Anesthetic complications: no    Raliegh Ip

## 2018-06-04 NOTE — Op Note (Signed)
Operative note   Surgeon: Dr. Albertine Patricia, DPM.    Assistant: None    Preop diagnosis: Exostosis fifth toe right foot    Postop diagnosis: Same    Procedure:   1.  Removal partial phalanx middle phalanx fifth toe right foot          EBL: Less than 5 cc    Anesthesia:IV sedation delivered by anesthesia team I injected 2 cc of 0.25% Marcaine plain before the surgery and after the surgery at the base of the toe    Hemostasis: Ankle tourniquet 2 and 25 mils mercury pressure    Specimen: None    Complications: None    Operative indications: Chronic pain unresponsive to conservative care on the right fifth toe    Procedure:  Patient was brought into the OR and placed on the operating table in thesupine position. After anesthesia was obtained theright lower extremity was prepped and draped in usual sterile fashion.  Operative Report: At this time attention directed the fifth toe where a 1 cm linear incision made over the dorsal lateral aspect of the PIP joint of the digit.  This incision was then deepened with blunt dissection capsule tissue overlying that joint was identified and incised longitudinally down to the bone.  This was freed from the medial aspect of the middle phalanx and the residual proximal phalanx head.  This point a sagittal saw was used to resect the medial one fourth of the middle phalanx and a small portion of the residual proximal phalanx head medially.  This bone was removed and a power rasp was utilized to file down any bony prominences in the region.  Once this was accomplished there was copiously irrigated checked FluoroScan good reduction of bone proliferation was noted in the region.  After another irrigation capsule tissue was then closed with 4-0 Vicryl in continuous stitch and skin was then closed with 5-0 nylon horizontal mattress and simple interrupted combination.  A sterile compressive dressings were placed across the wound consisting of Xeroform gauze  4 x 4's and Kling.  Tourniquet is released prior to complete vascular nursing return to all digits of the right foot at that time frame.    Patient tolerated the procedure and anesthesia well.  Was transported from the OR to the PACU with all vital signs stable and vascular status intact. To be discharged per routine protocol.  Will follow up in approximately 1 week in the outpatient clinic.

## 2018-06-04 NOTE — H&P (Signed)
H and P has been reviewed and no changes are noted.  

## 2018-06-04 NOTE — Anesthesia Procedure Notes (Signed)
Procedure Name: MAC Date/Time: 06/04/2018 10:26 AM Performed by: Janna Arch, CRNA Pre-anesthesia Checklist: Patient identified, Emergency Drugs available, Suction available, Patient being monitored and Timeout performed Patient Re-evaluated:Patient Re-evaluated prior to induction Oxygen Delivery Method: Simple face mask Placement Confirmation: positive ETCO2 and breath sounds checked- equal and bilateral

## 2018-06-04 NOTE — Transfer of Care (Signed)
Immediate Anesthesia Transfer of Care Note  Patient: Jessica Haas  Procedure(s) Performed: PARTIAL EXICSION BONE-PHALANX 5TH TOE (Right Foot)  Patient Location: PACU  Anesthesia Type: General  Level of Consciousness: awake, alert  and patient cooperative  Airway and Oxygen Therapy: Patient Spontanous Breathing and Patient connected to supplemental oxygen  Post-op Assessment: Post-op Vital signs reviewed, Patient's Cardiovascular Status Stable, Respiratory Function Stable, Patent Airway and No signs of Nausea or vomiting  Post-op Vital Signs: Reviewed and stable  Complications: No apparent anesthesia complications

## 2018-06-04 NOTE — Anesthesia Preprocedure Evaluation (Signed)
Anesthesia Evaluation  Patient identified by MRN, date of birth, ID band Patient awake    Reviewed: Allergy & Precautions, H&P , NPO status , Patient's Chart, lab work & pertinent test results  Airway Mallampati: II  TM Distance: >3 FB Neck ROM: full    Dental no notable dental hx.    Pulmonary former smoker,    Pulmonary exam normal breath sounds clear to auscultation       Cardiovascular Normal cardiovascular exam Rhythm:regular Rate:Normal     Neuro/Psych PSYCHIATRIC DISORDERS  Neuromuscular disease    GI/Hepatic GERD  ,  Endo/Other    Renal/GU      Musculoskeletal   Abdominal   Peds  Hematology   Anesthesia Other Findings   Reproductive/Obstetrics                             Anesthesia Physical Anesthesia Plan  ASA: II  Anesthesia Plan: General   Post-op Pain Management:    Induction:   PONV Risk Score and Plan: 3 and Treatment may vary due to age or medical condition, Propofol infusion, Ondansetron and Dexamethasone  Airway Management Planned:   Additional Equipment:   Intra-op Plan:   Post-operative Plan:   Informed Consent: I have reviewed the patients History and Physical, chart, labs and discussed the procedure including the risks, benefits and alternatives for the proposed anesthesia with the patient or authorized representative who has indicated his/her understanding and acceptance.       Plan Discussed with: CRNA  Anesthesia Plan Comments:         Anesthesia Quick Evaluation

## 2018-06-23 ENCOUNTER — Encounter: Payer: Self-pay | Admitting: Internal Medicine

## 2018-06-23 ENCOUNTER — Ambulatory Visit (INDEPENDENT_AMBULATORY_CARE_PROVIDER_SITE_OTHER): Payer: Medicare Other | Admitting: Internal Medicine

## 2018-06-23 DIAGNOSIS — R030 Elevated blood-pressure reading, without diagnosis of hypertension: Secondary | ICD-10-CM | POA: Diagnosis not present

## 2018-06-23 DIAGNOSIS — E785 Hyperlipidemia, unspecified: Secondary | ICD-10-CM

## 2018-06-23 DIAGNOSIS — M48062 Spinal stenosis, lumbar region with neurogenic claudication: Secondary | ICD-10-CM | POA: Diagnosis not present

## 2018-06-23 MED ORDER — METHOCARBAMOL 500 MG PO TABS: 500.0000 mg | ORAL_TABLET | Freq: Four times a day (QID) | ORAL | 2 refills | Status: DC

## 2018-06-23 NOTE — Patient Instructions (Addendum)
Good to see you!!!    For nonpharmacologic ways to manage insomnia:  Try using Headspace   For insomnia  (it's an app on your phone)   If you start the robaxin,  Reduce the ambien dose to 1/2 to prevent oversedation    Try suspend hte hctz daily for one week,  And weigh your self daily  Check BP   You do not need medication for  BP 130/80 or less

## 2018-06-23 NOTE — Progress Notes (Signed)
Subjective:  Patient ID: Jessica Haas, female    DOB: 09/24/1950  Age: 68 y.o. MRN: [id number]  CC: Diagnoses of Spinal stenosis, lumbar region, with neurogenic claudication, Hyperlipidemia, mild, and White coat syndrome without hypertension were pertinent to this visit.  HPI Jessica Haas presents for FOLLOW UP on hypertension,  chronic insomnia  and spinal stenosis.  Last seen in 2017  S/p c5-7 ACDF by Haglund  In 2019 .   Chronic leg pain secondary to spinal stenosis  RECENT LUMBAR MRI DONE JAN 2020  Advised  to exhaust nonsurgical modalities before considering a major surgery  given  her severe scoliosis . Primary symptoms is numbness. of left shin  Has had multiple foot surgeries by Troxler  [facility] March 2019 [facility]  2010 , done in Sept 2019 at outside facility by [facility]  Bilateral cataract surgery by Porfilio 2019    Treating white coat hypertension with hctz 12.5 mg which drops her bp into the systolics.  States that her highest readings in doctor's offices has been [id number] systolic.    taking pitavastatin  For cholesterol,  Discussed need     Outpatient Medications Prior to Visit  Medication Sig Dispense Refill  . acetaminophen (TYLENOL) 500 MG tablet Take 500 mg by mouth 2 (two) times daily. Patient states she is taking everyday    . ALPRAZolam (XANAX) 0.25 MG tablet Take 1 tablet (0.25 mg total) by mouth at bedtime as needed for sleep. (Patient taking differently: Take 0.25 mg by mouth at bedtime as needed for sleep. TAKES 1/2 AS NEEDED) 30 tablet 5  . celecoxib (CELEBREX) 200 MG capsule Take 1 capsule (200 mg total) by mouth daily. 90 capsule 2  . Cholecalciferol (VITAMIN D) 2000 units CAPS Take 1 capsule by mouth daily.    . Estradiol (VAGIFEM) 10 MCG TABS vaginal tablet Place 10 mcg vaginally once a week.    Marland Kitchen FLUoxetine (PROZAC) 20 MG tablet Take one by mouth 3 times a week. (Patient taking differently: Take 20 mg by mouth daily. ) 36  tablet 3  . hydrochlorothiazide (MICROZIDE) 12.5 MG capsule Take 12.5 mg by mouth daily.    Marland Kitchen HYDROcodone-acetaminophen (NORCO) 5-325 MG tablet Take 1 tablet by mouth every 6 (six) hours as needed for moderate pain. 30 tablet 0  . Ivermectin (SOOLANTRA) 1 % CREA Apply topically.    . Pitavastatin Calcium 1 MG TABS Take 1 mg by mouth.    . traZODone (DESYREL) 50 MG tablet Take 50 mg by mouth at bedtime.    Marland Kitchen zolpidem (AMBIEN CR) 12.5 MG CR tablet TAKE 1 TABLET BY MOUTH AT BEDTIME AS NEEDED 30 tablet 0  . estradiol (VIVELLE-DOT) 0.025 MG/24HR Place 0.025 patches onto the skin once a week.    . conjugated estrogens (PREMARIN) vaginal cream Use 2 times a week.     No facility-administered medications prior to visit.     Review of Systems;  Patient denies headache, fevers, malaise, unintentional weight loss, skin rash, eye pain, sinus congestion and sinus pain, sore throat, dysphagia,  hemoptysis , cough, dyspnea, wheezing, chest pain, palpitations, orthopnea, edema, abdominal pain, nausea, melena, diarrhea, constipation, flank pain, dysuria, hematuria, urinary  Frequency, nocturia, numbness, tingling, seizures,  Focal weakness, Loss of consciousness,  Tremor, insomnia, depression, anxiety, and suicidal ideation.      Objective:  BP 98/62 (BP Location: Left Arm, Patient Position: Sitting, Cuff Size: Normal)   Pulse 78   Temp 97.8 F (36.6 C) (Oral)  Resp 15   Ht 5\' 6"  (1.676 m)   Wt 137 lb (62.1 kg)   SpO2 96%   BMI 22.11 kg/m   BP Readings from Last 3 Encounters:  06/23/18 98/62  06/04/18 99/67  02/13/16 128/86    Wt Readings from Last 3 Encounters:  06/23/18 137 lb (62.1 kg)  06/04/18 134 lb (60.8 kg)  02/07/16 136 lb (61.7 kg)    General appearance: alert, cooperative and appears stated age Ears: normal TM's and external ear canals both ears Throat: lips, mucosa, and tongue normal; teeth and gums normal Neck: no adenopathy, no carotid bruit, supple, symmetrical, trachea  midline and thyroid not enlarged, symmetric, no tenderness/mass/nodules Back: symmetric, no curvature. ROM normal. No CVA tenderness. Lungs: clear to auscultation bilaterally Heart: regular rate and rhythm, S1, S2 normal, no murmur, click, rub or gallop Abdomen: soft, non-tender; bowel sounds normal; no masses,  no organomegaly Pulses: 2+ and symmetric Skin: Skin color, texture, turgor normal. No rashes or lesions Lymph nodes: Cervical, supraclavicular, and axillary nodes normal.  No results found for: HGBA1C  Lab Results  Component Value Date   CREATININE 0.9 04/30/2018   CREATININE 0.8 04/27/2012   CREATININE 0.8 03/28/2011    Lab Results  Component Value Date   WBC 3.2 (L) 05/21/2012   HGB 11.4 (A) 04/30/2018   HCT 37.8 05/21/2012   PLT 216.0 05/21/2012   GLUCOSE 80 04/27/2012   CHOL 183 04/30/2018   TRIG 56 04/30/2018   HDL 77 (A) 04/30/2018   LDLDIRECT 135.2 04/27/2012   LDLCALC 95 04/30/2018   ALT 16 04/30/2018   AST 29 04/30/2018   NA 138 04/30/2018   K 4.1 04/30/2018   CL 105 04/27/2012   CREATININE 0.9 04/30/2018   BUN 19 04/27/2012   CO2 28 04/27/2012   TSH 1.13 04/30/2018    No results found.  Assessment & Plan:   Problem List Items Addressed This Visit    White coat syndrome without hypertension    Advised to suspend hctz given hypotension noted today and at home        Spinal stenosis, lumbar region, with neurogenic claudication    Agree with getting 2nd opinion from [facility] neurosurgery regarding treatment plan. She currently has no foot drop or strength deficits.         Relevant Medications   methocarbamol (ROBAXIN) 500 MG tablet   Hyperlipidemia, mild    She is tolerating statin therapy for management of mild hyperlipidemia with  low risk of CAD based on [facility]    Lab Results  Component Value Date   CHOL 183 04/30/2018   HDL 77 (A) 04/30/2018   LDLCALC 95 04/30/2018   LDLDIRECT 135.2 04/27/2012   TRIG 56 04/30/2018   CHOLHDL 3 04/27/2012              A total of 40 minutes was spent with patient more than half of which was spent in counseling patient on the above mentioned issues , reviewing and explaining recent labs and imaging studies done, and coordination of care.   I have discontinued Sabrinia B. Brotzman's conjugated estrogens. I am also having her start on methocarbamol. Additionally, I am having her maintain her FLUoxetine, celecoxib, ALPRAZolam, zolpidem, Vitamin D, acetaminophen, estradiol, Estradiol, hydrochlorothiazide, Pitavastatin Calcium, Ivermectin, traZODone, and HYDROcodone-acetaminophen.  Meds ordered this encounter  Medications  . methocarbamol (ROBAXIN) 500 MG tablet    Sig: Take 1 tablet (500 mg total) by mouth 4 (four) times daily.    Dispense:  60 tablet    Refill:  2    Medications Discontinued During This Encounter  Medication Reason  . conjugated estrogens (PREMARIN) vaginal cream Patient has not taken in last 30 days    Follow-up: No follow-ups on file.   Crecencio Mc, MD

## 2018-06-24 ENCOUNTER — Encounter: Payer: Self-pay | Admitting: Internal Medicine

## 2018-06-24 DIAGNOSIS — R030 Elevated blood-pressure reading, without diagnosis of hypertension: Secondary | ICD-10-CM | POA: Insufficient documentation

## 2018-06-24 NOTE — Assessment & Plan Note (Signed)
She is tolerating statin therapy for management of mild hyperlipidemia with  low risk of CAD based on [facility]    Lab Results  Component Value Date   CHOL 183 04/30/2018   HDL 77 (A) 04/30/2018   LDLCALC 95 04/30/2018   LDLDIRECT 135.2 04/27/2012   TRIG 56 04/30/2018   CHOLHDL 3 04/27/2012

## 2018-06-24 NOTE — Assessment & Plan Note (Signed)
Advised to suspend hctz given hypotension noted today and at home

## 2018-06-24 NOTE — Assessment & Plan Note (Signed)
Agree with getting 2nd opinion from [facility] neurosurgery regarding treatment plan. She currently has no foot drop or strength deficits.

## 2019-02-28 ENCOUNTER — Other Ambulatory Visit: Payer: Self-pay | Admitting: Internal Medicine

## 2019-02-28 MED ORDER — TIZANIDINE HCL 4 MG PO TABS: 4.0000 mg | ORAL_TABLET | Freq: Four times a day (QID) | ORAL | 2 refills | Status: DC | PRN

## 2019-03-01 MED ORDER — TIZANIDINE HCL 4 MG PO TABS: 4.0000 mg | ORAL_TABLET | Freq: Four times a day (QID) | ORAL | 2 refills | Status: DC | PRN

## 2019-03-23 MED ORDER — METHOCARBAMOL 500 MG PO TABS: 500.0000 mg | ORAL_TABLET | Freq: Three times a day (TID) | ORAL | 3 refills | Status: DC | PRN

## 2019-05-14 ENCOUNTER — Ambulatory Visit: Payer: Medicare Other

## 2019-05-15 ENCOUNTER — Other Ambulatory Visit: Payer: Self-pay | Admitting: Internal Medicine

## 2019-06-04 ENCOUNTER — Other Ambulatory Visit: Payer: Self-pay | Admitting: Internal Medicine

## 2019-06-04 NOTE — Telephone Encounter (Signed)
Medication was discontinued on 03/23/2019. Last OV: 06/23/2018 Next OV: not scheduled

## 2019-06-21 ENCOUNTER — Other Ambulatory Visit: Payer: Self-pay | Admitting: Internal Medicine

## 2019-07-12 ENCOUNTER — Other Ambulatory Visit: Payer: Self-pay

## 2019-07-12 ENCOUNTER — Ambulatory Visit (INDEPENDENT_AMBULATORY_CARE_PROVIDER_SITE_OTHER): Payer: Self-pay

## 2019-07-12 DIAGNOSIS — L578 Other skin changes due to chronic exposure to nonionizing radiation: Secondary | ICD-10-CM

## 2019-07-12 DIAGNOSIS — L821 Other seborrheic keratosis: Secondary | ICD-10-CM

## 2019-07-12 DIAGNOSIS — I781 Nevus, non-neoplastic: Secondary | ICD-10-CM

## 2019-07-12 NOTE — Progress Notes (Signed)
Nice response.

## 2019-07-14 ENCOUNTER — Other Ambulatory Visit: Payer: Self-pay | Admitting: Dermatology

## 2019-07-21 ENCOUNTER — Other Ambulatory Visit: Payer: Self-pay

## 2019-07-21 ENCOUNTER — Ambulatory Visit (INDEPENDENT_AMBULATORY_CARE_PROVIDER_SITE_OTHER): Payer: Self-pay | Admitting: Dermatology

## 2019-07-21 DIAGNOSIS — L988 Other specified disorders of the skin and subcutaneous tissue: Secondary | ICD-10-CM

## 2019-07-21 NOTE — Progress Notes (Signed)
   Follow-Up Visit   Subjective  Jessica Haas is a 68 y.o. female who presents for the following: Facial Elastosis.  Follow-up botox and increase dose.  The following portions of the chart were reviewed this encounter and updated as appropriate: Tobacco  Allergies  Meds  Problems  Med Hx  Surg Hx  Fam Hx      Review of Systems: No other skin or systemic complaints.  Objective  Well appearing patient in no apparent distress; mood and affect are within normal limits.  A focused examination was performed including face, lips. Relevant physical exam findings are noted in the Assessment and Plan.  Objective  Mid Forehead, lips and perioral: Rhytides and volume loss.   Images    Assessment & Plan  Elastosis of skin (2) Mid Forehead; lips and perioral  Botox 14 units injected as marked to forehead and frown complex. Lot # [id number]  Exp 12/23  Restylane Defyne 1 syringe injected to lips and corners of mouth. Lot # [id number]  Exp 12/20/2020  Filling material injection - lips and perioral Prior to the procedure, the patient's past medical history, allergies and the rare but potential risks and complications were reviewed with the patient and a signed consent was obtained. Pre and post-treatment care was discussed and instructions provided.  Risks including vascular occlusion were discussed.   Location: see attached photo  Filler Type: Restylane defyne  Procedure: Lidocaine-tetracaine ointment was applied to treatment areas to achieve good local anesthesia. The area was prepped thoroughly with hibiclens After introducing the needle into the desired treatment area, the syringe plunger was drawn back to ensure there was no flash of blood prior to injecting the filler in order to minimize risk of intravascular injection and vascular occlusion. After injection of the filler, the treated areas were cleansed and iced to reduce swelling. Post-treatment instructions were reviewed with  the patient.       Patient tolerated the procedure well. The patient will call with any problems, questions or concerns prior to their next appointment.   Botox Injection - Mid Forehead Location: See attached image  Informed consent: Discussed risks (infection, pain, bleeding, bruising, swelling, allergic reaction, paralysis of nearby muscles, eyelid droop, double vision, neck weakness, difficulty breathing, headache, undesirable cosmetic result, and need for additional treatment) and benefits of the procedure, as well as the alternatives.  Informed consent was obtained.  Preparation: The area was cleansed with alcohol.  Procedure Details:  Botox was injected into the dermis with a 30-gauge needle. Pressure applied to any bleeding. Ice packs offered for swelling.  Lot Number:  see photo Expiration:  see photo  Total Units Injected:  14 units  Plan: Patient was instructed to remain upright for 4 hours. Patient was instructed to avoid massaging the face and avoid vigorous exercise for the rest of the day. Tylenol may be used for headache.  Allow 2 weeks before returning to clinic for additional dosing as needed. Patient will call for any problems.   No follow-ups on file.   I, Donzetta Kohut, CMA, scribed for Alfonso Patten, MD.  Documentation: I have reviewed the above documentation for accuracy and completeness, and I agree with the above.  Forest Gleason, MD

## 2019-07-27 ENCOUNTER — Encounter: Payer: Self-pay | Admitting: Dermatology

## 2019-08-02 ENCOUNTER — Ambulatory Visit: Payer: Self-pay | Admitting: Dermatology

## 2019-08-02 DIAGNOSIS — I781 Nevus, non-neoplastic: Secondary | ICD-10-CM

## 2019-08-02 NOTE — Progress Notes (Addendum)
   Follow-Up Visit   Subjective  Jessica Haas is a 69 y.o. female who presents for the following: Telangiectasias (bil feet, legs).   The following portions of the chart were reviewed this encounter and updated as appropriate:     Review of Systems: No other skin or systemic complaints.  Objective  Well appearing patient in no apparent distress; mood and affect are within normal limits.  A focused examination was performed including bil feet. Relevant physical exam findings are noted in the Assessment and Plan.  Objective  Left Foot - Anterior: Telangiectasias (fine blanching red matted) on the BL feet, L>R  Images            Assessment & Plan  Telangiectasia Left Foot - Anterior  Sclerotherapy - Left Foot - Anterior The patient presents for desired sclerotherapy for desired treatment of desired treatment of small to medium blue non symptomatic varicosities of the L foot.  Procedure: The patient was counseled and understands about the effects, side effects and potential risks and complications of the sclerotherapy procedure. The patient was given the opportunity to ask questions. Asclera (polidocanol) 0.5% (total 2cc) was injected into the varices. In order to ensure correct placement of the catheter in the vein, I drew back slightly to give moderate blood show in the syringe. If there was any evidence or suspicion of extravasation of sclerosant, the area was immediately diluted with a large volume of 0.9% saline. A pressure dressing was applied immediately to the injected sites. The patient tolerated the procedure well without complication. The patient was instructed in post-operative compression stocking use. The patient understands to call or return immediately if any problems noted.   Return in about 2 weeks (around 08/16/2019) for sclero.   I, Othelia Pulling, RMA, am acting as scribe for Brendolyn Patty, MD .

## 2019-08-16 ENCOUNTER — Ambulatory Visit (INDEPENDENT_AMBULATORY_CARE_PROVIDER_SITE_OTHER): Payer: Medicare Other

## 2019-08-16 ENCOUNTER — Other Ambulatory Visit: Payer: Self-pay

## 2019-08-16 ENCOUNTER — Ambulatory Visit: Payer: Medicare Other | Admitting: Dermatology

## 2019-08-16 DIAGNOSIS — L578 Other skin changes due to chronic exposure to nonionizing radiation: Secondary | ICD-10-CM

## 2019-08-16 DIAGNOSIS — L988 Other specified disorders of the skin and subcutaneous tissue: Secondary | ICD-10-CM

## 2019-08-16 DIAGNOSIS — I781 Nevus, non-neoplastic: Secondary | ICD-10-CM

## 2019-08-16 DIAGNOSIS — Z872 Personal history of diseases of the skin and subcutaneous tissue: Secondary | ICD-10-CM

## 2019-09-13 ENCOUNTER — Ambulatory Visit: Payer: Medicare Other | Admitting: Dermatology

## 2019-10-04 ENCOUNTER — Other Ambulatory Visit: Payer: Self-pay | Admitting: Internal Medicine

## 2019-10-25 ENCOUNTER — Other Ambulatory Visit: Payer: Self-pay

## 2019-10-25 ENCOUNTER — Ambulatory Visit (INDEPENDENT_AMBULATORY_CARE_PROVIDER_SITE_OTHER): Payer: Self-pay

## 2019-10-25 DIAGNOSIS — Z872 Personal history of diseases of the skin and subcutaneous tissue: Secondary | ICD-10-CM

## 2019-10-25 DIAGNOSIS — L578 Other skin changes due to chronic exposure to nonionizing radiation: Secondary | ICD-10-CM

## 2019-10-25 DIAGNOSIS — I781 Nevus, non-neoplastic: Secondary | ICD-10-CM

## 2019-11-03 ENCOUNTER — Telehealth: Payer: Self-pay

## 2019-11-03 NOTE — Telephone Encounter (Signed)
Patient left a message asking for a refill of Altreno. Okay to send in?

## 2019-11-03 NOTE — Telephone Encounter (Signed)
Yes, can send in with 2 rfs.

## 2019-11-04 MED ORDER — ALTRENO 0.05 % EX LOTN: 1.0000 "application " | TOPICAL_LOTION | Freq: Every evening | CUTANEOUS | 2 refills | Status: DC

## 2019-11-04 NOTE — Telephone Encounter (Signed)
Prescription sent in to [facility].

## 2019-11-17 ENCOUNTER — Ambulatory Visit: Payer: Medicare Other | Admitting: Dermatology

## 2019-11-24 ENCOUNTER — Other Ambulatory Visit: Payer: Self-pay

## 2019-11-24 ENCOUNTER — Ambulatory Visit (INDEPENDENT_AMBULATORY_CARE_PROVIDER_SITE_OTHER): Payer: Self-pay | Admitting: Dermatology

## 2019-11-24 DIAGNOSIS — L988 Other specified disorders of the skin and subcutaneous tissue: Secondary | ICD-10-CM

## 2019-11-24 NOTE — Patient Instructions (Signed)
Patient was instructed to remain upright for 4 hours. Patient was instructed to avoid massaging the face and avoid vigorous exercise for the rest of the day. Tylenol may be used for headache.  Allow 2 weeks before returning to clinic for additional dosing as needed. Patient will call for any problems.

## 2019-11-24 NOTE — Progress Notes (Signed)
   Follow-Up Visit   Subjective  Jessica Haas is a 69 y.o. female who presents for the following: Botulinum Toxin Injection.  Patient presents today for Botox  The following portions of the chart were reviewed this encounter and updated as appropriate:  Tobacco  Allergies  Meds  Problems  Med Hx  Surg Hx  Fam Hx      Review of Systems:  No other skin or systemic complaints except as noted in HPI or Assessment and Plan.  Objective  Well appearing patient in no apparent distress; mood and affect are within normal limits.  A focused examination was performed including Face. Relevant physical exam findings are noted in the Assessment and Plan.  Objective  Face: Rhytides  Images             Assessment & Plan  Elastosis of skin Face  Consider taking bromelain prior to next botox to reduce bruising  Recommend arnica cream to bruise 4 times daily.  Botox Injection - Face Location: See attached image  Informed consent: Discussed risks (infection, pain, bleeding, bruising, swelling, allergic reaction, paralysis of nearby muscles, eyelid droop, double vision, neck weakness, difficulty breathing, headache, undesirable cosmetic result, and need for additional treatment) and benefits of the procedure, as well as the alternatives.  Informed consent was obtained.  Preparation: The area was cleansed with alcohol.  Procedure Details:  Botox was injected into the dermis with a 30-gauge needle. Pressure applied to any bleeding. Ice packs offered for swelling.  Lot Number:  [id number] Expiration:  02/2022  Total Units Injected:  60  Plan: Patient was instructed to remain upright for 4 hours. Patient was instructed to avoid massaging the face and avoid vigorous exercise for the rest of the day. Tylenol may be used for headache.  Allow 2 weeks before returning to clinic for additional dosing as needed. Patient will call for any problems.   Return in 3 months (on  02/24/2020) for Botox.  IDonzetta Kohut, CMA, am acting as scribe for Forest Gleason, MD .  Documentation: I have reviewed the above documentation for accuracy and completeness, and I agree with the above.  Forest Gleason, MD

## 2019-11-29 IMAGING — MR MR LUMBAR SPINE W/O CM
5 of 6 series · 30 of 48 positions shown · non-contrast
Comparison: Abdominal CT 01/02/2012

CLINICAL DATA: Low back pain with left leg pain and numbness

EXAM:
MRI LUMBAR SPINE WITHOUT CONTRAST
TECHNIQUE: Multiplanar, multisequence MR imaging of the lumbar spine was
performed. No intravenous contrast was administered.

[Series 5: T2 · sagittal · 4.0mm · 0.81mm/px · 6 of 19 slices shown (1 of 2)]
[im 1/19]
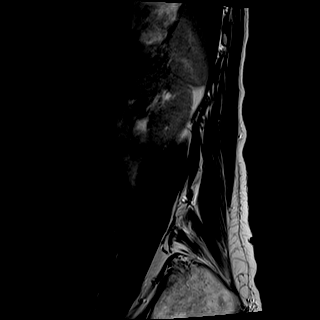
[im 4/19]
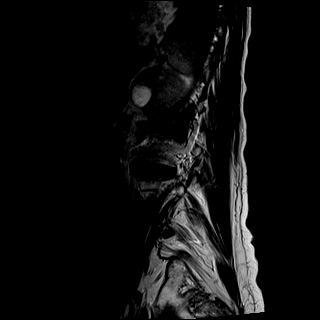
[im 8/19]
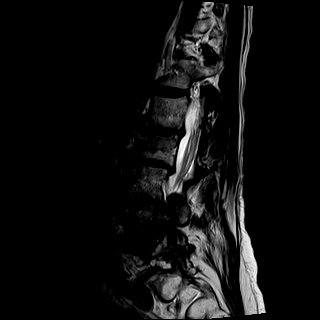
[im 11/19]
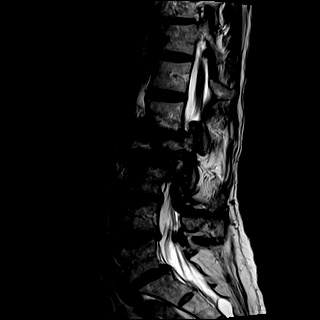
[im 15/19]
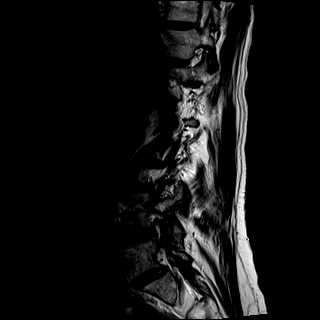
[im 19/19]
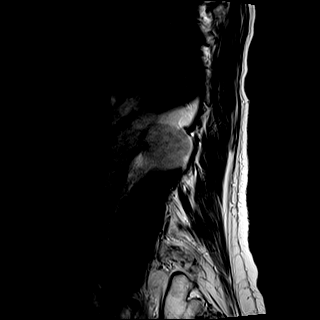

[Series 6: T1 · sagittal · 4.0mm · 0.81mm/px · 6 of 19 slices shown (1 of 2)]
[im 1/19]
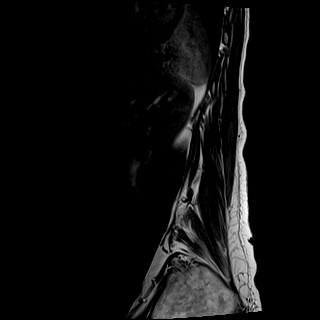
[im 4/19]
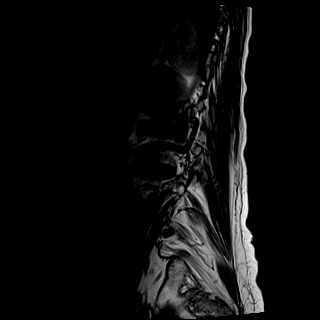
[im 8/19]
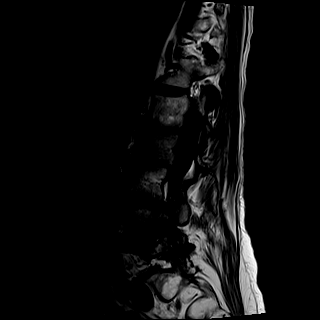
[im 11/19]
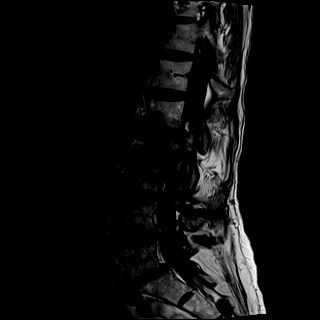
[im 15/19]
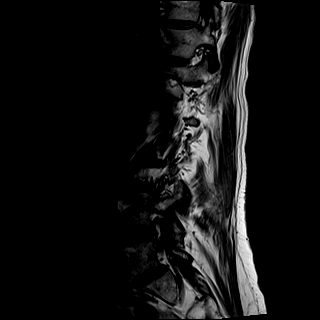
[im 19/19]
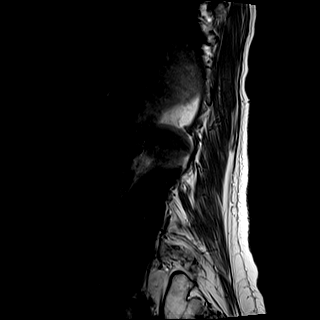

[Series 7: STIR · sagittal · 4.0mm · 0.41mm/px · 2 of 19 slices shown]
[im 1/19]
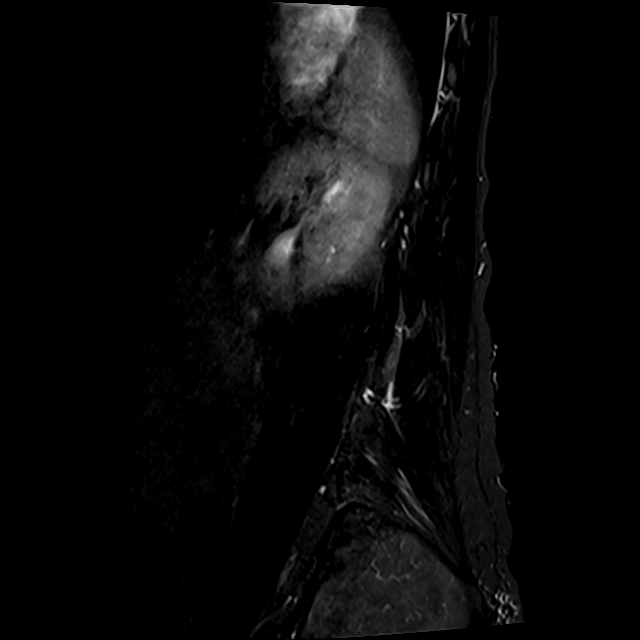
[im 4/19]
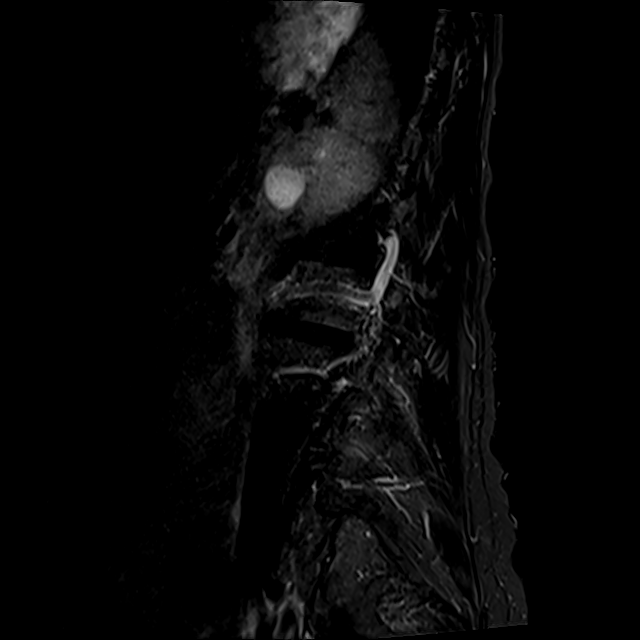

[Series 8: T2 · axial · 4.0mm · 0.78mm/px · z∈[-98,+137]mm · 8 of 43 slices shown (2 of 2)]
[im 1/43]
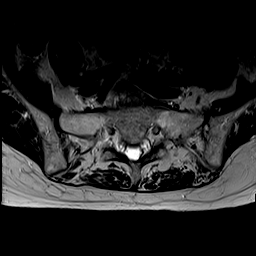
[im 7/43]
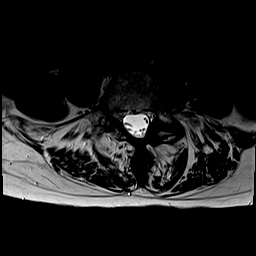
[im 13/43]
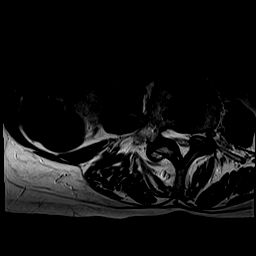
[im 20/43]
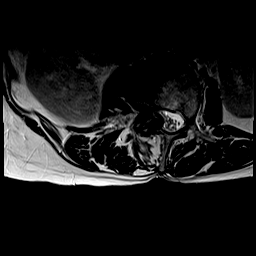
[im 23/43]
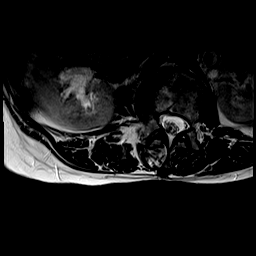
[im 30/43]
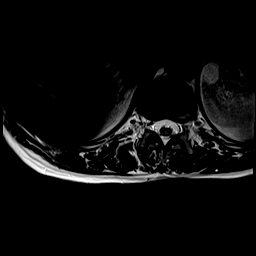
[im 36/43]
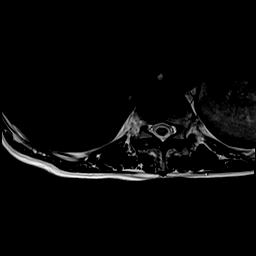
[im 43/43]
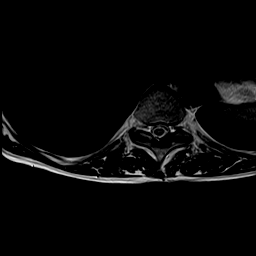

[Series 9: T1 · axial · 4.0mm · 0.39mm/px · z∈[-98,+137]mm · 8 of 43 slices shown (2 of 2)]
[im 1/43]
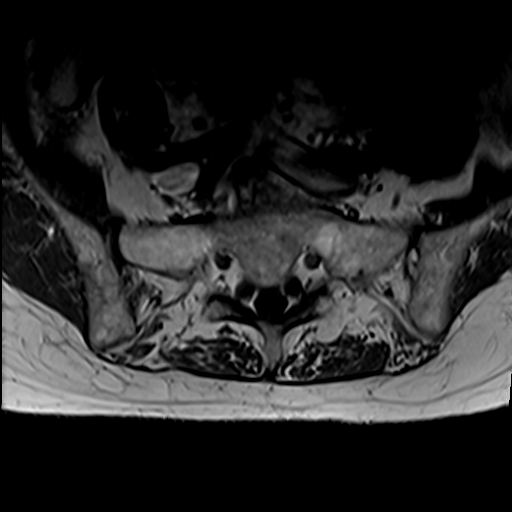
[im 7/43]
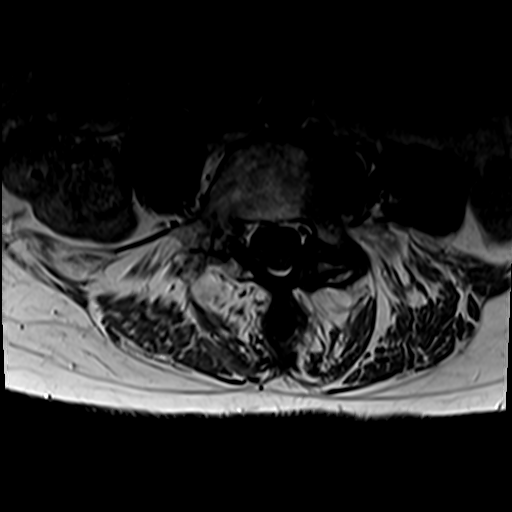
[im 13/43]
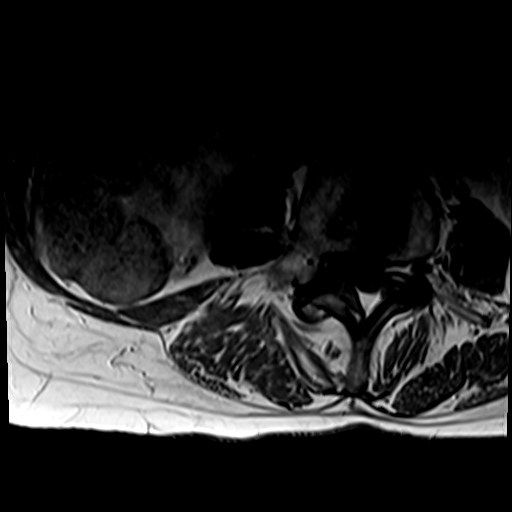
[im 20/43]
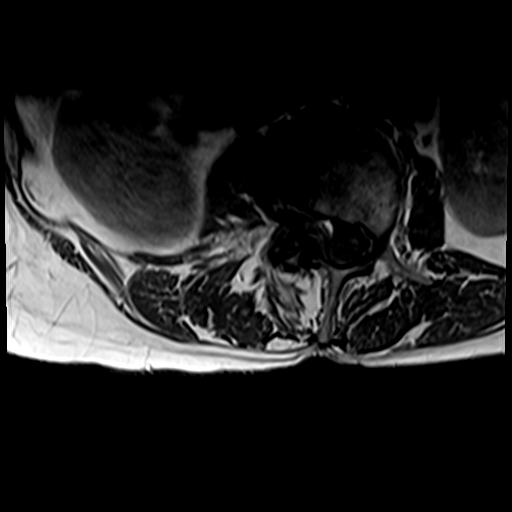
[im 23/43]
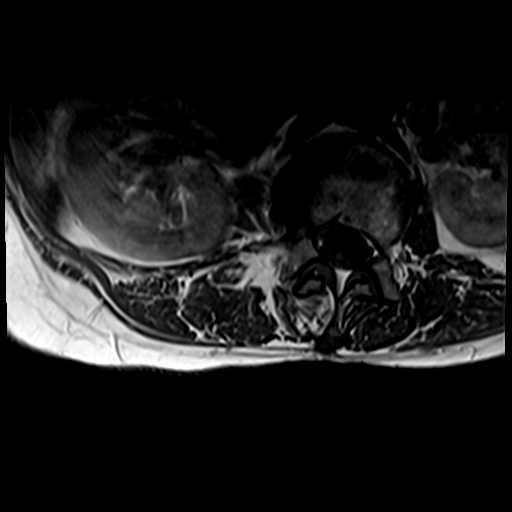
[im 30/43]
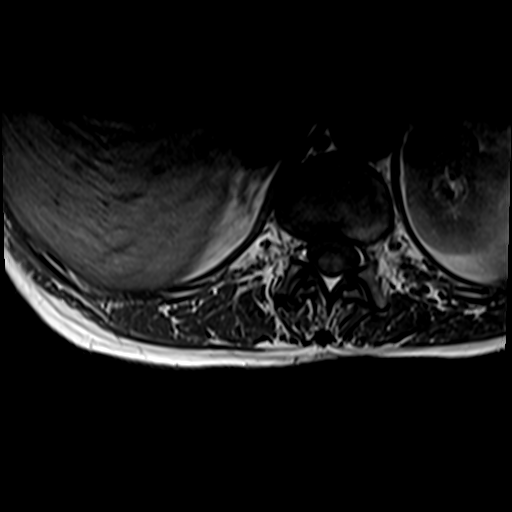
[im 36/43]
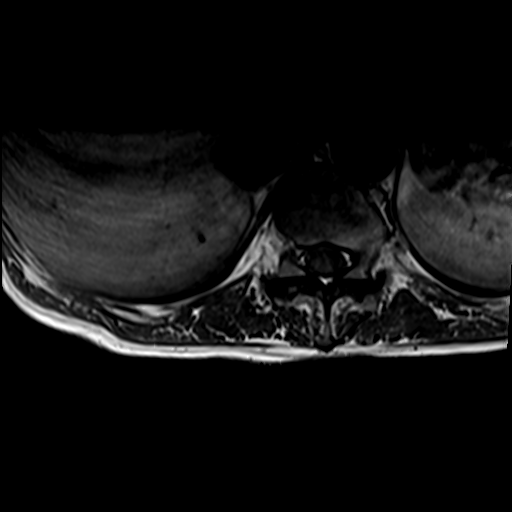
[im 43/43]
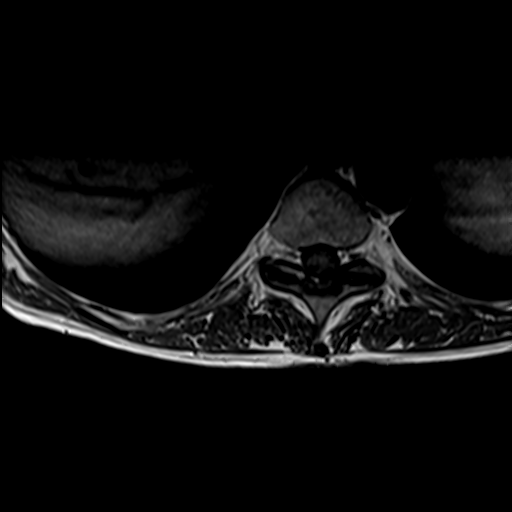

[30 of 48 positions shown; findings below may reference images not displayed]

FINDINGS: Segmentation:  Standard lumbar numbering

Alignment: Significant levoscoliosis. Grade 1 anterolisthesis at
L5-S1. Mild retrolisthesis at L2-3.

Vertebrae:  No fracture, evidence of discitis, or bone lesion.

Conus medullaris and cauda equina: Conus extends to the L1-2 level.
Conus and cauda equina appear normal.

Paraspinal and other soft tissues: Small left renal cystic
intensity.

Disc levels:

T11-12 left facet spurring and moderate foraminal impingement.

T12- L1: Unremarkable.

L1-L2: Advanced disc narrowing with asymmetric right-sided collapse
and preferential disc bulge and endplate spurring. Right facet
spurring. Advanced, compressive right foraminal stenosis. Patent
spinal canal

L2-L3: Advanced disc narrowing with asymmetric right-sided collapse
and preferential disc bulge/endplate spurring. Bulky right facet
spurring. Severe right foraminal impingement. Right subarticular
recess narrowing without static compression

L3-L4: Disc narrowing and bulging with downward pointing disc
protrusion. Degenerative facet spurring greater on the right.
Moderate bilateral foraminal stenosis. Bilateral subarticular recess
narrowing without static compression

L4-L5: Disc narrowing with preferential left-sided collapse. There
is a left foraminal to paracentral protrusion that is superiorly
migrating and compresses the left L4 nerve root. There is severe
compressive stenosis at the left foramen from facet spurring and
disc bulging.

L5-S1:Degenerative facet spurring with anterolisthesis and mild disc
bulging. No impingement
IMPRESSION: 1. Severe facet and disc degeneration with scoliosis and listheses.
2. Severe foraminal impingement on the right at L1-2, right at L2-3,
and left at L4-5.
3. Notable left paracentral protrusion superiorly migrating at L4-5
and compressing the L4 nerve root.
4. T11-12 moderate left foraminal stenosis from facet spur.

## 2019-12-08 ENCOUNTER — Encounter: Payer: Self-pay | Admitting: Dermatology

## 2019-12-14 ENCOUNTER — Other Ambulatory Visit: Payer: Self-pay | Admitting: Dermatology

## 2020-01-03 ENCOUNTER — Other Ambulatory Visit: Payer: Self-pay

## 2020-01-03 ENCOUNTER — Ambulatory Visit (INDEPENDENT_AMBULATORY_CARE_PROVIDER_SITE_OTHER): Payer: Medicare Other

## 2020-01-03 DIAGNOSIS — I781 Nevus, non-neoplastic: Secondary | ICD-10-CM

## 2020-01-03 DIAGNOSIS — Z872 Personal history of diseases of the skin and subcutaneous tissue: Secondary | ICD-10-CM

## 2020-01-04 ENCOUNTER — Ambulatory Visit: Payer: Medicare Other

## 2020-01-12 ENCOUNTER — Other Ambulatory Visit: Payer: Self-pay

## 2020-01-12 ENCOUNTER — Ambulatory Visit (INDEPENDENT_AMBULATORY_CARE_PROVIDER_SITE_OTHER): Payer: Self-pay | Admitting: Dermatology

## 2020-01-12 DIAGNOSIS — L988 Other specified disorders of the skin and subcutaneous tissue: Secondary | ICD-10-CM

## 2020-01-12 NOTE — Progress Notes (Signed)
   Follow-Up Visit   Subjective  Jessica Haas is a 69 y.o. female who presents for the following: Facial Elastosis (of bilateral temples - Fillers today?).  The following portions of the chart were reviewed this encounter and updated as appropriate:  Tobacco  Allergies  Meds  Problems  Med Hx  Surg Hx  Fam Hx     Review of Systems:  No other skin or systemic complaints except as noted in HPI or Assessment and Plan.  Objective  Well appearing patient in no apparent distress; mood and affect are within normal limits.  A focused examination was performed including face. Relevant physical exam findings are noted in the Assessment and Plan.  Objective  Bilateral temples: Rhytides and volume loss.   Images                 Assessment & Plan  Elastosis of skin Bilateral temples  Filling material injection - Bilateral temples Prior to the procedure, the patient's past medical history, allergies and the rare but potential risks and complications were reviewed with the patient and a signed consent was obtained. Pre and post-treatment care was discussed and instructions provided.  Location: temples  Filler TypeMilon Dikes  Lot [id number] Exp 11/21/2020  Procedure: The area was prepped thoroughly with Puracyn. After introducing the needle into the desired treatment area, the syringe plunger was drawn back to ensure there was no flash of blood prior to injecting the filler in order to minimize risk of intravascular injection and vascular occlusion. After injection of the filler, the treated areas were cleansed and iced to reduce swelling. Post-treatment instructions were reviewed with the patient.       Patient tolerated the procedure well. The patient will call with any problems, questions or concerns prior to their next appointment.   Return if symptoms worsen or fail to improve.   I, Ashok Cordia, CMA, am acting as scribe for Sarina Ser, MD  .  Documentation: I have reviewed the above documentation for accuracy and completeness, and I agree with the above.  Sarina Ser, MD

## 2020-01-13 ENCOUNTER — Encounter: Payer: Self-pay | Admitting: Dermatology

## 2020-02-01 ENCOUNTER — Other Ambulatory Visit: Payer: Self-pay | Admitting: Family Medicine

## 2020-02-28 ENCOUNTER — Other Ambulatory Visit: Payer: Self-pay

## 2020-02-28 ENCOUNTER — Ambulatory Visit (INDEPENDENT_AMBULATORY_CARE_PROVIDER_SITE_OTHER): Payer: Self-pay

## 2020-02-28 DIAGNOSIS — L570 Actinic keratosis: Secondary | ICD-10-CM

## 2020-02-28 DIAGNOSIS — L720 Epidermal cyst: Secondary | ICD-10-CM

## 2020-02-28 DIAGNOSIS — Z872 Personal history of diseases of the skin and subcutaneous tissue: Secondary | ICD-10-CM

## 2020-02-28 DIAGNOSIS — I781 Nevus, non-neoplastic: Secondary | ICD-10-CM

## 2020-02-28 DIAGNOSIS — L578 Other skin changes due to chronic exposure to nonionizing radiation: Secondary | ICD-10-CM

## 2020-03-03 ENCOUNTER — Other Ambulatory Visit: Payer: Self-pay | Admitting: Dermatology

## 2020-03-15 ENCOUNTER — Other Ambulatory Visit: Payer: Self-pay | Admitting: Internal Medicine

## 2020-03-29 ENCOUNTER — Telehealth: Payer: Self-pay | Admitting: Dermatology

## 2020-03-29 ENCOUNTER — Ambulatory Visit (INDEPENDENT_AMBULATORY_CARE_PROVIDER_SITE_OTHER): Payer: Self-pay | Admitting: Dermatology

## 2020-03-29 ENCOUNTER — Other Ambulatory Visit: Payer: Self-pay

## 2020-03-29 DIAGNOSIS — L988 Other specified disorders of the skin and subcutaneous tissue: Secondary | ICD-10-CM

## 2020-03-29 NOTE — Progress Notes (Signed)
   Follow-Up Visit   Subjective  Jessica Haas is a 69 y.o. female who presents for the following: Facial Elastosis (pt here for botox injections.).  The following portions of the chart were reviewed this encounter and updated as appropriate:  Tobacco  Allergies  Meds  Problems  Med Hx  Surg Hx  Fam Hx      Review of Systems: No other skin or systemic complaints except as noted in HPI or Assessment and Plan.   Objective  Well appearing patient in no apparent distress; mood and affect are within normal limits.  A focused examination was performed including face. Relevant physical exam findings are noted in the Assessment and Plan.  Objective  face: Rhytides and volume loss.   Images    Assessment & Plan  Elastosis of skin face  Botox today  Plan voluma 1 cc to mid-face with cannula Plan defyne 1-2 cc to nasolabial folds and perioral     Botox Injection - face Location: See attached image  Informed consent: Discussed risks (infection, pain, bleeding, bruising, swelling, allergic reaction, paralysis of nearby muscles, eyelid droop, double vision, neck weakness, difficulty breathing, headache, undesirable cosmetic result, and need for additional treatment) and benefits of the procedure, as well as the alternatives.  Informed consent was obtained.  Preparation: The area was cleansed with alcohol.  Procedure Details:  Botox was injected into the dermis with a 30-gauge needle. Pressure applied to any bleeding. Ice packs offered for swelling.  Lot Number:  [id number] Expiration:  06/2022  Total Units Injected:  60  Plan: Patient was instructed to remain upright for 4 hours. Patient was instructed to avoid massaging the face and avoid vigorous exercise for the rest of the day. Tylenol may be used for headache.  Allow 2 weeks before returning to clinic for additional dosing as needed. Patient will call for any problems.   No follow-ups on file.   I, Harriett Sine, CMA, am acting as scribe for Forest Gleason, MD.  Documentation: I have reviewed the above documentation for accuracy and completeness, and I agree with the above.  Forest Gleason, MD

## 2020-03-29 NOTE — Telephone Encounter (Signed)
Small open wound s/p cryotherapy to left preauricular. Start vaseline several times per day to keep moist and will check at follow-up.

## 2020-03-30 ENCOUNTER — Encounter: Payer: Self-pay | Admitting: Dermatology

## 2020-04-03 ENCOUNTER — Other Ambulatory Visit: Payer: Self-pay

## 2020-04-03 ENCOUNTER — Ambulatory Visit (INDEPENDENT_AMBULATORY_CARE_PROVIDER_SITE_OTHER): Payer: Medicare Other

## 2020-04-03 DIAGNOSIS — L578 Other skin changes due to chronic exposure to nonionizing radiation: Secondary | ICD-10-CM

## 2020-04-03 DIAGNOSIS — I781 Nevus, non-neoplastic: Secondary | ICD-10-CM

## 2020-04-03 NOTE — Progress Notes (Signed)
BBL neck and chest. See BBL log sheet for settings. jj

## 2020-05-05 ENCOUNTER — Other Ambulatory Visit: Payer: Self-pay | Admitting: Dermatology

## 2020-05-09 ENCOUNTER — Ambulatory Visit (INDEPENDENT_AMBULATORY_CARE_PROVIDER_SITE_OTHER): Payer: Self-pay | Admitting: Dermatology

## 2020-05-09 ENCOUNTER — Other Ambulatory Visit: Payer: Self-pay

## 2020-05-09 DIAGNOSIS — L988 Other specified disorders of the skin and subcutaneous tissue: Secondary | ICD-10-CM

## 2020-05-09 MED ORDER — ALTRENO 0.05 % EX LOTN: TOPICAL_LOTION | CUTANEOUS | 2 refills | Status: DC

## 2020-05-09 NOTE — Patient Instructions (Addendum)
Topical retinoid medications like tretinoin/Retin-A, adapalene/Differin, tazarotene/Fabior, and Epiduo/Epiduo Forte can cause dryness and irritation when first started. Only apply a pea-sized amount to the entire affected area. Avoid applying it around the eyes, edges of mouth and creases at the nose. If you experience irritation, use a good moisturizer first and/or apply the medicine less often. If you are doing well with the medicine, you can increase how often you use it until you are applying every night. Be careful with sun protection while using this medication as it can make you sensitive to the sun. This medicine should not be used by pregnant women.   Recommend The Ordinary Argan oil.

## 2020-05-09 NOTE — Progress Notes (Unsigned)
Follow-Up Visit   Subjective  Jessica Haas is a 70 y.o. female who presents for the following: Facial Elastosis (Patient here today for fillers at mid face and around jaw. ).  The following portions of the chart were reviewed this encounter and updated as appropriate:   Tobacco  Allergies  Meds  Problems  Med Hx  Surg Hx  Fam Hx      Review of Systems:  No other skin or systemic complaints except as noted in HPI or Assessment and Plan.  Objective  Well appearing patient in no apparent distress; mood and affect are within normal limits.  A focused examination was performed including face. Relevant physical exam findings are noted in the Assessment and Plan.  Head - Anterior (Face)  Images                        Assessment & Plan  Elastosis of skin Head - Anterior (Face)  Start Altreno nightly as tolerated.   Topical retinoid medications like tretinoin/Retin-A, adapalene/Differin, tazarotene/Fabior, and Epiduo/Epiduo Forte can cause dryness and irritation when first started. Only apply a pea-sized amount to the entire affected area. Avoid applying it around the eyes, edges of mouth and creases at the nose. If you experience irritation, use a good moisturizer first and/or apply the medicine less often. If you are doing well with the medicine, you can increase how often you use it until you are applying every night. Be careful with sun protection while using this medication as it can make you sensitive to the sun. This medicine should not be used by pregnant women.    Filling material injection - Head - Anterior (Face) Prior to the procedure, the patient's past medical history, allergies and the rare but potential risks and complications were reviewed with the patient and a signed consent was obtained. Pre and post-treatment care was discussed and instructions provided.  Location: medial cheeks, nasolabial folds, chin, jaw  Filler Type: Juvederm  Voluma 2cc to mid face with cannula, Restylane Defyne 1 cc to nasolabial and perioral, Juvederm Voluma 1 cc injected to chin and jaw  Procedure: Lidocaine-tetracaine ointment was applied to treatment areas to achieve good local anesthesia. The area was prepped thoroughly with hibiclens The area was prepped thoroughly with Puracyn. After introducing the needle into the desired treatment area, the syringe plunger was drawn back to ensure there was no flash of blood prior to injecting the filler in order to minimize risk of intravascular injection and vascular occlusion. A cannula was used to inject the filler. Insertion sites were prepped with puracyn and lidocaine was injected to achieve good local anesthesia. A 23-gauge needle was used to create an opening at the insertion site and then the cannula was inserted using sterile technique. Prior to injecting filler at the desired location, the syringe plunger was drawn back to ensure there was no flash of blood in order to minimize risk of intravascular injection and vascular occlusion.  After injection of the filler, the treated areas were cleansed. Post-treatment instructions were reviewed with the patient.       Patient tolerated the procedure well. The patient will call with any problems, questions or concerns prior to their next appointment.   Ordered Medications: Tretinoin (ALTRENO) 0.05 % LOTN  Return for as scheduled.  Graciella Belton, RMA, am acting as scribe for Forest Gleason, MD .  Documentation: I have reviewed the above documentation for accuracy and completeness, and I agree with  the above.  Forest Gleason, MD

## 2020-05-10 ENCOUNTER — Encounter: Payer: Self-pay | Admitting: Dermatology

## 2020-05-11 ENCOUNTER — Other Ambulatory Visit: Payer: Self-pay

## 2020-05-11 ENCOUNTER — Telehealth: Payer: Self-pay

## 2020-05-11 DIAGNOSIS — L988 Other specified disorders of the skin and subcutaneous tissue: Secondary | ICD-10-CM

## 2020-05-11 MED ORDER — ALTRENO 0.05 % EX LOTN: 1.0000 "application " | TOPICAL_LOTION | Freq: Every evening | CUTANEOUS | 2 refills | Status: DC

## 2020-05-11 NOTE — Telephone Encounter (Signed)
Spoke with patient and she is ok with [place] sending rx to [facility]. Patient has had it sent there in the past, JS

## 2020-05-11 NOTE — Telephone Encounter (Signed)
Fax from pharmacy states that they are unable to get Altreno lotion. They are asking to switch to tretinoin cream. Ok to switch?

## 2020-05-11 NOTE — Telephone Encounter (Signed)
Would not recommend switch due to tolerability. Are they not able to get it or is it not covered by insurance? If unable to get it, please ask patient what she would like to do? Generic tretinoin is cheaper but more irritating/drying and I would switch to the lower strength 0.025% cream. There is also a multi-ingredient anti-aging cream (tretinoin, hyaluronic acid, resveratrol, vitamin C) I could prescribe for her from a mail order pharmacy ([facility]) for around $70 but it will not last nearly as long compared to the Altreno which can last 6 months to a year. Thank you!

## 2020-05-24 ENCOUNTER — Other Ambulatory Visit: Payer: Self-pay | Admitting: Family Medicine

## 2020-06-27 ENCOUNTER — Other Ambulatory Visit: Payer: Self-pay

## 2020-06-27 ENCOUNTER — Ambulatory Visit: Payer: Medicare Other | Admitting: Dermatology

## 2020-06-27 ENCOUNTER — Encounter: Payer: Self-pay | Admitting: Dermatology

## 2020-06-27 DIAGNOSIS — L821 Other seborrheic keratosis: Secondary | ICD-10-CM

## 2020-06-27 DIAGNOSIS — Z1283 Encounter for screening for malignant neoplasm of skin: Secondary | ICD-10-CM

## 2020-06-27 DIAGNOSIS — L578 Other skin changes due to chronic exposure to nonionizing radiation: Secondary | ICD-10-CM

## 2020-06-27 DIAGNOSIS — Z85828 Personal history of other malignant neoplasm of skin: Secondary | ICD-10-CM

## 2020-06-27 DIAGNOSIS — L570 Actinic keratosis: Secondary | ICD-10-CM

## 2020-06-27 DIAGNOSIS — L814 Other melanin hyperpigmentation: Secondary | ICD-10-CM

## 2020-06-27 DIAGNOSIS — L820 Inflamed seborrheic keratosis: Secondary | ICD-10-CM

## 2020-06-27 NOTE — Progress Notes (Signed)
Follow-Up Visit   Subjective  Jessica Haas is a 70 y.o. female who presents for the following: Annual Exam (Patient here for TBSE. She has a history of SCCs, BCC, and Aks. She has several irritated, bothersome spots she would like treated today.).   The following portions of the chart were reviewed this encounter and updated as appropriate:       Review of Systems:  No other skin or systemic complaints except as noted in HPI or Assessment and Plan.  Objective  Well appearing patient in no apparent distress; mood and affect are within normal limits.  A full examination was performed including scalp, head, eyes, ears, nose, lips, neck, chest, axillae, abdomen, back, buttocks, bilateral upper extremities, bilateral lower extremities, hands, feet, fingers, toes, fingernails, and toenails. All findings within normal limits unless otherwise noted below.  Objective  Left Upper Back x 1, L mid upper back mid scar x 1, R mid back x 1, L forearm x 1 (4): Erythematous thin papules/macules with gritty scale. Indistinct pink scaly macules mid forehead  Objective  R flank x 1, R upper abdomen x 2, R pretibia x 2, L upper pretibia x 2, L lower calf x 5, R calf x 2, L ant thigh x 2 (16): Erythematous keratotic or waxy stuck-on papule or plaque.    Assessment & Plan   Skin cancer screening performed today.  Actinic Damage - chronic, secondary to cumulative UV radiation exposure/sun exposure over time - diffuse scaly erythematous macules with underlying dyspigmentation - Recommend daily broad spectrum sunscreen SPF 30+ to sun-exposed areas, reapply every 2 hours as needed.  - Recommend staying in the shade or wearing long sleeves, sun glasses (UVA+UVB protection) and wide brim hats (4-inch brim around the entire circumference of the hat). - Call for new or changing lesions. - Recheck mid forehead after patient has mini halo treatment with Sonia Baller.  Seborrheic Keratoses - Stuck-on, waxy,  tan-brown papules and plaques  - Discussed benign etiology and prognosis. - Observe - Call for any changes - Recommend AmLactin Moisturizer  History of Squamous Cell Carcinoma of the Skin - No evidence of recurrence today of the right calf x 2. - Recommend regular full body skin exams - Recommend daily broad spectrum sunscreen SPF 30+ to sun-exposed areas, reapply every 2 hours as needed.  - Call if any new or changing lesions are noted between office visits  History of Basal Cell Carcinoma of the Skin - No evidence of recurrence today of the back - Recommend regular full body skin exams - Recommend daily broad spectrum sunscreen SPF 30+ to sun-exposed areas, reapply every 2 hours as needed.  - Call if any new or changing lesions are noted between office visits  AK (actinic keratosis) (4) Left Upper Back x 1, L mid upper back mid scar x 1, R mid back x 1, L forearm x 1  Recheck L mid back mid scar. If still present on follow-up, will biopsy. She is going to have [place] (mini Halo) procedure on face in few weeks with Sonia Baller in [place].  Will recheck forehead on f/up.  Destruction of lesion - Left Upper Back x 1, L mid upper back mid scar x 1, R mid back x 1, L forearm x 1  Destruction method: cryotherapy   Informed consent: discussed and consent obtained   Lesion destroyed using liquid nitrogen: Yes   Region frozen until ice ball extended beyond lesion: Yes   Outcome: patient tolerated procedure  well with no complications   Post-procedure details: wound care instructions given    Inflamed seborrheic keratosis (16) R flank x 1, R upper abdomen x 2, R pretibia x 2, L upper pretibia x 2, L lower calf x 5, R calf x 2, L ant thigh x 2  Destruction of lesion - R flank x 1, R upper abdomen x 2, R pretibia x 2, L upper pretibia x 2, L lower calf x 5, R calf x 2, L ant thigh x 2  Destruction method: cryotherapy   Informed consent: discussed and consent obtained   Lesion destroyed using  liquid nitrogen: Yes   Region frozen until ice ball extended beyond lesion: Yes   Outcome: patient tolerated procedure well with no complications   Post-procedure details: wound care instructions given     Lentigines - Scattered tan macules - Due to sun exposure - Benign-appering, observe - Recommend daily broad spectrum sunscreen SPF 30+ to sun-exposed areas, reapply every 2 hours as needed. - Call for any changes   Return in about 6 months (around 12/28/2020) for Recheck mid forehead, back mid scar.   IJamesetta Orleans, CMA, am acting as scribe for Brendolyn Patty, MD .  Documentation: I have reviewed the above documentation for accuracy and completeness, and I agree with the above.  Brendolyn Patty MD

## 2020-06-27 NOTE — Patient Instructions (Signed)
Cryotherapy Aftercare  . Wash gently with soap and water everyday.   . Apply Vaseline and Band-Aid daily until healed.  

## 2020-06-28 ENCOUNTER — Other Ambulatory Visit: Payer: Self-pay | Admitting: Family Medicine

## 2020-07-20 ENCOUNTER — Other Ambulatory Visit: Payer: Self-pay

## 2020-07-20 ENCOUNTER — Ambulatory Visit (INDEPENDENT_AMBULATORY_CARE_PROVIDER_SITE_OTHER): Payer: Self-pay | Admitting: Dermatology

## 2020-07-20 ENCOUNTER — Encounter: Payer: Self-pay | Admitting: Dermatology

## 2020-07-20 DIAGNOSIS — L988 Other specified disorders of the skin and subcutaneous tissue: Secondary | ICD-10-CM

## 2020-07-20 NOTE — Progress Notes (Signed)
   Follow-Up Visit   Subjective  Jessica Haas is a 70 y.o. female who presents for the following: Facial Elastosis (Patient here today for botox injections).  The following portions of the chart were reviewed this encounter and updated as appropriate:   Tobacco  Allergies  Meds  Problems  Med Hx  Surg Hx  Fam Hx      Review of Systems:  No other skin or systemic complaints except as noted in HPI or Assessment and Plan.  Objective  Well appearing patient in no apparent distress; mood and affect are within normal limits.  A focused examination was performed including face. Relevant physical exam findings are noted in the Assessment and Plan.  Objective  Head - Anterior (Face): Rhytides and volume loss.   Images     Assessment & Plan  Elastosis of skin Head - Anterior (Face)  Added 2 additional units at glabella compared to last treatment  Botox Injection - Head - Anterior (Face) Location: See attached image  Informed consent: Discussed risks (infection, pain, bleeding, bruising, swelling, allergic reaction, paralysis of nearby muscles, eyelid droop, double vision, neck weakness, difficulty breathing, headache, undesirable cosmetic result, and need for additional treatment) and benefits of the procedure, as well as the alternatives.  Informed consent was obtained.  Preparation: The area was cleansed with alcohol.  Procedure Details:  Botox was injected into the dermis with a 30-gauge needle. Pressure applied to any bleeding. Ice packs offered for swelling.  Lot Number:  [id number] Expiration:  08/2022  Total Units Injected:  62  Plan: Patient was instructed to remain upright for 4 hours. Patient was instructed to avoid massaging the face and avoid vigorous exercise for the rest of the day. Tylenol may be used for headache.  Allow 2 weeks before returning to clinic for additional dosing as needed. Patient will call for any problems.   Other Related  Medications Tretinoin (ALTRENO) 0.05 % LOTN ALTRENO 0.05 % LOTN  No follow-ups on file.  Graciella Belton, RMA, am acting as scribe for Forest Gleason, MD .  Documentation: I have reviewed the above documentation for accuracy and completeness, and I agree with the above.  Forest Gleason, MD

## 2020-07-20 NOTE — Patient Instructions (Signed)

## 2020-08-21 ENCOUNTER — Other Ambulatory Visit: Payer: Self-pay | Admitting: Dermatology

## 2020-08-24 ENCOUNTER — Other Ambulatory Visit: Payer: Self-pay | Admitting: Family Medicine

## 2020-10-18 ENCOUNTER — Ambulatory Visit: Payer: Medicare Other | Admitting: Dermatology

## 2020-11-14 ENCOUNTER — Other Ambulatory Visit: Payer: Self-pay | Admitting: Family Medicine

## 2020-11-15 ENCOUNTER — Ambulatory Visit (INDEPENDENT_AMBULATORY_CARE_PROVIDER_SITE_OTHER): Payer: Medicare Other | Admitting: Dermatology

## 2020-11-15 ENCOUNTER — Other Ambulatory Visit: Payer: Self-pay

## 2020-11-15 DIAGNOSIS — S80861A Insect bite (nonvenomous), right lower leg, initial encounter: Secondary | ICD-10-CM | POA: Diagnosis not present

## 2020-11-15 DIAGNOSIS — L570 Actinic keratosis: Secondary | ICD-10-CM

## 2020-11-15 DIAGNOSIS — W57XXXA Bitten or stung by nonvenomous insect and other nonvenomous arthropods, initial encounter: Secondary | ICD-10-CM | POA: Diagnosis not present

## 2020-11-15 DIAGNOSIS — L988 Other specified disorders of the skin and subcutaneous tissue: Secondary | ICD-10-CM

## 2020-11-15 MED ORDER — CLOBETASOL PROPIONATE 0.05 % EX OINT: TOPICAL_OINTMENT | CUTANEOUS | 1 refills | Status: DC

## 2020-11-15 NOTE — Progress Notes (Signed)
Follow-Up Visit   Subjective  Jessica Haas is a 70 y.o. female who presents for the following: Facial Elastosis (Patient here today for 4 month botox. She reports she would like to get same dosage as last  time. She also would like to discuss getting more samples halog and impoyz. She states they were given to here for itchy spots on skin for eczema. ).  The following portions of the chart were reviewed this encounter and updated as appropriate:  Tobacco  Allergies  Meds  Problems  Med Hx  Surg Hx  Fam Hx       Objective  Well appearing patient in no apparent distress; mood and affect are within normal limits.  A focused examination was performed including face, bilateral extremities . Relevant physical exam findings are noted in the Assessment and Plan.  Right Lower Leg - Anterior Clear today  Head - Anterior (Face) Rhytides and volume loss.      Head - Anterior (Face) Erythematous thin papules/macules with gritty scale.   Assessment & Plan  Bug bite without infection, initial encounter Right Lower Leg - Anterior  Patient and husband with extremely itchy red dots that pop up and go away. By history consistent with bug bites.  Recommend DEET or picaridin insect repellent consistently when outdoors.  Monitor to see if getting bites indoors.  Start clobetasol ointment twice a day as needed up to 2 weeks. Avoid applying to face, groin, and axilla. Use as directed. Risk of skin atrophy with long-term use reviewed.   Topical steroids (such as triamcinolone, fluocinolone, fluocinonide, mometasone, clobetasol, halobetasol, betamethasone, hydrocortisone) can cause thinning and lightening of the skin if they are used for too long in the same area. Your physician has selected the right strength medicine for your problem and area affected on the body. Please use your medication only as directed by your physician to prevent side effects.   clobetasol ointment (TEMOVATE)  0.05 % - Right Lower Leg - Anterior Start clobetasol ointment twice a day as needed up to 2 weeks. Avoid applying to face, groin, and axilla. Use as directed. Risk of skin atrophy with long-term use reviewed.  Elastosis of skin Head - Anterior (Face)  Botox Injection - Head - Anterior (Face) Location: Head see image   Informed consent: Discussed risks (infection, pain, bleeding, bruising, swelling, allergic reaction, paralysis of nearby muscles, eyelid droop, double vision, neck weakness, difficulty breathing, headache, undesirable cosmetic result, and need for additional treatment) and benefits of the procedure, as well as the alternatives.  Informed consent was obtained.  Preparation: The area was cleansed with alcohol.  Procedure Details:  Botox was injected into the dermis with a 30-gauge needle. Pressure applied to any bleeding. Ice packs offered for swelling.  Lot Number:  [id number] Expiration:  06/2022  Total Units Injected: 62 units   Plan: Patient was instructed to remain upright for 4 hours. Patient was instructed to avoid massaging the face and avoid vigorous exercise for the rest of the day. Tylenol may be used for headache.  Allow 2 weeks before returning to clinic for additional dosing as needed. Patient will call for any problems.   Related Medications Tretinoin (ALTRENO) 0.05 % LOTN Apply to face nightly as directed.  ALTRENO 0.05 % LOTN Apply 1 application topically at bedtime.  AK (actinic keratosis) Head - Anterior (Face)  Discussed field treatment with patient.    Return for 3 - 4 months botox . Garry Heater, CMA, am acting as  scribe for Forest Gleason, MD.  Documentation: I have reviewed the above documentation for accuracy and completeness, and I agree with the above.  Forest Gleason, MD

## 2020-11-15 NOTE — Patient Instructions (Addendum)
Recommend DEET or picaridin insect repellent consistently when outdoors.   Actinic keratoses are precancerous spots that appear secondary to cumulative UV radiation exposure/sun exposure over time. They are chronic with expected duration over 1 year. A portion of actinic keratoses will progress to squamous cell carcinoma of the skin. It is not possible to reliably predict which spots will progress to skin cancer and so treatment is recommended to prevent development of skin cancer.  Recommend daily broad spectrum sunscreen SPF 30+ to sun-exposed areas, reapply every 2 hours as needed.  Recommend staying in the shade or wearing long sleeves, sun glasses (UVA+UVB protection) and wide brim hats (4-inch brim around the entire circumference of the hat). Call for new or changing lesions.   Topical steroids (such as triamcinolone, fluocinolone, fluocinonide, mometasone, clobetasol, halobetasol, betamethasone, hydrocortisone) can cause thinning and lightening of the skin if they are used for too long in the same area. Your physician has selected the right strength medicine for your problem and area affected on the body. Please use your medication only as directed by your physician to prevent side effects.    If you have any questions or concerns for your doctor, please call our main line at [phone] and press option 4 to reach your doctor's medical assistant. If no one answers, please leave a voicemail as directed and we will return your call as soon as possible. Messages left after 4 pm will be answered the following business day.   You may also send [place] a message via [place]. We typically respond to MyChart messages within 1-2 business days.  For prescription refills, please ask your pharmacy to contact our office. Our fax number is [phone].  If you have an urgent issue when the clinic is closed that cannot wait until the next business day, you can page your doctor at the number below.     Please note that while we do our best to be available for urgent issues outside of office hours, we are not available 24/7.   If you have an urgent issue and are unable to reach [place], you may choose to seek medical care at your doctor's office, retail clinic, urgent care center, or emergency room.  If you have a medical emergency, please immediately call 911 or go to the emergency department.  Pager Numbers  - Dr. Nehemiah Massed: [phone]  - Dr. Laurence Ferrari: [phone]  - Dr. Nicole Kindred: [phone]  In the event of inclement weather, please call our main line at [phone] for an update on the status of any delays or closures.  Dermatology Medication Tips: Please keep the boxes that topical medications come in in order to help keep track of the instructions about where and how to use these. Pharmacies typically print the medication instructions only on the boxes and not directly on the medication tubes.   If your medication is too expensive, please contact our office at [phone] option 4 or send [place] a message through [place].   We are unable to tell what your co-pay for medications will be in advance as this is different depending on your insurance coverage. However, we may be able to find a substitute medication at lower cost or fill out paperwork to get insurance to cover a needed medication.   If a prior authorization is required to get your medication covered by your insurance company, please allow [place] 1-2 business days to complete this process.  Drug prices often vary depending on where the prescription is filled and some pharmacies may  offer cheaper prices.  The website www.goodrx.com contains coupons for medications through different pharmacies. The prices here do not account for what the cost may be with help from insurance (it may be cheaper with your insurance), but the website can give you the price if you did not use any insurance.  - You can print the associated coupon and take  it with your prescription to the pharmacy.  - You may also stop by our office during regular business hours and pick up a GoodRx coupon card.  - If you need your prescription sent electronically to a different pharmacy, notify our office through [facility] or by phone at [phone] option 4.

## 2020-11-15 NOTE — Telephone Encounter (Signed)
This is a Dr Derrel Nip patient and it appears she may have transferred care to a different office. I would suggest sending it to Dr Derrel Nip to review.

## 2020-11-16 NOTE — Telephone Encounter (Signed)
Called and spoke with Jessica Haas's husband. He states that Jessica Haas is seeing another doctor, but Jessica Haas had requested before to stay under Dr. Lupita Dawn care as well as her other doctor to have 2 PCPs. Pt has not been seen since 2020 by Dr. Derrel Nip. Dr. Caryl Bis is on the patients care chart. Please advise.

## 2020-11-26 ENCOUNTER — Encounter: Payer: Self-pay | Admitting: Dermatology

## 2021-01-09 ENCOUNTER — Other Ambulatory Visit: Payer: Self-pay

## 2021-01-09 ENCOUNTER — Ambulatory Visit: Payer: Medicare Other | Admitting: Dermatology

## 2021-01-09 DIAGNOSIS — L570 Actinic keratosis: Secondary | ICD-10-CM

## 2021-01-09 DIAGNOSIS — I8310 Varicose veins of unspecified lower extremity with inflammation: Secondary | ICD-10-CM

## 2021-01-09 DIAGNOSIS — L0100 Impetigo, unspecified: Secondary | ICD-10-CM

## 2021-01-09 DIAGNOSIS — L988 Other specified disorders of the skin and subcutaneous tissue: Secondary | ICD-10-CM | POA: Diagnosis not present

## 2021-01-09 DIAGNOSIS — L719 Rosacea, unspecified: Secondary | ICD-10-CM | POA: Diagnosis not present

## 2021-01-09 DIAGNOSIS — D692 Other nonthrombocytopenic purpura: Secondary | ICD-10-CM

## 2021-01-09 DIAGNOSIS — L820 Inflamed seborrheic keratosis: Secondary | ICD-10-CM | POA: Diagnosis not present

## 2021-01-09 DIAGNOSIS — L578 Other skin changes due to chronic exposure to nonionizing radiation: Secondary | ICD-10-CM

## 2021-01-09 DIAGNOSIS — L821 Other seborrheic keratosis: Secondary | ICD-10-CM

## 2021-01-09 MED ORDER — ALTRENO 0.05 % EX LOTN: 1.0000 "application " | TOPICAL_LOTION | Freq: Every evening | CUTANEOUS | 2 refills | Status: DC

## 2021-01-09 MED ORDER — IVERMECTIN 1 % EX CREA: TOPICAL_CREAM | CUTANEOUS | 1 refills | Status: DC

## 2021-01-09 MED ORDER — MUPIROCIN 2 % EX OINT: 1.0000 "application " | TOPICAL_OINTMENT | Freq: Two times a day (BID) | CUTANEOUS | 1 refills | Status: DC

## 2021-01-09 NOTE — Progress Notes (Signed)
Follow-Up Visit   Subjective  Jessica Haas is a 70 y.o. female who presents for the following: Follow-up (Patient here today for 6 month follow up and recheck spot at forehead and back. Patient does have a spot at right base of neck that is irritated. She also has a spot at right ear that may be an AK. ).  It was frozen in past but came back.  She also has some itchy spots on her leg and chest.  Patient has an appt in 2 weeks with Lind Covert for BBL.   The following portions of the chart were reviewed this encounter and updated as appropriate:       Review of Systems:  No other skin or systemic complaints except as noted in HPI or Assessment and Plan.  Objective  Well appearing patient in no apparent distress; mood and affect are within normal limits.  A focused examination was performed including legs, arms, face, back, shoulders, chest. Relevant physical exam findings are noted in the Assessment and Plan.  face Rhytides and volume loss.   face Mild erythema malar cheeks, resolving inflammatory papule at right cheek  Right lower ear helix above earlobe x 2, left spinal upper back x 1, right posterior medial shoulder x 2, forehead x 7, right upper lip x 1, right paranasal x 1 (14) Erythematous thin papules/macules with gritty scale.   right upper clavicle x 1, left lower sternum x 1, right medial lower leg x 6 (8) Erythematous keratotic or waxy stuck-on papule  left nostril History of crusty scab, clear today   Assessment & Plan  Elastosis of skin face  Continue Altreno 0.05% lotion nightly as tolerated  Topical retinoid medications like tretinoin/Retin-A, adapalene/Differin, tazarotene/Fabior, and Epiduo/Epiduo Forte can cause dryness and irritation when first started. Only apply a pea-sized amount to the entire affected area. Avoid applying it around the eyes, edges of mouth and creases at the nose. If you experience irritation, use a good moisturizer first and/or  apply the medicine less often. If you are doing well with the medicine, you can increase how often you use it until you are applying every night. Be careful with sun protection while using this medication as it can make you sensitive to the sun. This medicine should not be used by pregnant women.    ALTRENO 0.05 % LOTN - face Apply 1 application topically at bedtime.  Rosacea face  Controlled on treatment Rosacea is a chronic progressive skin condition usually affecting the face of adults, causing redness and/or acne bumps. It is treatable but not curable. It sometimes affects the eyes (ocular rosacea) as well. It may respond to topical and/or systemic medication and can flare with stress, sun exposure, alcohol, exercise and some foods.  Daily application of broad spectrum spf 30+ sunscreen to face is recommended to reduce flares.  Continue Soolantra cream QHS  Ivermectin (SOOLANTRA) 1 % CREA - face Apply nightly  AK (actinic keratosis) (14) Right lower ear helix above earlobe x 2, left spinal upper back x 1, right posterior medial shoulder x 2, forehead x 7, right upper lip x 1, right paranasal x 1  Hypertrophic at left spinal upper back  Destruction of lesion - Right lower ear helix above earlobe x 2, left spinal upper back x 1, right posterior medial shoulder x 2, forehead x 7, right upper lip x 1, right paranasal x 1  Destruction method: cryotherapy   Informed consent: discussed and consent obtained   Lesion  destroyed using liquid nitrogen: Yes   Region frozen until ice ball extended beyond lesion: Yes   Outcome: patient tolerated procedure well with no complications   Post-procedure details: wound care instructions given   Additional details:  Prior to procedure, discussed risks of blister formation, small wound, skin dyspigmentation, or rare scar following cryotherapy. Recommend Vaseline ointment to treated areas while healing.   Inflamed seborrheic keratosis right upper  clavicle x 1, left lower sternum x 1, right medial lower leg x 6  Destruction of lesion - right upper clavicle x 1, left lower sternum x 1, right medial lower leg x 6  Destruction method: cryotherapy   Informed consent: discussed and consent obtained   Lesion destroyed using liquid nitrogen: Yes   Region frozen until ice ball extended beyond lesion: Yes   Outcome: patient tolerated procedure well with no complications   Post-procedure details: wound care instructions given   Additional details:  Prior to procedure, discussed risks of blister formation, small wound, skin dyspigmentation, or rare scar following cryotherapy. Recommend Vaseline ointment to treated areas while healing.   Impetigo left nostril  Start mupirocin twice daily to nostrils for 1 week, and prn recurrence  mupirocin ointment (BACTROBAN) 2 % - left nostril Apply 1 application topically 2 (two) times daily.  Purpura - Chronic; persistent and recurrent.  Treatable, but not curable. - Violaceous macules and patches - Benign - Related to trauma, age, sun damage and/or use of blood thinners, chronic use of topical and/or oral steroids - Observe - Can use OTC arnica containing moisturizer such as Dermend Bruise Formula if desired - Call for worsening or other concerns  Varicose Veins/Spider Veins - Dilated blue, purple or red veins at the lower extremities - Reassured - Smaller vessels can be treated by sclerotherapy (a procedure to inject a medicine into the veins to make them disappear) if desired, but the treatment is not covered by insurance. Larger vessels may be covered if symptomatic and we would refer to vascular surgeon if treatment desired.  Seborrheic Keratoses - Stuck-on, waxy, tan-brown papules and/or plaques  - Benign-appearing - Discussed benign etiology and prognosis. - Observe - Call for any changes  Actinic Damage - Severe, confluent actinic changes with pre-cancerous actinic keratoses  -  Severe, chronic, not at goal, secondary to cumulative UV radiation exposure over time - diffuse scaly erythematous macules and papules with underlying dyspigmentation - Discussed Prescription "Field Treatment" for Severe, Chronic Confluent Actinic Changes with Pre-Cancerous Actinic Keratoses at face [facility] treatment involves treatment of an entire area of skin that has confluent Actinic Changes (Sun/ Ultraviolet light damage) and PreCancerous Actinic Keratoses by method of PhotoDynamic Therapy (PDT) and/or prescription Topical Chemotherapy agents such as 5-fluorouracil, 5-fluorouracil/calcipotriene, and/or imiquimod.  The purpose is to decrease the number of clinically evident and subclinical PreCancerous lesions to prevent progression to development of skin cancer by chemically destroying early precancer changes that may or may not be visible.  It has been shown to reduce the risk of developing skin cancer in the treated area. As a result of treatment, redness, scaling, crusting, and open sores may occur during treatment course. One or more than one of these methods may be used and may have to be used several times to control, suppress and eliminate the PreCancerous changes. Discussed treatment course, expected reaction, and possible side effects. - Recommend daily broad spectrum sunscreen SPF 30+ to sun-exposed areas, reapply every 2 hours as needed.  - Staying in the shade or wearing long sleeves,  sun glasses (UVA+UVB protection) and wide brim hats (4-inch brim around the entire circumference of the hat) are also recommended. - Call for new or changing lesions. - She will schedule for PDT in Jan for face  Return in about 4 months (around 05/11/2021) for PDT to face, 6 month AK follow up.  Graciella Belton, RMA, am acting as scribe for Brendolyn Patty, MD .  Documentation: I have reviewed the above documentation for accuracy and completeness, and I agree with the above.  Brendolyn Patty MD

## 2021-01-09 NOTE — Patient Instructions (Addendum)
Cryotherapy Aftercare  Wash gently with soap and water everyday.   Apply Vaseline and Band-Aid daily until healed.   Recommend daily broad spectrum sunscreen SPF 30+ to sun-exposed areas, reapply every 2 hours as needed. Call for new or changing lesions.  Staying in the shade or wearing long sleeves, sun glasses (UVA+UVB protection) and wide brim hats (4-inch brim around the entire circumference of the hat) are also recommended for sun protection.   If you have any questions or concerns for your doctor, please call our main line at [phone] and press option 4 to reach your doctor's medical assistant. If no one answers, please leave a voicemail as directed and we will return your call as soon as possible. Messages left after 4 pm will be answered the following business day.   You may also send [place] a message via [place]. We typically respond to MyChart messages within 1-2 business days.  For prescription refills, please ask your pharmacy to contact our office. Our fax number is [phone].  If you have an urgent issue when the clinic is closed that cannot wait until the next business day, you can page your doctor at the number below.    Please note that while we do our best to be available for urgent issues outside of office hours, we are not available 24/7.   If you have an urgent issue and are unable to reach [place], you may choose to seek medical care at your doctor's office, retail clinic, urgent care center, or emergency room.  If you have a medical emergency, please immediately call 911 or go to the emergency department.  Pager Numbers  - Dr. Nehemiah Massed: [phone]  - Dr. Laurence Ferrari: [phone]  - Dr. Nicole Kindred: [phone]  In the event of inclement weather, please call our main line at [phone] for an update on the status of any delays or closures.  Dermatology Medication Tips: Please keep the boxes that topical medications come in in order to help keep track of the  instructions about where and how to use these. Pharmacies typically print the medication instructions only on the boxes and not directly on the medication tubes.   If your medication is too expensive, please contact our office at [phone] option 4 or send [place] a message through [place].   We are unable to tell what your co-pay for medications will be in advance as this is different depending on your insurance coverage. However, we may be able to find a substitute medication at lower cost or fill out paperwork to get insurance to cover a needed medication.   If a prior authorization is required to get your medication covered by your insurance company, please allow [place] 1-2 business days to complete this process.  Drug prices often vary depending on where the prescription is filled and some pharmacies may offer cheaper prices.  The website www.goodrx.com contains coupons for medications through different pharmacies. The prices here do not account for what the cost may be with help from insurance (it may be cheaper with your insurance), but the website can give you the price if you did not use any insurance.  - You can print the associated coupon and take it with your prescription to the pharmacy.  - You may also stop by our office during regular business hours and pick up a GoodRx coupon card.  - If you need your prescription sent electronically to a different pharmacy, notify our office through [facility] or by phone at [phone] option  4.  

## 2021-02-03 ENCOUNTER — Other Ambulatory Visit: Payer: Self-pay | Admitting: Internal Medicine

## 2021-02-05 NOTE — Telephone Encounter (Signed)
Please advise patient number has changed, patient has two PCP's? Has not been seen in this office since 2020, please advise to refilling Tizanidine?

## 2021-02-14 ENCOUNTER — Other Ambulatory Visit: Payer: Self-pay | Admitting: Internal Medicine

## 2021-02-22 ENCOUNTER — Other Ambulatory Visit: Payer: Self-pay

## 2021-02-22 ENCOUNTER — Encounter: Payer: Self-pay | Admitting: Dermatology

## 2021-02-22 ENCOUNTER — Ambulatory Visit (INDEPENDENT_AMBULATORY_CARE_PROVIDER_SITE_OTHER): Payer: Self-pay | Admitting: Dermatology

## 2021-02-22 DIAGNOSIS — L988 Other specified disorders of the skin and subcutaneous tissue: Secondary | ICD-10-CM

## 2021-02-22 NOTE — Progress Notes (Signed)
   Follow-Up Visit   Subjective  Jessica Haas is a 70 y.o. female who presents for the following: Facial Elastosis (Patient is here today for Botox injections. She would also like to discuss filler treatments and adjustments to the tear trough area and forehead. ).  The following portions of the chart were reviewed this encounter and updated as appropriate:   Tobacco  Allergies  Meds  Problems  Med Hx  Surg Hx  Fam Hx      Review of Systems:  No other skin or systemic complaints except as noted in HPI or Assessment and Plan.  Objective  Well appearing patient in no apparent distress; mood and affect are within normal limits.  A focused examination was performed including the face. Relevant physical exam findings are noted in the Assessment and Plan.  Face Rhytides and volume loss.       Assessment & Plan  Elastosis of skin Face  Continue Altreno lotion QHS. Topical retinoid medications like tretinoin/Retin-A, adapalene/Differin, tazarotene/Fabior, and Epiduo/Epiduo Forte can cause dryness and irritation when first started. Only apply a pea-sized amount to the entire affected area. Avoid applying it around the eyes, edges of mouth and creases at the nose. If you experience irritation, use a good moisturizer first and/or apply the medicine less often. If you are doing well with the medicine, you can increase how often you use it until you are applying every night. Be careful with sun protection while using this medication as it can make you sensitive to the sun. This medicine should not be used by pregnant women.   Discussed fillers to the tear trough areas and lips (perioral wrinkling and oral commissures too).   Discussed Botox to help with wrinkling around the mouth and added 4 units to the upper lip and 2 units to the lower lip.   Botox Injection - Face Location: See attached image  Informed consent: Discussed risks (infection, pain, bleeding, bruising, swelling,  allergic reaction, paralysis of nearby muscles, eyelid droop, double vision, neck weakness, difficulty breathing, headache, undesirable cosmetic result, and need for additional treatment) and benefits of the procedure, as well as the alternatives.  Informed consent was obtained.  Preparation: The area was cleansed with alcohol.  Procedure Details:  Botox was injected into the dermis with a 30-gauge needle. Pressure applied to any bleeding. Ice packs offered for swelling.  Lot Number:  [id number] Expiration:  11/2022  Total Units Injected:  68  Plan: Tylenol may be used for headache.  Allow 2 weeks before returning to clinic for additional dosing as needed. Patient will call for any problems.   Related Medications ALTRENO 0.05 % LOTN Apply 1 application topically at bedtime.  Return in about 3 months (around 05/25/2021) for Botox injections.  Luther Redo, CMA, am acting as scribe for Forest Gleason, MD .  Documentation: I have reviewed the above documentation for accuracy and completeness, and I agree with the above.  Forest Gleason, MD

## 2021-02-22 NOTE — Patient Instructions (Signed)

## 2021-03-12 ENCOUNTER — Other Ambulatory Visit: Payer: Self-pay | Admitting: Dermatology

## 2021-03-12 DIAGNOSIS — L719 Rosacea, unspecified: Secondary | ICD-10-CM

## 2021-04-03 ENCOUNTER — Other Ambulatory Visit: Payer: Self-pay | Admitting: Internal Medicine

## 2021-04-03 ENCOUNTER — Other Ambulatory Visit: Payer: Self-pay

## 2021-04-03 ENCOUNTER — Encounter: Payer: Self-pay | Admitting: Dermatology

## 2021-04-03 ENCOUNTER — Ambulatory Visit: Payer: Medicare Other | Admitting: Dermatology

## 2021-04-03 DIAGNOSIS — C44729 Squamous cell carcinoma of skin of left lower limb, including hip: Secondary | ICD-10-CM | POA: Diagnosis not present

## 2021-04-03 DIAGNOSIS — D485 Neoplasm of uncertain behavior of skin: Secondary | ICD-10-CM

## 2021-04-03 NOTE — Patient Instructions (Addendum)

## 2021-04-03 NOTE — Progress Notes (Signed)
° °  Follow-Up Visit   Subjective  Jessica Haas is a 70 y.o. female who presents for the following: Skin Problem (Patient here today for spot at lower left leg that she thinks became infected after shaving. Present for about 10 days, currently using mupirocin and covering with band-aid. It is painful for patient. ).  The following portions of the chart were reviewed this encounter and updated as appropriate:   Tobacco   Allergies   Meds   Problems   Med Hx   Surg Hx   Fam Hx       Review of Systems:  No other skin or systemic complaints except as noted in HPI or Assessment and Plan.  Objective  Well appearing patient in no apparent distress; mood and affect are within normal limits.  A focused examination was performed including left lower leg. Relevant physical exam findings are noted in the Assessment and Plan.  Left medial calf 0.8 cm tender pink papule  R/o SCC      Assessment & Plan  Neoplasm of uncertain behavior of skin Left medial calf  Epidermal / dermal shaving  Lesion diameter (cm):  0.8 Informed consent: discussed and consent obtained   Timeout: patient name, date of birth, surgical site, and procedure verified   Patient was prepped and draped in usual sterile fashion: area prepped with isopropyl alcohol. Anesthesia: the lesion was anesthetized in a standard fashion   Anesthetic:  1% lidocaine w/ epinephrine 1-100,000 buffered w/ 8.4% NaHCO3 Instrument used: flexible razor blade   Hemostasis achieved with: aluminum chloride   Outcome: patient tolerated procedure well   Post-procedure details: wound care instructions given   Additional details:  Mupirocin and a bandage applied  Destruction of lesion  Destruction method: electrodesiccation and curettage   Informed consent: discussed and consent obtained   Timeout:  patient name, date of birth, surgical site, and procedure verified Patient was prepped and draped in usual sterile fashion: area prepped with  isopropyl alcohol. Anesthesia: the lesion was anesthetized in a standard fashion   Anesthetic:  1% lidocaine w/ epinephrine 1-100,000 buffered w/ 8.4% NaHCO3 (0.25% bupivicaine) Curettage performed in three different directions: Yes   Electrodesiccation performed over the curetted area: Yes   Curettage cycles:  3 Final wound size (cm):  1.4 Hemostasis achieved with:  electrodesiccation Outcome: patient tolerated procedure well with no complications   Post-procedure details: wound care instructions given   Additional details:  Mupirocin and a pressure dressing applied  Specimen 1 - Surgical pathology Differential Diagnosis: R/o SCC  Check Margins: No 0.8 cm tender pink papule    Exam highly suspicious for [facility] type SCC Discussed with patient She strongly prefers ED&C today at time of biopsy given this has been tender and growing and her plans to travel over Christmas. Discussed this may not be SCC and this could leave her with a larger wound to heal unnecessarily. She still favors ED&C.  Return for as scheduled.  Graciella Belton, RMA, am acting as scribe for Forest Gleason, MD .  Documentation: I have reviewed the above documentation for accuracy and completeness, and I agree with the above.  Forest Gleason, MD

## 2021-04-10 ENCOUNTER — Other Ambulatory Visit: Payer: Self-pay | Admitting: Dermatology

## 2021-04-10 ENCOUNTER — Telehealth: Payer: Self-pay

## 2021-04-10 DIAGNOSIS — W57XXXA Bitten or stung by nonvenomous insect and other nonvenomous arthropods, initial encounter: Secondary | ICD-10-CM

## 2021-04-10 NOTE — Telephone Encounter (Signed)
Ok to refill, thanks

## 2021-04-10 NOTE — Telephone Encounter (Signed)
Spoke with this patient she uses Clobetasol ointment for small red itchy spots on her arms and legs, pt stats she has eczema spots.

## 2021-04-19 ENCOUNTER — Encounter: Payer: Self-pay | Admitting: Dermatology

## 2021-04-19 ENCOUNTER — Other Ambulatory Visit: Payer: Self-pay

## 2021-04-19 ENCOUNTER — Ambulatory Visit: Payer: Medicare Other | Admitting: Dermatology

## 2021-04-19 DIAGNOSIS — L570 Actinic keratosis: Secondary | ICD-10-CM | POA: Diagnosis not present

## 2021-04-19 DIAGNOSIS — L988 Other specified disorders of the skin and subcutaneous tissue: Secondary | ICD-10-CM

## 2021-04-19 DIAGNOSIS — S81802A Unspecified open wound, left lower leg, initial encounter: Secondary | ICD-10-CM

## 2021-04-19 DIAGNOSIS — Z85828 Personal history of other malignant neoplasm of skin: Secondary | ICD-10-CM | POA: Diagnosis not present

## 2021-04-19 NOTE — Progress Notes (Signed)
Follow-Up Visit   Subjective  Jessica Haas is a 70 y.o. female who presents for the following: Facial Elastosis (Patient is here today for fillers.). She also has a lesion on her right hand that is irregular and irritated that she would like treated today, and the SCC site on her L med calf recheck to ensure that it is healing properly.   The following portions of the chart were reviewed this encounter and updated as appropriate:   Tobacco   Allergies   Meds   Problems   Med Hx   Surg Hx   Fam Hx       Review of Systems:  No other skin or systemic complaints except as noted in HPI or Assessment and Plan.  Objective  Well appearing patient in no apparent distress; mood and affect are within normal limits.  A focused examination was performed including the face, hands, and L leg. Relevant physical exam findings are noted in the Assessment and Plan.  Face Rhytides and volume loss.                                          L med calf Healing ulceration.   R dorsal hand x 1 Erythematous thin papules/macules with gritty scale.      Assessment & Plan  Elastosis of skin Face  Discussed tightening laser treatment of lower face - sofwave laser treatment at [facility] with Dr. Joslyn Devon Cox vs Genius radiofrequency at [facility]. Will do a little more research regarding which may be more effective and let patient know.  Restylane Refyne 1 mL injected as marked: - B/L tear trough   Restylane Defyne 7mL injected as marked:  - B/L temple - B/L nasolabial fold - B/L marionette  Filling material injection - Face Prior to the procedure, the patient's past medical history, allergies and the rare but potential risks and complications were reviewed with the patient and a signed consent was obtained. Pre and post-treatment care was discussed and instructions provided.  Location: [name] Type:  Restylane refyne  Lot number: [id number]  Expiration date: 06/19/2022  Procedure: The area was prepped thoroughly with Puracyn. After introducing the needle into the desired treatment area, the syringe plunger was drawn back to ensure there was no flash of blood prior to injecting the filler in order to minimize risk of intravascular injection and vascular occlusion. A cannula was used to inject the filler. Insertion sites were prepped with hibiclens and Bupivacaine was injected to achieve good local anesthesia. A 23-gauge needle was used to create an opening at the insertion site and then the cannula was inserted using sterile technique. Prior to injecting filler at the desired location, the syringe plunger was drawn back to ensure there was no flash of blood in order to minimize risk of intravascular injection and vascular occlusion.     Patient tolerated the procedure well. The patient will call with any problems, questions or concerns prior to their next appointment.   Filling material injection - Face Prior to the procedure, the patient's past medical history, allergies and the rare but potential risks and complications were reviewed with the patient and a signed consent was obtained. Pre and post-treatment care was discussed and instructions provided.  Location: nasolabial folds, oral commissures, and temples  Filler Type: Restylane defyne x 2 syringes  Lot number: [id number]  Expiration date: 05/22/2022  Procedure: The area was prepped thoroughly with Puracyn. After introducing the needle into the desired treatment area, the syringe plunger was drawn back to ensure there was no flash of blood prior to injecting the filler in order to minimize risk of intravascular injection and vascular occlusion. After injection of the filler, the treated areas were cleansed and iced to reduce swelling. Post-treatment instructions were reviewed with the patient.       Patient tolerated the procedure well. The  patient will call with any problems, questions or concerns prior to their next appointment.   Related Medications ALTRENO 0.05 % LOTN Apply 1 application topically at bedtime.  History of SCC (squamous cell carcinoma) of skin L med calf  Continue wound care daily.   AK (actinic keratosis) R dorsal hand x 1  Destruction of lesion - R dorsal hand x 1 Complexity: simple   Destruction method: cryotherapy   Informed consent: discussed and consent obtained   Timeout:  patient name, date of birth, surgical site, and procedure verified Lesion destroyed using liquid nitrogen: Yes   Region frozen until ice ball extended beyond lesion: Yes   Outcome: patient tolerated procedure well with no complications   Post-procedure details: wound care instructions given     Open wound at leg - s/p ED&C - healing well - can switch from mupirocin plus bandage daily to blister bandaid changing q3 days  Return for appointment as scheduled.  Luther Redo, CMA, am acting as scribe for Forest Gleason, MD .  Documentation: I have reviewed the above documentation for accuracy and completeness, and I agree with the above.  Forest Gleason, MD

## 2021-04-19 NOTE — Patient Instructions (Signed)

## 2021-04-24 ENCOUNTER — Encounter: Payer: Self-pay | Admitting: Dermatology

## 2021-05-14 ENCOUNTER — Ambulatory Visit (INDEPENDENT_AMBULATORY_CARE_PROVIDER_SITE_OTHER): Payer: Medicare Other

## 2021-05-14 ENCOUNTER — Other Ambulatory Visit: Payer: Self-pay

## 2021-05-14 DIAGNOSIS — L570 Actinic keratosis: Secondary | ICD-10-CM

## 2021-05-14 MED ORDER — AMINOLEVULINIC ACID HCL 20 % EX SOLR
1.0000 "application " | Freq: Once | CUTANEOUS | Status: AC
  Administered 2021-05-14: 354 mg via TOPICAL

## 2021-05-14 NOTE — Patient Instructions (Signed)

## 2021-05-14 NOTE — Progress Notes (Signed)
Patient completed PDT therapy today.  1. AK (actinic keratosis) Head - Anterior (Face)  Photodynamic therapy - Head - Anterior (Face) Procedure discussed: discussed risks, benefits, side effects. and alternatives   Prep: site scrubbed/prepped with acetone   Location:  Face Number of lesions:  Multiple Type of treatment:  Blue light Aminolevulinic Acid (see MAR for details): Levulan Number of Levulan sticks used:  1 Incubation time (minutes):  60 Number of minutes under lamp:  16 Number of seconds under lamp:  40 Cooling:  Floor fan Outcome: patient tolerated procedure well with no complications   Post-procedure details: sunscreen applied    Patient given samples of Solbar Sunscreen and Vanicream Moisturizer and Face Wash.

## 2021-05-31 ENCOUNTER — Encounter: Payer: Self-pay | Admitting: Dermatology

## 2021-05-31 ENCOUNTER — Telehealth: Payer: Self-pay

## 2021-05-31 ENCOUNTER — Other Ambulatory Visit: Payer: Self-pay

## 2021-05-31 ENCOUNTER — Ambulatory Visit (INDEPENDENT_AMBULATORY_CARE_PROVIDER_SITE_OTHER): Payer: Self-pay | Admitting: Dermatology

## 2021-05-31 DIAGNOSIS — B009 Herpesviral infection, unspecified: Secondary | ICD-10-CM

## 2021-05-31 DIAGNOSIS — L988 Other specified disorders of the skin and subcutaneous tissue: Secondary | ICD-10-CM

## 2021-05-31 MED ORDER — VALACYCLOVIR HCL 500 MG PO TABS: 500.0000 mg | ORAL_TABLET | Freq: Two times a day (BID) | ORAL | 0 refills | Status: DC

## 2021-05-31 NOTE — Patient Instructions (Addendum)
Start Valacyclovir 500mg  twice daily 3 days prior to PDT and 10 days after.   If You Need Anything After Your Visit  If you have any questions or concerns for your doctor, please call our main line at [phone] and press option 4 to reach your doctor's medical assistant. If no one answers, please leave a voicemail as directed and we will return your call as soon as possible. Messages left after 4 pm will be answered the following business day.   You may also send [place] a message via [place]. We typically respond to MyChart messages within 1-2 business days.  For prescription refills, please ask your pharmacy to contact our office. Our fax number is [phone].  If you have an urgent issue when the clinic is closed that cannot wait until the next business day, you can page your doctor at the number below.    Please note that while we do our best to be available for urgent issues outside of office hours, we are not available 24/7.   If you have an urgent issue and are unable to reach [place], you may choose to seek medical care at your doctor's office, retail clinic, urgent care center, or emergency room.  If you have a medical emergency, please immediately call 911 or go to the emergency department.  Pager Numbers  - Dr. Nehemiah Massed: [phone]  - Dr. Laurence Ferrari: [phone]  - Dr. Nicole Kindred: [phone]  In the event of inclement weather, please call our main line at [phone] for an update on the status of any delays or closures.  Dermatology Medication Tips: Please keep the boxes that topical medications come in in order to help keep track of the instructions about where and how to use these. Pharmacies typically print the medication instructions only on the boxes and not directly on the medication tubes.   If your medication is too expensive, please contact our office at [phone] option 4 or send [place] a message through [place].   We are unable to tell what your co-pay for medications  will be in advance as this is different depending on your insurance coverage. However, we may be able to find a substitute medication at lower cost or fill out paperwork to get insurance to cover a needed medication.   If a prior authorization is required to get your medication covered by your insurance company, please allow [place] 1-2 business days to complete this process.  Drug prices often vary depending on where the prescription is filled and some pharmacies may offer cheaper prices.  The website www.goodrx.com contains coupons for medications through different pharmacies. The prices here do not account for what the cost may be with help from insurance (it may be cheaper with your insurance), but the website can give you the price if you did not use any insurance.  - You can print the associated coupon and take it with your prescription to the pharmacy.  - You may also stop by our office during regular business hours and pick up a GoodRx coupon card.  - If you need your prescription sent electronically to a different pharmacy, notify our office through [facility] or by phone at [phone] option 4.     Si Usted Necesita Algo Despus de Su Visita  Tambin puede enviarnos un mensaje a travs de Pharmacist, community. Por lo general respondemos a los mensajes de MyChart en el transcurso de 1 a 2 das hbiles.  Para renovar recetas, por favor pida a su farmacia que se  ponga en contacto con nuestra oficina. Harland Dingwall de fax es [place] [phone].  Si tiene un asunto urgente cuando la clnica est cerrada y que no puede esperar hasta el siguiente da hbil, puede llamar/localizar a su doctor(a) al nmero que aparece a continuacin.   Por favor, tenga en cuenta que aunque hacemos todo lo posible para estar disponibles para asuntos urgentes fuera del horario de [place], no estamos disponibles las 24 horas del da, los 7 das de la [place].   Si tiene un problema urgente y no puede comunicarse con  nosotros, puede optar por buscar atencin mdica  en el consultorio de su doctor(a), en una clnica privada, en un centro de atencin urgente o en una sala de emergencias.  Si tiene Engineering geologist, por favor llame inmediatamente al 911 o vaya a la sala de emergencias.  Nmeros de bper  - Dr. Nehemiah Massed: [phone]  - Dra. Moye: [phone]  - Dra. Nicole Kindred: [phone]  En caso de inclemencias del [place], por favor llame a Johnsie Kindred principal al [phone] para una actualizacin sobre el [place] de cualquier retraso o cierre.  Consejos para la medicacin en dermatologa: Por favor, guarde las cajas en las que vienen los medicamentos de uso tpico para ayudarle a seguir las instrucciones sobre dnde y cmo usarlos. Las farmacias generalmente imprimen las instrucciones del medicamento slo en las cajas y no directamente en los tubos del [place].   Si su medicamento es muy caro, por favor, pngase en contacto con Zigmund Daniel llamando al [phone] y presione la opcin 4 o envenos un mensaje a travs de Pharmacist, community.   No podemos decirle cul ser su copago por los medicamentos por adelantado ya que esto es diferente dependiendo de la cobertura de su seguro. Sin embargo, es posible que podamos encontrar un medicamento sustituto a Electrical engineer un formulario para que el seguro cubra el medicamento que se considera necesario.   Si se requiere una autorizacin previa para que su compaa de seguros [place] su medicamento, por favor permtanos de 1 a 2 das hbiles para completar este proceso.  Los precios de los medicamentos varan con frecuencia dependiendo del Environmental consultant de dnde se surte la receta y alguna farmacias pueden ofrecer precios ms baratos.  El sitio web www.goodrx.com tiene cupones para medicamentos de Airline pilot. Los precios aqu no tienen en cuenta lo que podra costar con la ayuda del seguro (puede ser ms barato con su seguro), pero el sitio web  puede darle el precio si no utiliz Research scientist (physical sciences).  - Puede imprimir el cupn correspondiente y llevarlo con su receta a la farmacia.  - Tambin puede pasar por nuestra oficina durante el horario de atencin regular y Charity fundraiser una tarjeta de cupones de GoodRx.  - Si necesita que su receta se enve electrnicamente a una farmacia diferente, informe a nuestra oficina a travs de MyChart de [place] o por telfono llamando al [phone] y presione la opcin 4.

## 2021-05-31 NOTE — Telephone Encounter (Signed)
Patient concerned with precancerous changes at perioral/chin/upper cutaneous lip. Will start Valacyclovir prior to next PDT treatment so these areas will be treated as well. Rx sent today. JP.

## 2021-05-31 NOTE — Telephone Encounter (Signed)
Got it, thank you!!

## 2021-05-31 NOTE — Progress Notes (Signed)
° °  Follow-Up Visit   Subjective  Jessica Haas is a 71 y.o. female who presents for the following: Facial Elastosis (Here for Botox).  The following portions of the chart were reviewed this encounter and updated as appropriate:  Tobacco   Allergies   Meds   Problems   Med Hx   Surg Hx   Fam Hx       Review of Systems: No other skin or systemic complaints except as noted in HPI or Assessment and Plan.   Objective  Well appearing patient in no apparent distress; mood and affect are within normal limits.  A focused examination was performed including face. Relevant physical exam findings are noted in the Assessment and Plan.  face Rhytides and volume loss.    Assessment & Plan  Elastosis of skin face  Botox 68 units injected as noted on diagram.  Botox Injection - face Location: See attached image  Informed consent: Discussed risks (infection, pain, bleeding, bruising, swelling, allergic reaction, paralysis of nearby muscles, eyelid droop, double vision, neck weakness, difficulty breathing, headache, undesirable cosmetic result, and need for additional treatment) and benefits of the procedure, as well as the alternatives.  Informed consent was obtained.  Preparation: The area was cleansed with alcohol.  Procedure Details:  Botox was injected into the dermis with a 30-gauge needle. Pressure applied to any bleeding. Ice packs offered for swelling.  Lot Number:  [id number] Expiration:  06/2023  Total Units Injected:  68  Plan: Tylenol may be used for headache.  Allow 2 weeks before returning to clinic for additional dosing as needed. Patient will call for any problems.   valACYclovir (VALTREX) 500 MG tablet - face Take 1 tablet (500 mg total) by mouth 2 (two) times daily for 13 days. Begin 3 days prior to PDT procedure  Related Medications ALTRENO 0.05 % LOTN Apply 1 application topically at bedtime.    Return for Botox 3-4 months, PDT as scheduled.  I, Emelia Salisbury,  CMA, am acting as scribe for Forest Gleason, MD.  Documentation: I have reviewed the above documentation for accuracy and completeness, and I agree with the above.  Forest Gleason, MD

## 2021-06-07 ENCOUNTER — Other Ambulatory Visit: Payer: Self-pay | Admitting: Dermatology

## 2021-06-09 ENCOUNTER — Encounter: Payer: Self-pay | Admitting: Dermatology

## 2021-06-12 ENCOUNTER — Ambulatory Visit: Payer: Medicare Other | Admitting: Dermatology

## 2021-06-12 ENCOUNTER — Other Ambulatory Visit: Payer: Self-pay

## 2021-06-12 DIAGNOSIS — L570 Actinic keratosis: Secondary | ICD-10-CM

## 2021-06-12 MED ORDER — AMINOLEVULINIC ACID HCL 20 % EX SOLR
1.0000 "application " | Freq: Once | CUTANEOUS | Status: AC
  Administered 2021-06-12: 354 mg via TOPICAL

## 2021-06-12 NOTE — Progress Notes (Signed)
Patient completed PDT therapy today.  1. AK (actinic keratosis) Head - Anterior (Face)  Photodynamic therapy - Head - Anterior (Face) Procedure discussed: discussed risks, benefits, side effects. and alternatives   Prep: site scrubbed/prepped with acetone   Location:  Face Number of lesions:  Multiple Type of treatment:  Blue light Aminolevulinic Acid (see MAR for details): Levulan Number of Levulan sticks used:  1 Incubation time (minutes):  60 Number of minutes under lamp:  16 Number of seconds under lamp:  40 Cooling:  Floor fan Outcome: patient tolerated procedure well with no complications   Post-procedure details: sunscreen applied    Aminolevulinic Acid HCl 20 % SOLR 354 mg - Head - Anterior (Face)   Patient provided samples of Solbar Sunscreen and Vanicream face wash & moisturizer.   Johnsie Kindred, RMA   Documentation: I have reviewed the above documentation for accuracy and completeness, and I agree with the above.  Brendolyn Patty MD

## 2021-06-12 NOTE — Patient Instructions (Signed)

## 2021-06-18 ENCOUNTER — Ambulatory Visit: Payer: Medicare Other

## 2021-06-23 ENCOUNTER — Other Ambulatory Visit: Payer: Self-pay | Admitting: Dermatology

## 2021-06-23 DIAGNOSIS — L0100 Impetigo, unspecified: Secondary | ICD-10-CM

## 2021-06-27 ENCOUNTER — Ambulatory Visit: Payer: Medicare Other | Admitting: Dermatology

## 2021-07-16 ENCOUNTER — Ambulatory Visit: Payer: Medicare Other | Admitting: Dermatology

## 2021-07-16 ENCOUNTER — Encounter: Payer: Self-pay | Admitting: Dermatology

## 2021-07-16 ENCOUNTER — Other Ambulatory Visit: Payer: Self-pay | Admitting: Dermatology

## 2021-07-16 ENCOUNTER — Other Ambulatory Visit: Payer: Self-pay

## 2021-07-16 DIAGNOSIS — L578 Other skin changes due to chronic exposure to nonionizing radiation: Secondary | ICD-10-CM

## 2021-07-16 DIAGNOSIS — B009 Herpesviral infection, unspecified: Secondary | ICD-10-CM

## 2021-07-16 DIAGNOSIS — L570 Actinic keratosis: Secondary | ICD-10-CM | POA: Diagnosis not present

## 2021-07-16 DIAGNOSIS — L821 Other seborrheic keratosis: Secondary | ICD-10-CM | POA: Diagnosis not present

## 2021-07-16 DIAGNOSIS — Z85828 Personal history of other malignant neoplasm of skin: Secondary | ICD-10-CM

## 2021-07-16 DIAGNOSIS — L988 Other specified disorders of the skin and subcutaneous tissue: Secondary | ICD-10-CM

## 2021-07-16 NOTE — Patient Instructions (Addendum)
?Seborrheic Keratosis ? ?What causes seborrheic keratoses? ?Seborrheic keratoses are harmless, common skin growths that first appear during adult life.  As time goes by, more growths appear.  Some people may develop a large number of them.  Seborrheic keratoses appear on both covered and uncovered body parts.  They are not caused by sunlight.  The tendency to develop seborrheic keratoses can be inherited.  They vary in color from skin-colored to gray, brown, or even black.  They can be either smooth or have a rough, warty surface.   ?Seborrheic keratoses are superficial and look as if they were stuck on the skin.  Under the microscope this type of keratosis looks like layers upon layers of skin.  That is why at times the top layer may seem to fall off, but the rest of the growth remains and re-grows.   ? ?Treatment ?Seborrheic keratoses do not need to be treated, but can easily be removed in the office.  Seborrheic keratoses often cause symptoms when they rub on clothing or jewelry.  Lesions can be in the way of shaving.  If they become inflamed, they can cause itching, soreness, or burning.  Removal of a seborrheic keratosis can be accomplished by freezing, burning, or surgery. ?If any spot bleeds, scabs, or grows rapidly, please return to have it checked, as these can be an indication of a skin cancer. ? ? ?If You Need Anything After Your Visit ? ?If you have any questions or concerns for your doctor, please call our main line at [phone] and press option 4 to reach your doctor's medical assistant. If no one answers, please leave a voicemail as directed and we will return your call as soon as possible. Messages left after 4 pm will be answered the following business day.  ? ?You may also send [place] a message via MyChart. We typically respond to MyChart messages within 1-2 business days. ? ?For prescription refills, please ask your pharmacy to contact our office. Our fax number is [phone]. ? ?If you have an  urgent issue when the clinic is closed that cannot wait until the next business day, you can page your doctor at the number below.   ? ?Please note that while we do our best to be available for urgent issues outside of office hours, we are not available 24/7.  ? ?If you have an urgent issue and are unable to reach [place], you may choose to seek medical care at your doctor's office, retail clinic, urgent care center, or emergency room. ? ?If you have a medical emergency, please immediately call 911 or go to the emergency department. ? ?Pager Numbers ? ?- Dr. Nehemiah Massed: [phone] ? ?- Dr. Laurence Ferrari: [phone] ? ?- Dr. Nicole Kindred: [phone] ? ?In the event of inclement weather, please call our main line at [phone] for an update on the status of any delays or closures. ? ?Dermatology Medication Tips: ?Please keep the boxes that topical medications come in in order to help keep track of the instructions about where and how to use these. Pharmacies typically print the medication instructions only on the boxes and not directly on the medication tubes.  ? ?If your medication is too expensive, please contact our office at [phone] option 4 or send [place] a message through [place].  ? ?We are unable to tell what your co-pay for medications will be in advance as this is different depending on your insurance coverage. However, we may be able to find a substitute medication at lower cost  or fill out paperwork to get insurance to cover a needed medication.  ? ?If a prior authorization is required to get your medication covered by your insurance company, please allow [place] 1-2 business days to complete this process. ? ?Drug prices often vary depending on where the prescription is filled and some pharmacies may offer cheaper prices. ? ?The website www.goodrx.com contains coupons for medications through different pharmacies. The prices here do not account for what the cost may be with help from insurance (it may be cheaper with your  insurance), but the website can give you the price if you did not use any insurance.  ?- You can print the associated coupon and take it with your prescription to the pharmacy.  ?- You may also stop by our office during regular business hours and pick up a GoodRx coupon card.  ?- If you need your prescription sent electronically to a different pharmacy, notify our office through [facility] or by phone at [phone] option 4. ? ? ? ? ?Si Usted Necesita Algo Despu?s de Su Visita ? ?Tambi?n puede enviarnos un mensaje a trav?s de MyChart. Por lo general respondemos a los mensajes de MyChart en el transcurso de 1 a 2 d?as h?biles. ? ?Para renovar recetas, por favor pida a su farmacia que se ponga en contacto con nuestra oficina. Nuestro n?mero de fax es el [phone]. ? ?Si tiene un asunto urgente cuando la cl?nica est? cerrada y que no puede esperar hasta el siguiente d?a h?bil, puede llamar/localizar a su doctor(a) al n?mero que aparece a continuaci?n.  ? ?Por favor, tenga en cuenta que aunque hacemos todo lo posible para estar disponibles para asuntos urgentes fuera del horario de oficina, no estamos disponibles las 24 horas del d?a, los 7 d?as de la semana.  ? ?Si tiene un problema urgente y no puede comunicarse con nosotros, puede optar por buscar atenci?n m?dica  en el consultorio de su doctor(a), en una cl?nica privada, en un centro de atenci?n urgente o en una sala de emergencias. ? ?Si tiene Engineer, maintenance (IT) m?dica, por favor llame inmediatamente al 911 o vaya a la sala de emergencias. ? ?N?meros de b?per ? ?- Dr. Nehemiah Massed: [phone] ? ?- Dra. Moye: [phone] ? ?- Dra. Nicole Kindred: [phone] ? ?En caso de inclemencias del tiempo, por favor llame a nuestra l?nea principal al [phone] para una actualizaci?n sobre el estado de cualquier retraso o cierre. ? ?Consejos para la medicaci?n en dermatolog?a: ?Por favor, guarde las cajas en las que vienen los medicamentos de uso t?pico para ayudarle a  seguir las instrucciones sobre d?nde y c?mo usarlos. Las farmacias generalmente imprimen las instrucciones del medicamento s?lo en las cajas y no directamente en los tubos del [place].  ? ?Si su medicamento es muy caro, por favor, p?ngase en contacto con Zigmund Daniel llamando al [phone] y presione la opci?n 4 o env?enos un mensaje a trav?s de MyChart.  ? ?No podemos decirle cu?l ser? su copago por los medicamentos por adelantado ya que esto es diferente dependiendo de la cobertura de su seguro. Sin embargo, es posible que podamos encontrar un medicamento sustituto a Electrical engineer un formulario para que el seguro cubra el medicamento que se considera necesario.  ? ?Si se requiere Ardelia Mems autorizaci?n previa para que su compa??a de seguros [place] su medicamento, por favor perm?tanos de 1 a 2 d?as h?biles para completar este proceso. ? ?Los precios de los medicamentos var?an con frecuencia dependiendo del Environmental consultant de d?nde se surte la  receta y alguna farmacias pueden ofrecer precios m?s baratos. ? ?El sitio web www.goodrx.com tiene cupones para medicamentos de Airline pilot. Los precios aqu? no tienen en cuenta lo que podr?a costar con la ayuda del seguro (puede ser m?s barato con su seguro), pero el sitio web puede darle el precio si no utiliz? ning?n seguro.  ?- Puede imprimir el cup?n correspondiente y llevarlo con su receta a la farmacia.  ?- Tambi?n puede pasar por nuestra oficina durante el horario de atenci?n regular y recoger una tarjeta de cupones de GoodRx.  ?- Si necesita que su receta se env?e electr?nicamente a Chiropodist, informe a nuestra oficina a trav?s de MyChart de [place] o por tel?fono llamando al [phone] y presione la opci?n 4.  ?

## 2021-07-16 NOTE — Progress Notes (Signed)
? ?  Follow-Up Visit ?  ?Subjective  ?Jessica Haas is a 71 y.o. female who presents for the following: Actinic Keratosis (Face, f/u, PDT with ALA 06/12/21) and check spot (Chest, just noticed). ? ?The patient has spots, moles and lesions to be evaluated, some may be new or changing and the patient has concerns that these could be cancer. ? ? ?The following portions of the chart were reviewed this encounter and updated as appropriate:  ?  ?  ? ?Review of Systems:  No other skin or systemic complaints except as noted in HPI or Assessment and Plan. ? ?Objective  ?Well appearing patient in no apparent distress; mood and affect are within normal limits. ? ?A focused examination was performed including face, ears, chest, back, L medial calf. Relevant physical exam findings are noted in the Assessment and Plan. ? ?R intermammary ?Stuck-on, waxy, tan-brown papule -- Discussed benign etiology and prognosis.  ? ?L medial calf ?Superficial erosion at [facility] site ? ?nasal dorsum, L nasal rim, mid forehead, bil ear helix ?Pink scaly macules ? ? ? ?Assessment & Plan  ? ?Seborrheic Keratoses ?- Stuck-on, waxy, tan-brown papules and/or plaques  ?- Benign-appearing ?- Discussed benign etiology and prognosis. ?- Observe ?- Call for any changes ?- back ? ?Actinic Damage ?- chronic, secondary to cumulative UV radiation exposure/sun exposure over time ?- diffuse scaly erythematous macules with underlying dyspigmentation ?- Recommend daily broad spectrum sunscreen SPF 30+ to sun-exposed areas, reapply every 2 hours as needed.  ?- Recommend staying in the shade or wearing long sleeves, sun glasses (UVA+UVB protection) and wide brim hats (4-inch brim around the entire circumference of the hat). ?- Call for new or changing lesions.  ? ?Seborrheic keratosis ?R intermammary ? ?Reassured benign age-related growth.  Recommend observation.  Discussed cryotherapy if spot(s) become irritated or inflamed.  ? ?History of SCC (squamous cell  carcinoma) of skin ?L medial calf ? ?Healing. Observe for recurrence. Call clinic for new or changing lesions.  Recommend regular skin exams, daily broad-spectrum spf 30+ sunscreen use, and photoprotection.   ? ?Continue vaseline and cover qd ? ?AK (actinic keratosis) ?nasal dorsum, L nasal rim, mid forehead, bil ear helix ? ?No treatment today, pt has a trip coming up.  Pt will return to clinic for cryotherapy ? ? ?Return for pt to be scheduled for AK treatment 08/08/21. ? ?I, Othelia Pulling, RMA, am acting as scribe for Brendolyn Patty, MD . ? ?Documentation: I have reviewed the above documentation for accuracy and completeness, and I agree with the above. ? ?Brendolyn Patty MD  ? ?

## 2021-08-08 ENCOUNTER — Ambulatory Visit: Payer: Medicare Other | Admitting: Dermatology

## 2021-08-08 DIAGNOSIS — L578 Other skin changes due to chronic exposure to nonionizing radiation: Secondary | ICD-10-CM | POA: Diagnosis not present

## 2021-08-08 DIAGNOSIS — L820 Inflamed seborrheic keratosis: Secondary | ICD-10-CM

## 2021-08-08 DIAGNOSIS — Z85828 Personal history of other malignant neoplasm of skin: Secondary | ICD-10-CM

## 2021-08-08 DIAGNOSIS — L570 Actinic keratosis: Secondary | ICD-10-CM

## 2021-08-08 NOTE — Progress Notes (Signed)
? ?Follow-Up Visit ?  ?Subjective  ?Jessica Haas is a 71 y.o. female who presents for the following: Actinic Keratosis (Hx of aks at  nasal dorsum, L nasal rim, mid forehead, bil ear helix. Did not have treated at last visit due to trip. Here for treatment today. Also reports a few spots at forehead and chest she would like checked. //). They are itchy and get irritated. ? ?The patient has spots, moles and lesions to be evaluated, some may be new or changing and the patient has concerns that these could be cancer. ? ? ?The following portions of the chart were reviewed this encounter and updated as appropriate:   ?  ? ?Review of Systems: No other skin or systemic complaints except as noted in HPI or Assessment and Plan. ? ? ?Objective  ?Well appearing patient in no apparent distress; mood and affect are within normal limits. ? ?A focused examination was performed including face, chest, left leg b/l ears. Relevant physical exam findings are noted in the Assessment and Plan. ? ?mid forehead x 1, nasal dorsum x 1,left lower ear helix x 1, ear lobe x 1 (4) ?Erythematous thin papules/macules with gritty scale.  ? ?left upper forehead x 1, left right intermammary x 1 (2) ?Erythematous stuck-on, waxy papule or plaque ? ? ?Assessment & Plan  ?Actinic keratosis (4) ?mid forehead x 1, nasal dorsum x 1,left lower ear helix x 1, ear lobe x 1 ? ?Actinic keratoses are precancerous spots that appear secondary to cumulative UV radiation exposure/sun exposure over time. They are chronic with expected duration over 1 year. A portion of actinic keratoses will progress to squamous cell carcinoma of the skin. It is not possible to reliably predict which spots will progress to skin cancer and so treatment is recommended to prevent development of skin cancer. ? ?Recommend daily broad spectrum sunscreen SPF 30+ to sun-exposed areas, reapply every 2 hours as needed.  ?Recommend staying in the shade or wearing long sleeves, sun glasses  (UVA+UVB protection) and wide brim hats (4-inch brim around the entire circumference of the hat). ?Call for new or changing lesions. ? ?Destruction of lesion - mid forehead x 1, nasal dorsum x 1,left lower ear helix x 1, ear lobe x 1 ? ?Destruction method: cryotherapy   ?Informed consent: discussed and consent obtained   ?Lesion destroyed using liquid nitrogen: Yes   ?Region frozen until ice ball extended beyond lesion: Yes   ?Outcome: patient tolerated procedure well with no complications   ?Post-procedure details: wound care instructions given   ?Additional details:  Prior to procedure, discussed risks of blister formation, small wound, skin dyspigmentation, or rare scar following cryotherapy. Recommend Vaseline ointment to treated areas while healing. ? ? ?Inflamed seborrheic keratosis (2) ?left upper forehead x 1, left right intermammary x 1 ? ?Destruction of lesion - left upper forehead x 1, left right intermammary x 1 ? ?Destruction method: cryotherapy   ?Informed consent: discussed and consent obtained   ?Lesion destroyed using liquid nitrogen: Yes   ?Region frozen until ice ball extended beyond lesion: Yes   ?Outcome: patient tolerated procedure well with no complications   ?Post-procedure details: wound care instructions given   ?Additional details:  Prior to procedure, discussed risks of blister formation, small wound, skin dyspigmentation, or rare scar following cryotherapy. Recommend Vaseline ointment to treated areas while healing. ? ? ? ?Actinic Damage - Severe, confluent actinic changes with pre-cancerous actinic keratoses  ?- Severe, chronic, not at goal, secondary to cumulative  UV radiation exposure over time ?- diffuse scaly erythematous macules and papules with underlying dyspigmentation ?- Discussed Prescription "Field Treatment" for Severe, Chronic Confluent Actinic Changes with Pre-Cancerous Actinic Keratoses ?Field treatment involves treatment of an entire area of skin that has confluent  Actinic Changes (Sun/ Ultraviolet light damage) and PreCancerous Actinic Keratoses by method of PhotoDynamic Therapy (PDT) and/or prescription Topical Chemotherapy agents such as 5-fluorouracil, 5-fluorouracil/calcipotriene, and/or imiquimod.  The purpose is to decrease the number of clinically evident and subclinical PreCancerous lesions to prevent progression to development of skin cancer by chemically destroying early precancer changes that may or may not be visible.  It has been shown to reduce the risk of developing skin cancer in the treated area. As a result of treatment, redness, scaling, crusting, and open sores may occur during treatment course. One or more than one of these methods may be used and may have to be used several times to control, suppress and eliminate the PreCancerous changes. Discussed treatment course, expected reaction, and possible side effects. ?- Recommend daily broad spectrum sunscreen SPF 30+ to sun-exposed areas, reapply every 2 hours as needed.  ?- Staying in the shade or wearing long sleeves, sun glasses (UVA+UVB protection) and wide brim hats (4-inch brim around the entire circumference of the hat) are also recommended. ?- Call for new or changing lesions. ?- patient will schedule photodynamic therapy to face x 2 tx in the in fall  ? ?History of Squamous Cell Carcinoma of the Skin ?- No evidence of recurrence today at left medial calf  ?- Recommend regular full body skin exams ?- Recommend daily broad spectrum sunscreen SPF 30+ to sun-exposed areas, reapply every 2 hours as needed.  ?- Call if any new or changing lesions are noted between office visits ? ? ?Return in about 6 months (around 02/07/2022) for TBSE. ?I, Ruthell Rummage, CMA, am acting as scribe for Brendolyn Patty, MD. ? ?Documentation: I have reviewed the above documentation for accuracy and completeness, and I agree with the above. ? ?Brendolyn Patty MD  ? ?

## 2021-08-08 NOTE — Patient Instructions (Addendum)
Actinic keratoses are precancerous spots that appear secondary to cumulative UV radiation exposure/sun exposure over time. They are chronic with expected duration over 1 year. A portion of actinic keratoses will progress to squamous cell carcinoma of the skin. It is not possible to reliably predict which spots will progress to skin cancer and so treatment is recommended to prevent development of skin cancer. ? ?Recommend daily broad spectrum sunscreen SPF 30+ to sun-exposed areas, reapply every 2 hours as needed.  ?Recommend staying in the shade or wearing long sleeves, sun glasses (UVA+UVB protection) and wide brim hats (4-inch brim around the entire circumference of the hat). ?Call for new or changing lesions.  ? ?Cryotherapy Aftercare ? ?Wash gently with soap and water everyday.   ?Apply Vaseline and Band-Aid daily until healed.  ? ? ?Seborrheic Keratosis ? ?What causes seborrheic keratoses? ?Seborrheic keratoses are harmless, common skin growths that first appear during adult life.  As time goes by, more growths appear.  Some people may develop a large number of them.  Seborrheic keratoses appear on both covered and uncovered body parts.  They are not caused by sunlight.  The tendency to develop seborrheic keratoses can be inherited.  They vary in color from skin-colored to gray, brown, or even black.  They can be either smooth or have a rough, warty surface.   ?Seborrheic keratoses are superficial and look as if they were stuck on the skin.  Under the microscope this type of keratosis looks like layers upon layers of skin.  That is why at times the top layer may seem to fall off, but the rest of the growth remains and re-grows.   ? ?Treatment ?Seborrheic keratoses do not need to be treated, but can easily be removed in the office.  Seborrheic keratoses often cause symptoms when they rub on clothing or jewelry.  Lesions can be in the way of shaving.  If they become inflamed, they can cause itching, soreness, or  burning.  Removal of a seborrheic keratosis can be accomplished by freezing, burning, or surgery. ?If any spot bleeds, scabs, or grows rapidly, please return to have it checked, as these can be an indication of a skin cancer. ? ?If You Need Anything After Your Visit ? ?If you have any questions or concerns for your doctor, please call our main line at [phone] and press option 4 to reach your doctor's medical assistant. If no one answers, please leave a voicemail as directed and we will return your call as soon as possible. Messages left after 4 pm will be answered the following business day.  ? ?You may also send [place] a message via MyChart. We typically respond to MyChart messages within 1-2 business days. ? ?For prescription refills, please ask your pharmacy to contact our office. Our fax number is [phone]. ? ?If you have an urgent issue when the clinic is closed that cannot wait until the next business day, you can page your doctor at the number below.   ? ?Please note that while we do our best to be available for urgent issues outside of office hours, we are not available 24/7.  ? ?If you have an urgent issue and are unable to reach [place], you may choose to seek medical care at your doctor's office, retail clinic, urgent care center, or emergency room. ? ?If you have a medical emergency, please immediately call 911 or go to the emergency department. ? ?Pager Numbers ? ?- Dr. Nehemiah Massed: [phone] ? ?- Dr. Laurence Ferrari: [phone] ? ?-  Dr. Nicole Kindred: [phone] ? ?In the event of inclement weather, please call our main line at [phone] for an update on the status of any delays or closures. ? ?Dermatology Medication Tips: ?Please keep the boxes that topical medications come in in order to help keep track of the instructions about where and how to use these. Pharmacies typically print the medication instructions only on the boxes and not directly on the medication tubes.  ? ?If your medication is too expensive,  please contact our office at [phone] option 4 or send [place] a message through [place].  ? ?We are unable to tell what your co-pay for medications will be in advance as this is different depending on your insurance coverage. However, we may be able to find a substitute medication at lower cost or fill out paperwork to get insurance to cover a needed medication.  ? ?If a prior authorization is required to get your medication covered by your insurance company, please allow [place] 1-2 business days to complete this process. ? ?Drug prices often vary depending on where the prescription is filled and some pharmacies may offer cheaper prices. ? ?The website www.goodrx.com contains coupons for medications through different pharmacies. The prices here do not account for what the cost may be with help from insurance (it may be cheaper with your insurance), but the website can give you the price if you did not use any insurance.  ?- You can print the associated coupon and take it with your prescription to the pharmacy.  ?- You may also stop by our office during regular business hours and pick up a GoodRx coupon card.  ?- If you need your prescription sent electronically to a different pharmacy, notify our office through [facility] or by phone at [phone] option 4. ? ? ? ? ?Si Usted Necesita Algo Despu?s de Su Visita ? ?Tambi?n puede enviarnos un mensaje a trav?s de MyChart. Por lo general respondemos a los mensajes de MyChart en el transcurso de 1 a 2 d?as h?biles. ? ?Para renovar recetas, por favor pida a su farmacia que se ponga en contacto con nuestra oficina. Nuestro n?mero de fax es el [phone]. ? ?Si tiene un asunto urgente cuando la cl?nica est? cerrada y que no puede esperar hasta el siguiente d?a h?bil, puede llamar/localizar a su doctor(a) al n?mero que aparece a continuaci?n.  ? ?Por favor, tenga en cuenta que aunque hacemos todo lo posible para estar disponibles para asuntos urgentes fuera del  horario de oficina, no estamos disponibles las 24 horas del d?a, los 7 d?as de la semana.  ? ?Si tiene un problema urgente y no puede comunicarse con nosotros, puede optar por buscar atenci?n m?dica  en el consultorio de su doctor(a), en una cl?nica privada, en un centro de atenci?n urgente o en una sala de emergencias. ? ?Si tiene Engineer, maintenance (IT) m?dica, por favor llame inmediatamente al 911 o vaya a la sala de emergencias. ? ?N?meros de b?per ? ?- Dr. Nehemiah Massed: [phone] ? ?- Dra. Moye: [phone] ? ?- Dra. Nicole Kindred: [phone] ? ?En caso de inclemencias del tiempo, por favor llame a nuestra l?nea principal al [phone] para una actualizaci?n sobre el estado de cualquier retraso o cierre. ? ?Consejos para la medicaci?n en dermatolog?a: ?Por favor, guarde las cajas en las que vienen los medicamentos de uso t?pico para ayudarle a seguir las instrucciones sobre d?nde y c?mo usarlos. Las farmacias generalmente imprimen las instrucciones del medicamento s?lo en las cajas y no directamente en los tubos  del medicamento.  ? ?Si su medicamento es muy caro, por favor, p?ngase en contacto con Zigmund Daniel llamando al [phone] y presione la opci?n 4 o env?enos un mensaje a trav?s de MyChart.  ? ?No podemos decirle cu?l ser? su copago por los medicamentos por adelantado ya que esto es diferente dependiendo de la cobertura de su seguro. Sin embargo, es posible que podamos encontrar un medicamento sustituto a Electrical engineer un formulario para que el seguro cubra el medicamento que se considera necesario.  ? ?Si se requiere Ardelia Mems autorizaci?n previa para que su compa??a de seguros [place] su medicamento, por favor perm?tanos de 1 a 2 d?as h?biles para completar este proceso. ? ?Los precios de los medicamentos var?an con frecuencia dependiendo del Environmental consultant de d?nde se surte la receta y alguna farmacias pueden ofrecer precios m?s baratos. ? ?El sitio web www.goodrx.com tiene cupones para medicamentos de Office manager. Los precios aqu? no tienen en cuenta lo que podr?a costar con la ayuda del seguro (puede ser m?s barato con su seguro), pero el sitio web puede darle el precio si no utiliz? ning?n seguro.  ?- Puede

## 2021-08-21 ENCOUNTER — Encounter: Payer: Self-pay | Admitting: Dermatology

## 2021-08-21 ENCOUNTER — Ambulatory Visit: Payer: Self-pay | Admitting: Dermatology

## 2021-08-21 DIAGNOSIS — L988 Other specified disorders of the skin and subcutaneous tissue: Secondary | ICD-10-CM

## 2021-08-21 NOTE — Patient Instructions (Addendum)
Heron Nay  or Amy Vickki Muff both opthalmologist at [facility] ? ? ? ?If You Need Anything After Your Visit ? ?If you have any questions or concerns for your doctor, please call our main line at [phone] and press option 4 to reach your doctor's medical assistant. If no one answers, please leave a voicemail as directed and we will return your call as soon as possible. Messages left after 4 pm will be answered the following business day.  ? ?You may also send [place] a message via MyChart. We typically respond to MyChart messages within 1-2 business days. ? ?For prescription refills, please ask your pharmacy to contact our office. Our fax number is [phone]. ? ?If you have an urgent issue when the clinic is closed that cannot wait until the next business day, you can page your doctor at the number below.   ? ?Please note that while we do our best to be available for urgent issues outside of office hours, we are not available 24/7.  ? ?If you have an urgent issue and are unable to reach [place], you may choose to seek medical care at your doctor's office, retail clinic, urgent care center, or emergency room. ? ?If you have a medical emergency, please immediately call 911 or go to the emergency department. ? ?Pager Numbers ? ?- Dr. Nehemiah Massed: [phone] ? ?- Dr. Laurence Ferrari: [phone] ? ?- Dr. Nicole Kindred: [phone] ? ?In the event of inclement weather, please call our main line at [phone] for an update on the status of any delays or closures. ? ?Dermatology Medication Tips: ?Please keep the boxes that topical medications come in in order to help keep track of the instructions about where and how to use these. Pharmacies typically print the medication instructions only on the boxes and not directly on the medication tubes.  ? ?If your medication is too expensive, please contact our office at [phone] option 4 or send [place] a message through [place].  ? ?We are unable to tell what your co-pay for medications will  be in advance as this is different depending on your insurance coverage. However, we may be able to find a substitute medication at lower cost or fill out paperwork to get insurance to cover a needed medication.  ? ?If a prior authorization is required to get your medication covered by your insurance company, please allow [place] 1-2 business days to complete this process. ? ?Drug prices often vary depending on where the prescription is filled and some pharmacies may offer cheaper prices. ? ?The website www.goodrx.com contains coupons for medications through different pharmacies. The prices here do not account for what the cost may be with help from insurance (it may be cheaper with your insurance), but the website can give you the price if you did not use any insurance.  ?- You can print the associated coupon and take it with your prescription to the pharmacy.  ?- You may also stop by our office during regular business hours and pick up a GoodRx coupon card.  ?- If you need your prescription sent electronically to a different pharmacy, notify our office through [facility] or by phone at [phone] option 4. ? ? ? ? ?Si Usted Necesita Algo Despu?s de Su Visita ? ?Tambi?n puede enviarnos un mensaje a trav?s de MyChart. Por lo general respondemos a los mensajes de MyChart en el transcurso de 1 a 2 d?as h?biles. ? ?Para renovar recetas, por favor pida a su farmacia que se ponga en  contacto con nuestra oficina. Nuestro n?mero de fax es el [phone]. ? ?Si tiene un asunto urgente cuando la cl?nica est? cerrada y que no puede esperar hasta el siguiente d?a h?bil, puede llamar/localizar a su doctor(a) al n?mero que aparece a continuaci?n.  ? ?Por favor, tenga en cuenta que aunque hacemos todo lo posible para estar disponibles para asuntos urgentes fuera del horario de oficina, no estamos disponibles las 24 horas del d?a, los 7 d?as de la semana.  ? ?Si tiene un problema urgente y no puede comunicarse con nosotros,  puede optar por buscar atenci?n m?dica  en el consultorio de su doctor(a), en una cl?nica privada, en un centro de atenci?n urgente o en una sala de emergencias. ? ?Si tiene Engineer, maintenance (IT) m?dica, por favor llame inmediatamente al 911 o vaya a la sala de emergencias. ? ?N?meros de b?per ? ?- Dr. Nehemiah Massed: [phone] ? ?- Dra. Moye: [phone] ? ?- Dra. Nicole Kindred: [phone] ? ?En caso de inclemencias del tiempo, por favor llame a nuestra l?nea principal al [phone] para una actualizaci?n sobre el estado de cualquier retraso o cierre. ? ?Consejos para la medicaci?n en dermatolog?a: ?Por favor, guarde las cajas en las que vienen los medicamentos de uso t?pico para ayudarle a seguir las instrucciones sobre d?nde y c?mo usarlos. Las farmacias generalmente imprimen las instrucciones del medicamento s?lo en las cajas y no directamente en los tubos del [place].  ? ?Si su medicamento es muy caro, por favor, p?ngase en contacto con Zigmund Daniel llamando al [phone] y presione la opci?n 4 o env?enos un mensaje a trav?s de MyChart.  ? ?No podemos decirle cu?l ser? su copago por los medicamentos por adelantado ya que esto es diferente dependiendo de la cobertura de su seguro. Sin embargo, es posible que podamos encontrar un medicamento sustituto a Electrical engineer un formulario para que el seguro cubra el medicamento que se considera necesario.  ? ?Si se requiere Ardelia Mems autorizaci?n previa para que su compa??a de seguros [place] su medicamento, por favor perm?tanos de 1 a 2 d?as h?biles para completar este proceso. ? ?Los precios de los medicamentos var?an con frecuencia dependiendo del Environmental consultant de d?nde se surte la receta y alguna farmacias pueden ofrecer precios m?s baratos. ? ?El sitio web www.goodrx.com tiene cupones para medicamentos de Airline pilot. Los precios aqu? no tienen en cuenta lo que podr?a costar con la ayuda del seguro (puede ser m?s barato con su seguro), pero el sitio web puede darle el  precio si no utiliz? ning?n seguro.  ?- Puede imprimir el cup?n correspondiente y llevarlo con su receta a la farmacia.  ?- Tambi?n puede pasar por nuestra oficina durante el horario de atenci?n regular y recoger una tarjeta de cupones de GoodRx.  ?- Si necesita que su receta se env?e electr?nicamente a Chiropodist, informe a nuestra oficina a trav?s de MyChart de [place] o por tel?fono llamando al [phone] y presione la opci?n 4.  ?

## 2021-08-21 NOTE — Progress Notes (Signed)
? ?  Follow-Up Visit ?  ?Subjective  ?Jessica Haas is a 71 y.o. female who presents for the following: Facial Elastosis (Face, pt presents for botox). ? ?The following portions of the chart were reviewed this encounter and updated as appropriate:  Tobacco  Allergies  Meds  Problems  Med Hx  Surg Hx  Fam Hx   ?  ?Review of Systems: No other skin or systemic complaints except as noted in HPI or Assessment and Plan. ? ?Objective  ?Well appearing patient in no apparent distress; mood and affect are within normal limits. ? ?A focused examination was performed including face. Relevant physical exam findings are noted in the Assessment and Plan. ? ?face ?Rhytides and volume loss.  ? ? ? ? ? ?Assessment & Plan  ?Elastosis of skin ?face ? ?Botox  units injected today to: ?- Frown complex 22.5 ?- Forehead 10 units ?- Crows feet 10 units x 2 ?- Upper lip 4 units ?- Lower lip 2 units ?- DAO's 2.5 x 2 units ? ?Botox Injection - face ?Location: Frown complex, forehead, bil crow's feet, upper lip, lower lip, DAO's ? ?Informed consent: Discussed risks (infection, pain, bleeding, bruising, swelling, allergic reaction, paralysis of nearby muscles, eyelid droop, double vision, neck weakness, difficulty breathing, headache, undesirable cosmetic result, and need for additional treatment) and benefits of the procedure, as well as the alternatives.  Informed consent was obtained. ? ?Preparation: The area was cleansed with alcohol. ? ?Procedure Details:  Botox was injected into the dermis with a 30-gauge needle. Pressure applied to any bleeding. Ice packs offered for swelling. ? ?Lot Number:  [id number] ?Expiration:  07/2023 ? ?Total Units Injected:  63.5 ? ?Plan: Patient was instructed to remain upright for 4 hours. Patient was instructed to avoid massaging the face and avoid vigorous exercise for the rest of the day. Tylenol may be used for headache.  Allow 2 weeks before returning to clinic for additional dosing as needed.  Patient will call for any problems. ? ? ?Related Medications ?ALTRENO 0.05 % LOTN ?Apply 1 application topically at bedtime. ? ?valACYclovir (VALTREX) 500 MG tablet ?TAKE 1 TABLET BY MOUTH 2 TIMES DAILY FOR13 DAYS. BEGIN 3 DAYS PRIOR TO PROCEDURE. ? ? ?Return for 3-44mBotox. ? ?I, JEmelia Salisbury CMA, am acting as scribe for DSarina Ser MD. ? ?I, SOthelia Pulling RMA, am acting as scribe for DSarina Ser MD . ?Documentation: I have reviewed the above documentation for accuracy and completeness, and I agree with the above. ? ?DSarina Ser MD ? ? ?

## 2021-08-29 ENCOUNTER — Encounter: Payer: Self-pay | Admitting: Dermatology

## 2021-11-22 ENCOUNTER — Encounter: Payer: Self-pay | Admitting: Dermatology

## 2021-11-22 ENCOUNTER — Ambulatory Visit (INDEPENDENT_AMBULATORY_CARE_PROVIDER_SITE_OTHER): Payer: Self-pay | Admitting: Dermatology

## 2021-11-22 DIAGNOSIS — L988 Other specified disorders of the skin and subcutaneous tissue: Secondary | ICD-10-CM

## 2021-11-22 NOTE — Patient Instructions (Signed)
Due to recent changes in healthcare laws, you may see results of your pathology and/or laboratory studies on MyChart before the doctors have had a chance to review them. We understand that in some cases there may be results that are confusing or concerning to you. Please understand that not all results are received at the same time and often the doctors may need to interpret multiple results in order to provide you with the best plan of care or course of treatment. Therefore, we ask that you please give us 2 business days to thoroughly review all your results before contacting the office for clarification. Should we see a critical lab result, you will be contacted sooner.   If You Need Anything After Your Visit  If you have any questions or concerns for your doctor, please call our main line at [phone] and press option 4 to reach your doctor's medical assistant. If no one answers, please leave a voicemail as directed and we will return your call as soon as possible. Messages left after 4 pm will be answered the following business day.   You may also send us a message via MyChart. We typically respond to MyChart messages within 1-2 business days.  For prescription refills, please ask your pharmacy to contact our office. Our fax number is [phone].  If you have an urgent issue when the clinic is closed that cannot wait until the next business day, you can page your doctor at the number below.    Please note that while we do our best to be available for urgent issues outside of office hours, we are not available 24/7.   If you have an urgent issue and are unable to reach us, you may choose to seek medical care at your doctor's office, retail clinic, urgent care center, or emergency room.  If you have a medical emergency, please immediately call 911 or go to the emergency department.  Pager Numbers  - Dr. Kowalski: [phone]  - Dr. Moye: [phone]  - Dr. Stewart:  [phone]  In the event of inclement weather, please call our main line at [phone] for an update on the status of any delays or closures.  Dermatology Medication Tips: Please keep the boxes that topical medications come in in order to help keep track of the instructions about where and how to use these. Pharmacies typically print the medication instructions only on the boxes and not directly on the medication tubes.   If your medication is too expensive, please contact our office at [phone] option 4 or send us a message through MyChart.   We are unable to tell what your co-pay for medications will be in advance as this is different depending on your insurance coverage. However, we may be able to find a substitute medication at lower cost or fill out paperwork to get insurance to cover a needed medication.   If a prior authorization is required to get your medication covered by your insurance company, please allow us 1-2 business days to complete this process.  Drug prices often vary depending on where the prescription is filled and some pharmacies may offer cheaper prices.  The website www.goodrx.com contains coupons for medications through different pharmacies. The prices here do not account for what the cost may be with help from insurance (it may be cheaper with your insurance), but the website can give you the price if you did not use any insurance.  - You can print the associated coupon and take it with   your prescription to the pharmacy.  - You may also stop by our office during regular business hours and pick up a GoodRx coupon card.  - If you need your prescription sent electronically to a different pharmacy, notify our office through [place] MyChart or by phone at [phone] option 4.     Si Usted Necesita Algo Despus de Su Visita  Tambin puede enviarnos un mensaje a travs de MyChart. Por lo general respondemos a los mensajes de MyChart en el transcurso de 1 a 2  das hbiles.  Para renovar recetas, por favor pida a su farmacia que se ponga en contacto con nuestra oficina. Nuestro nmero de fax es el [phone].  Si tiene un asunto urgente cuando la clnica est cerrada y que no puede esperar hasta el siguiente da hbil, puede llamar/localizar a su doctor(a) al nmero que aparece a continuacin.   Por favor, tenga en cuenta que aunque hacemos todo lo posible para estar disponibles para asuntos urgentes fuera del horario de oficina, no estamos disponibles las 24 horas del da, los 7 das de la semana.   Si tiene un problema urgente y no puede comunicarse con nosotros, puede optar por buscar atencin mdica  en el consultorio de su doctor(a), en una clnica privada, en un centro de atencin urgente o en una sala de emergencias.  Si tiene una emergencia mdica, por favor llame inmediatamente al 911 o vaya a la sala de emergencias.  Nmeros de bper  - Dr. Kowalski: [phone]  - Dra. Moye: [phone]  - Dra. Stewart: [phone]  En caso de inclemencias del tiempo, por favor llame a nuestra lnea principal al [phone] para una actualizacin sobre el estado de cualquier retraso o cierre.  Consejos para la medicacin en dermatologa: Por favor, guarde las cajas en las que vienen los medicamentos de uso tpico para ayudarle a seguir las instrucciones sobre dnde y cmo usarlos. Las farmacias generalmente imprimen las instrucciones del medicamento slo en las cajas y no directamente en los tubos del medicamento.   Si su medicamento es muy caro, por favor, pngase en contacto con nuestra oficina llamando al [phone] y presione la opcin 4 o envenos un mensaje a travs de MyChart.   No podemos decirle cul ser su copago por los medicamentos por adelantado ya que esto es diferente dependiendo de la cobertura de su seguro. Sin embargo, es posible que podamos encontrar un medicamento sustituto a menor costo o llenar un formulario para que el  seguro cubra el medicamento que se considera necesario.   Si se requiere una autorizacin previa para que su compaa de seguros cubra su medicamento, por favor permtanos de 1 a 2 das hbiles para completar este proceso.  Los precios de los medicamentos varan con frecuencia dependiendo del lugar de dnde se surte la receta y alguna farmacias pueden ofrecer precios ms baratos.  El sitio web www.goodrx.com tiene cupones para medicamentos de diferentes farmacias. Los precios aqu no tienen en cuenta lo que podra costar con la ayuda del seguro (puede ser ms barato con su seguro), pero el sitio web puede darle el precio si no utiliz ningn seguro.  - Puede imprimir el cupn correspondiente y llevarlo con su receta a la farmacia.  - Tambin puede pasar por nuestra oficina durante el horario de atencin regular y recoger una tarjeta de cupones de GoodRx.  - Si necesita que su receta se enve electrnicamente a una farmacia diferente, informe a nuestra oficina a travs de MyChart de [place]   o por telfono llamando al [phone] y presione la opcin 4.  

## 2021-11-22 NOTE — Progress Notes (Signed)
   Follow-Up Visit   Subjective  Jessica Haas is a 71 y.o. female who presents for the following: Facial Elastosis (Here for Botox).  The following portions of the chart were reviewed this encounter and updated as appropriate:  Tobacco  Allergies  Meds  Problems  Med Hx  Surg Hx  Fam Hx      Review of Systems: No other skin or systemic complaints except as noted in HPI or Assessment and Plan.   Objective  Well appearing patient in no apparent distress; mood and affect are within normal limits.  A focused examination was performed including face. Relevant physical exam findings are noted in the Assessment and Plan.  face Rhytides and volume loss.       Assessment & Plan  Elastosis of skin face  Botox  units injected today to: - Frown complex 22.5 - Forehead 10 units - Crows feet 10 units x 2 - Upper lip 4 units - Lower lip 2 units - DAO's 2.5 x 2 units  Consider switching to 2 rows at forehead next visit.  **Check for delayed bruising.**  Botox Injection - face Location: See attached image  Informed consent: Discussed risks (infection, pain, bleeding, bruising, swelling, allergic reaction, paralysis of nearby muscles, eyelid droop, double vision, neck weakness, difficulty breathing, headache, undesirable cosmetic result, and need for additional treatment) and benefits of the procedure, as well as the alternatives.  Informed consent was obtained.  Preparation: The area was cleansed with alcohol.  Procedure Details:  Botox was injected into the dermis with a 30-gauge needle. Pressure applied to any bleeding. Ice packs offered for swelling.  Lot Number:  [id number] Expiration:  12/2023  Total Units Injected:  63.5  Plan: Tylenol may be used for headache.  Allow 2 weeks before returning to clinic for additional dosing as needed. Patient will call for any problems.   Related Medications ALTRENO 0.05 % LOTN Apply 1 application topically at  bedtime.  valACYclovir (VALTREX) 500 MG tablet TAKE 1 TABLET BY MOUTH 2 TIMES DAILY FOR13 DAYS. BEGIN 3 DAYS PRIOR TO PROCEDURE.   Return in about 3 months (around 02/22/2022) for Botox.  I, Emelia Salisbury, CMA, am acting as scribe for Forest Gleason, MD.  Documentation: I have reviewed the above documentation for accuracy and completeness, and I agree with the above.  Forest Gleason, MD

## 2021-12-06 ENCOUNTER — Encounter: Payer: Self-pay | Admitting: Dermatology

## 2022-01-03 ENCOUNTER — Ambulatory Visit (INDEPENDENT_AMBULATORY_CARE_PROVIDER_SITE_OTHER): Payer: Self-pay | Admitting: Dermatology

## 2022-01-03 DIAGNOSIS — L988 Other specified disorders of the skin and subcutaneous tissue: Secondary | ICD-10-CM

## 2022-01-03 NOTE — Patient Instructions (Signed)
Due to recent changes in healthcare laws, you may see results of your pathology and/or laboratory studies on MyChart before the doctors have had a chance to review them. We understand that in some cases there may be results that are confusing or concerning to you. Please understand that not all results are received at the same time and often the doctors may need to interpret multiple results in order to provide you with the best plan of care or course of treatment. Therefore, we ask that you please give us 2 business days to thoroughly review all your results before contacting the office for clarification. Should we see a critical lab result, you will be contacted sooner.   If You Need Anything After Your Visit  If you have any questions or concerns for your doctor, please call our main line at [phone] and press option 4 to reach your doctor's medical assistant. If no one answers, please leave a voicemail as directed and we will return your call as soon as possible. Messages left after 4 pm will be answered the following business day.   You may also send us a message via MyChart. We typically respond to MyChart messages within 1-2 business days.  For prescription refills, please ask your pharmacy to contact our office. Our fax number is [phone].  If you have an urgent issue when the clinic is closed that cannot wait until the next business day, you can page your doctor at the number below.    Please note that while we do our best to be available for urgent issues outside of office hours, we are not available 24/7.   If you have an urgent issue and are unable to reach us, you may choose to seek medical care at your doctor's office, retail clinic, urgent care center, or emergency room.  If you have a medical emergency, please immediately call 911 or go to the emergency department.  Pager Numbers  - Dr. Kowalski: [phone]  - Dr. Moye: [phone]  - Dr. Stewart:  [phone]  In the event of inclement weather, please call our main line at [phone] for an update on the status of any delays or closures.  Dermatology Medication Tips: Please keep the boxes that topical medications come in in order to help keep track of the instructions about where and how to use these. Pharmacies typically print the medication instructions only on the boxes and not directly on the medication tubes.   If your medication is too expensive, please contact our office at [phone] option 4 or send us a message through MyChart.   We are unable to tell what your co-pay for medications will be in advance as this is different depending on your insurance coverage. However, we may be able to find a substitute medication at lower cost or fill out paperwork to get insurance to cover a needed medication.   If a prior authorization is required to get your medication covered by your insurance company, please allow us 1-2 business days to complete this process.  Drug prices often vary depending on where the prescription is filled and some pharmacies may offer cheaper prices.  The website www.goodrx.com contains coupons for medications through different pharmacies. The prices here do not account for what the cost may be with help from insurance (it may be cheaper with your insurance), but the website can give you the price if you did not use any insurance.  - You can print the associated coupon and take it with   your prescription to the pharmacy.  - You may also stop by our office during regular business hours and pick up a GoodRx coupon card.  - If you need your prescription sent electronically to a different pharmacy, notify our office through [place] [facility] MyChart or by phone at [phone] option 4.     Si Usted Necesita Algo Despus de Su Visita  Tambin puede enviarnos un mensaje a travs de MyChart. Por lo general respondemos a los mensajes de MyChart en el transcurso de 1 a 2  das hbiles.  Para renovar recetas, por favor pida a su farmacia que se ponga en contacto con nuestra oficina. Nuestro nmero de fax es el [phone].  Si tiene un asunto urgente cuando la clnica est cerrada y que no puede esperar hasta el siguiente da hbil, puede llamar/localizar a su doctor(a) al nmero que aparece a continuacin.   Por favor, tenga en cuenta que aunque hacemos todo lo posible para estar disponibles para asuntos urgentes fuera del horario de oficina, no estamos disponibles las 24 horas del da, los 7 das de la semana.   Si tiene un problema urgente y no puede comunicarse con nosotros, puede optar por buscar atencin mdica  en el consultorio de su doctor(a), en una clnica privada, en un centro de atencin urgente o en una sala de emergencias.  Si tiene una emergencia mdica, por favor llame inmediatamente al 911 o vaya a la sala de emergencias.  Nmeros de bper  - Dr. Kowalski: [phone]  - Dra. Moye: [phone]  - Dra. Stewart: [phone]  En caso de inclemencias del tiempo, por favor llame a nuestra lnea principal al [phone] para una actualizacin sobre el estado de cualquier retraso o cierre.  Consejos para la medicacin en dermatologa: Por favor, guarde las cajas en las que vienen los medicamentos de uso tpico para ayudarle a seguir las instrucciones sobre dnde y cmo usarlos. Las farmacias generalmente imprimen las instrucciones del medicamento slo en las cajas y no directamente en los tubos del medicamento.   Si su medicamento es muy caro, por favor, pngase en contacto con nuestra oficina llamando al [phone] y presione la opcin 4 o envenos un mensaje a travs de MyChart.   No podemos decirle cul ser su copago por los medicamentos por adelantado ya que esto es diferente dependiendo de la cobertura de su seguro. Sin embargo, es posible que podamos encontrar un medicamento sustituto a menor costo o llenar un formulario para que el  seguro cubra el medicamento que se considera necesario.   Si se requiere una autorizacin previa para que su compaa de seguros cubra su medicamento, por favor permtanos de 1 a 2 das hbiles para completar este proceso.  Los precios de los medicamentos varan con frecuencia dependiendo del lugar de dnde se surte la receta y alguna farmacias pueden ofrecer precios ms baratos.  El sitio web www.goodrx.com tiene cupones para medicamentos de diferentes farmacias. Los precios aqu no tienen en cuenta lo que podra costar con la ayuda del seguro (puede ser ms barato con su seguro), pero el sitio web puede darle el precio si no utiliz ningn seguro.  - Puede imprimir el cupn correspondiente y llevarlo con su receta a la farmacia.  - Tambin puede pasar por nuestra oficina durante el horario de atencin regular y recoger una tarjeta de cupones de GoodRx.  - Si necesita que su receta se enve electrnicamente a una farmacia diferente, informe a nuestra oficina a travs de MyChart de [place]   o por telfono llamando al [phone] y presione la opcin 4.  

## 2022-01-03 NOTE — Progress Notes (Signed)
   Follow-Up Visit   Subjective  Jessica Haas is a 71 y.o. female who presents for the following: Facial Elastosis (Patient is here today for facial fillers).   The following portions of the chart were reviewed this encounter and updated as appropriate:   Tobacco  Allergies  Meds  Problems  Med Hx  Surg Hx  Fam Hx      Review of Systems:  No other skin or systemic complaints except as noted in HPI or Assessment and Plan.  Objective  Well appearing patient in no apparent distress; mood and affect are within normal limits.  A focused examination was performed including the face. Relevant physical exam findings are noted in the Assessment and Plan.  Face Rhytides and volume loss.                                    Assessment & Plan  Elastosis of skin Face  Voluma XC 2cc injected into the B/L midface as marked.  Restylane Defyne 1 cc injected into the lower face and nasolabial fold areas as marked.   Plan Restylane Refyne to the lips, perioral upper cutaneous lip, and forehead scar at follow up appointment. Patient will need to come 15 minutes prior to appointment to apply numbing cream.   Recommend compounded vitamin K cream or arnica cream to help improve bruise healing.   Discussed hyaluronidase to dissolve older filler under the eyes to possibly help with swelling and bluish discoloration but patient has bluish discoloration at baseline so this may or may not improve discoloration. Pt defers at this time as she is concerned about area appearing hollow without the filler present.  Plan on adding 4 units of Botox to the chin area to decrease "pebbling" at her next Botox appointment.    Filling material injection - Face Prior to the procedure, the patient's past medical history, allergies and the rare but potential risks and complications were reviewed with the patient and a signed consent was obtained. Pre and post-treatment care was  discussed and instructions provided.  Risks including vascular occlusion were discussed.   Location: See attached photo  Filler Type: Restylane Defyne and Juvederm Voluma  Restylane Defyne Lot number: [id number] Expiration date: 07/21/2022  Reuel Boom Lot number: [id number] Expiration date: 05/30/2022  Procedure: The area was prepped thoroughly with Puracyn. After introducing the needle into the desired treatment area, the syringe plunger was drawn back to ensure there was no flash of blood prior to injecting the filler in order to minimize risk of intravascular injection and vascular occlusion. Insertion sites were prepped with puracyn. Prior to injecting filler at the desired location, the syringe plunger was drawn back to ensure there was no flash of blood in order to minimize risk of intravascular injection and vascular occlusion.     Patient tolerated the procedure well. The patient will call with any problems, questions or concerns prior to their next appointment.     Return for Restylane Refyne to lips, perioral upper cutaneous, and forehead scar.  Luther Redo, CMA, am acting as scribe for Forest Gleason, MD .  Documentation: I have reviewed the above documentation for accuracy and completeness, and I agree with the above.  Forest Gleason, MD

## 2022-01-07 ENCOUNTER — Telehealth: Payer: Self-pay

## 2022-01-07 NOTE — Telephone Encounter (Signed)
This patient called and LVM on the nurse line that she is having issues in the tear trough area where she had the fillers injected last Thursday. Patient is requesting a call back from Dr. Laurence Ferrari to discuss this.

## 2022-01-08 NOTE — Telephone Encounter (Signed)
Spoke with patient. We actually did tear trough filler last December for her and she still notices some blue discoloration and puffiness under the eyes. On comparison photos she does have continued improvement in the transition lower eyelid festoon to cheek but may have a little persistent edema. Discussed possibility of dissolving restylane Refyne and treating (at least 48 hours later) with restylane eyelight when available. Also could consider Skintyte for skin laxity in this area.   She is interested in trying to lift the lateral brow. Discussed botox brow lift, which she is interested in doing this week.  Abby, could you add her to the schedule for 1:45 PM tomorrow for brow botox? I already gave her the time.  Thank you!

## 2022-01-08 NOTE — Telephone Encounter (Signed)
Appointment scheduled.

## 2022-01-09 ENCOUNTER — Ambulatory Visit (INDEPENDENT_AMBULATORY_CARE_PROVIDER_SITE_OTHER): Payer: Self-pay | Admitting: Dermatology

## 2022-01-09 ENCOUNTER — Encounter: Payer: Self-pay | Admitting: Dermatology

## 2022-01-09 DIAGNOSIS — L988 Other specified disorders of the skin and subcutaneous tissue: Secondary | ICD-10-CM

## 2022-01-09 NOTE — Progress Notes (Signed)
   Follow-Up Visit   Subjective  Jessica Haas is a 71 y.o. female who presents for the following: Facial Elastosis (Here for brow lift Botox).   The following portions of the chart were reviewed this encounter and updated as appropriate:  Tobacco  Allergies  Meds  Problems  Med Hx  Surg Hx  Fam Hx      Review of Systems: No other skin or systemic complaints except as noted in HPI or Assessment and Plan.   Objective  Well appearing patient in no apparent distress; mood and affect are within normal limits.  A focused examination was performed including face. Relevant physical exam findings are noted in the Assessment and Plan.  forehead Rhytides and volume loss.       Assessment & Plan  Elastosis of skin forehead  Botox injected as noted below: Brow lift 4 units  Recommend 3 more units in an additional row top of forehead at next Botox appointment. Also consider chin botox at follow-up.  May dissolve Restylane under eyes once we have Restylane Eyelight available  KSA for lip and forehead scar filler.  Botox Injection - forehead Location: See attached image  Informed consent: Discussed risks (infection, pain, bleeding, bruising, swelling, allergic reaction, paralysis of nearby muscles, eyelid droop, double vision, neck weakness, difficulty breathing, headache, undesirable cosmetic result, and need for additional treatment) and benefits of the procedure, as well as the alternatives.  Informed consent was obtained.  Preparation: The area was cleansed with alcohol.  Procedure Details:  Botox was injected into the dermis with a 30-gauge needle. Pressure applied to any bleeding. Ice packs offered for swelling.  Lot Number:  [id number] Expiration:  02/2024  Total Units Injected:  4  Plan: Patient was instructed to remain upright for 4 hours. Patient was instructed to avoid massaging the face and avoid vigorous exercise for the rest of the day. Tylenol may be used  for headache.  Allow 2 weeks before returning to clinic for additional dosing as needed. Patient will call for any problems.    Return for Fillers as scheduled.  I, Emelia Salisbury, CMA, am acting as scribe for Forest Gleason, MD.  Documentation: I have reviewed the above documentation for accuracy and completeness, and I agree with the above.  Forest Gleason, MD

## 2022-01-09 NOTE — Patient Instructions (Signed)
Due to recent changes in healthcare laws, you may see results of your pathology and/or laboratory studies on MyChart before the doctors have had a chance to review them. We understand that in some cases there may be results that are confusing or concerning to you. Please understand that not all results are received at the same time and often the doctors may need to interpret multiple results in order to provide you with the best plan of care or course of treatment. Therefore, we ask that you please give us 2 business days to thoroughly review all your results before contacting the office for clarification. Should we see a critical lab result, you will be contacted sooner.   If You Need Anything After Your Visit  If you have any questions or concerns for your doctor, please call our main line at [phone] and press option 4 to reach your doctor's medical assistant. If no one answers, please leave a voicemail as directed and we will return your call as soon as possible. Messages left after 4 pm will be answered the following business day.   You may also send us a message via MyChart. We typically respond to MyChart messages within 1-2 business days.  For prescription refills, please ask your pharmacy to contact our office. Our fax number is [phone].  If you have an urgent issue when the clinic is closed that cannot wait until the next business day, you can page your doctor at the number below.    Please note that while we do our best to be available for urgent issues outside of office hours, we are not available 24/7.   If you have an urgent issue and are unable to reach us, you may choose to seek medical care at your doctor's office, retail clinic, urgent care center, or emergency room.  If you have a medical emergency, please immediately call 911 or go to the emergency department.  Pager Numbers  - Dr. Kowalski: [phone]  - Dr. Moye: [phone]  - Dr. Stewart:  [phone]  In the event of inclement weather, please call our main line at [phone] for an update on the status of any delays or closures.  Dermatology Medication Tips: Please keep the boxes that topical medications come in in order to help keep track of the instructions about where and how to use these. Pharmacies typically print the medication instructions only on the boxes and not directly on the medication tubes.   If your medication is too expensive, please contact our office at [phone] option 4 or send us a message through MyChart.   We are unable to tell what your co-pay for medications will be in advance as this is different depending on your insurance coverage. However, we may be able to find a substitute medication at lower cost or fill out paperwork to get insurance to cover a needed medication.   If a prior authorization is required to get your medication covered by your insurance company, please allow us 1-2 business days to complete this process.  Drug prices often vary depending on where the prescription is filled and some pharmacies may offer cheaper prices.  The website www.goodrx.com contains coupons for medications through different pharmacies. The prices here do not account for what the cost may be with help from insurance (it may be cheaper with your insurance), but the website can give you the price if you did not use any insurance.  - You can print the associated coupon and take it with   your prescription to the pharmacy.  - You may also stop by our office during regular business hours and pick up a GoodRx coupon card.  - If you need your prescription sent electronically to a different pharmacy, notify our office through [facility] MyChart or by phone at [phone] option 4.     Si Usted Necesita Algo Despus de Su Visita  Tambin puede enviarnos un mensaje a travs de MyChart. Por lo general respondemos a los mensajes de MyChart en el transcurso de 1 a 2  das hbiles.  Para renovar recetas, por favor pida a su farmacia que se ponga en contacto con nuestra oficina. Nuestro nmero de fax es el [phone].  Si tiene un asunto urgente cuando la clnica est cerrada y que no puede esperar hasta el siguiente da hbil, puede llamar/localizar a su doctor(a) al nmero que aparece a continuacin.   Por favor, tenga en cuenta que aunque hacemos todo lo posible para estar disponibles para asuntos urgentes fuera del horario de oficina, no estamos disponibles las 24 horas del da, los 7 das de la semana.   Si tiene un problema urgente y no puede comunicarse con nosotros, puede optar por buscar atencin mdica  en el consultorio de su doctor(a), en una clnica privada, en un centro de atencin urgente o en una sala de emergencias.  Si tiene una emergencia mdica, por favor llame inmediatamente al 911 o vaya a la sala de emergencias.  Nmeros de bper  - Dr. Kowalski: [phone]  - Dra. Moye: [phone]  - Dra. Stewart: [phone]  En caso de inclemencias del tiempo, por favor llame a nuestra lnea principal al [phone] para una actualizacin sobre el estado de cualquier retraso o cierre.  Consejos para la medicacin en dermatologa: Por favor, guarde las cajas en las que vienen los medicamentos de uso tpico para ayudarle a seguir las instrucciones sobre dnde y cmo usarlos. Las farmacias generalmente imprimen las instrucciones del medicamento slo en las cajas y no directamente en los tubos del medicamento.   Si su medicamento es muy caro, por favor, pngase en contacto con nuestra oficina llamando al [phone] y presione la opcin 4 o envenos un mensaje a travs de MyChart.   No podemos decirle cul ser su copago por los medicamentos por adelantado ya que esto es diferente dependiendo de la cobertura de su seguro. Sin embargo, es posible que podamos encontrar un medicamento sustituto a menor costo o llenar un formulario para que el  seguro cubra el medicamento que se considera necesario.   Si se requiere una autorizacin previa para que su compaa de seguros cubra su medicamento, por favor permtanos de 1 a 2 das hbiles para completar este proceso.  Los precios de los medicamentos varan con frecuencia dependiendo del lugar de dnde se surte la receta y alguna farmacias pueden ofrecer precios ms baratos.  El sitio web www.goodrx.com tiene cupones para medicamentos de diferentes farmacias. Los precios aqu no tienen en cuenta lo que podra costar con la ayuda del seguro (puede ser ms barato con su seguro), pero el sitio web puede darle el precio si no utiliz ningn seguro.  - Puede imprimir el cupn correspondiente y llevarlo con su receta a la farmacia.  - Tambin puede pasar por nuestra oficina durante el horario de atencin regular y recoger una tarjeta de cupones de GoodRx.  - Si necesita que su receta se enve electrnicamente a una farmacia diferente, informe a nuestra oficina a travs de MyChart de [facility]   o por telfono llamando al [phone] y presione la opcin 4.  

## 2022-01-11 ENCOUNTER — Encounter: Payer: Self-pay | Admitting: Dermatology

## 2022-01-12 ENCOUNTER — Encounter: Payer: Self-pay | Admitting: Dermatology

## 2022-01-16 ENCOUNTER — Ambulatory Visit: Payer: Medicare Other | Admitting: Dermatology

## 2022-01-17 ENCOUNTER — Other Ambulatory Visit: Payer: Self-pay | Admitting: Dermatology

## 2022-01-31 ENCOUNTER — Ambulatory Visit: Payer: Medicare Other | Admitting: Dermatology

## 2022-02-07 ENCOUNTER — Ambulatory Visit (INDEPENDENT_AMBULATORY_CARE_PROVIDER_SITE_OTHER): Payer: Self-pay | Admitting: Dermatology

## 2022-02-07 ENCOUNTER — Encounter: Payer: Self-pay | Admitting: Dermatology

## 2022-02-07 DIAGNOSIS — L988 Other specified disorders of the skin and subcutaneous tissue: Secondary | ICD-10-CM

## 2022-02-07 NOTE — Progress Notes (Signed)
   Follow-Up Visit   Subjective  Jessica Haas is a 71 y.o. female who presents for the following: Facial Elastosis (Here for filler at lips, perioral and scar on forehead).    The following portions of the chart were reviewed this encounter and updated as appropriate:  Tobacco  Allergies  Meds  Problems  Med Hx  Surg Hx  Fam Hx      Review of Systems: No other skin or systemic complaints except as noted in HPI or Assessment and Plan.   Objective  Well appearing patient in no apparent distress; mood and affect are within normal limits.  A focused examination was performed including face. Relevant physical exam findings are noted in the Assessment and Plan.  Lips Rhytides and volume loss.                               Assessment & Plan  Elastosis of skin Lips  Restylane Refyne injected as noted below.  Recommend Restylane Contour for mid face filler.  Filling material injection - Lips Prior to the procedure, the patient's past medical history, allergies and the rare but potential risks and complications were reviewed with the patient and a signed consent was obtained. Pre and post-treatment care was discussed and instructions provided.  Location: lips, perioral, scar at forehead and chin  Filler Type: Restylane refyne Lot: [id number] Exp: 10/20/2022  Procedure: Lidocaine-tetracaine ointment was applied to treatment areas to achieve good local anesthesia. The area was prepped thoroughly with Puracyn. After introducing the needle into the desired treatment area, the syringe plunger was drawn back to ensure there was no flash of blood prior to injecting the filler in order to minimize risk of intravascular injection and vascular occlusion. After injection of the filler, the treated areas were cleansed and iced to reduce swelling. Post-treatment instructions were reviewed with the patient.       Patient tolerated the procedure well. The patient will  call with any problems, questions or concerns prior to their next appointment.    Return for Botox as scheduled.  I, Emelia Salisbury, CMA, am acting as scribe for Forest Gleason, MD.   Documentation: I have reviewed the above documentation for accuracy and completeness, and I agree with the above.  Forest Gleason, MD

## 2022-02-07 NOTE — Patient Instructions (Signed)
Due to recent changes in healthcare laws, you may see results of your pathology and/or laboratory studies on MyChart before the doctors have had a chance to review them. We understand that in some cases there may be results that are confusing or concerning to you. Please understand that not all results are received at the same time and often the doctors may need to interpret multiple results in order to provide you with the best plan of care or course of treatment. Therefore, we ask that you please give us 2 business days to thoroughly review all your results before contacting the office for clarification. Should we see a critical lab result, you will be contacted sooner.   If You Need Anything After Your Visit  If you have any questions or concerns for your doctor, please call our main line at [phone] and press option 4 to reach your doctor's medical assistant. If no one answers, please leave a voicemail as directed and we will return your call as soon as possible. Messages left after 4 pm will be answered the following business day.   You may also send us a message via MyChart. We typically respond to MyChart messages within 1-2 business days.  For prescription refills, please ask your pharmacy to contact our office. Our fax number is [phone].  If you have an urgent issue when the clinic is closed that cannot wait until the next business day, you can page your doctor at the number below.    Please note that while we do our best to be available for urgent issues outside of office hours, we are not available 24/7.   If you have an urgent issue and are unable to reach us, you may choose to seek medical care at your doctor's office, retail clinic, urgent care center, or emergency room.  If you have a medical emergency, please immediately call 911 or go to the emergency department.  Pager Numbers  - Dr. Kowalski: [phone]  - Dr. Moye: [phone]  - Dr. Stewart:  [phone]  In the event of inclement weather, please call our main line at [phone] for an update on the status of any delays or closures.  Dermatology Medication Tips: Please keep the boxes that topical medications come in in order to help keep track of the instructions about where and how to use these. Pharmacies typically print the medication instructions only on the boxes and not directly on the medication tubes.   If your medication is too expensive, please contact our office at [phone] option 4 or send us a message through MyChart.   We are unable to tell what your co-pay for medications will be in advance as this is different depending on your insurance coverage. However, we may be able to find a substitute medication at lower cost or fill out paperwork to get insurance to cover a needed medication.   If a prior authorization is required to get your medication covered by your insurance company, please allow us 1-2 business days to complete this process.  Drug prices often vary depending on where the prescription is filled and some pharmacies may offer cheaper prices.  The website www.goodrx.com contains coupons for medications through different pharmacies. The prices here do not account for what the cost may be with help from insurance (it may be cheaper with your insurance), but the website can give you the price if you did not use any insurance.  - You can print the associated coupon and take it with   your prescription to the pharmacy.  - You may also stop by our office during regular business hours and pick up a GoodRx coupon card.  - If you need your prescription sent electronically to a different pharmacy, notify our office through [facility] MyChart or by phone at [phone] option 4.     Si Usted Necesita Algo Despus de Su Visita  Tambin puede enviarnos un mensaje a travs de MyChart. Por lo general respondemos a los mensajes de MyChart en el transcurso de 1 a 2  das hbiles.  Para renovar recetas, por favor pida a su farmacia que se ponga en contacto con nuestra oficina. Nuestro nmero de fax es el [phone].  Si tiene un asunto urgente cuando la clnica est cerrada y que no puede esperar hasta el siguiente da hbil, puede llamar/localizar a su doctor(a) al nmero que aparece a continuacin.   Por favor, tenga en cuenta que aunque hacemos todo lo posible para estar disponibles para asuntos urgentes fuera del horario de oficina, no estamos disponibles las 24 horas del da, los 7 das de la semana.   Si tiene un problema urgente y no puede comunicarse con nosotros, puede optar por buscar atencin mdica  en el consultorio de su doctor(a), en una clnica privada, en un centro de atencin urgente o en una sala de emergencias.  Si tiene una emergencia mdica, por favor llame inmediatamente al 911 o vaya a la sala de emergencias.  Nmeros de bper  - Dr. Kowalski: [phone]  - Dra. Moye: [phone]  - Dra. Stewart: [phone]  En caso de inclemencias del tiempo, por favor llame a nuestra lnea principal al [phone] para una actualizacin sobre el estado de cualquier retraso o cierre.  Consejos para la medicacin en dermatologa: Por favor, guarde las cajas en las que vienen los medicamentos de uso tpico para ayudarle a seguir las instrucciones sobre dnde y cmo usarlos. Las farmacias generalmente imprimen las instrucciones del medicamento slo en las cajas y no directamente en los tubos del medicamento.   Si su medicamento es muy caro, por favor, pngase en contacto con nuestra oficina llamando al [phone] y presione la opcin 4 o envenos un mensaje a travs de MyChart.   No podemos decirle cul ser su copago por los medicamentos por adelantado ya que esto es diferente dependiendo de la cobertura de su seguro. Sin embargo, es posible que podamos encontrar un medicamento sustituto a menor costo o llenar un formulario para que el  seguro cubra el medicamento que se considera necesario.   Si se requiere una autorizacin previa para que su compaa de seguros cubra su medicamento, por favor permtanos de 1 a 2 das hbiles para completar este proceso.  Los precios de los medicamentos varan con frecuencia dependiendo del lugar de dnde se surte la receta y alguna farmacias pueden ofrecer precios ms baratos.  El sitio web www.goodrx.com tiene cupones para medicamentos de diferentes farmacias. Los precios aqu no tienen en cuenta lo que podra costar con la ayuda del seguro (puede ser ms barato con su seguro), pero el sitio web puede darle el precio si no utiliz ningn seguro.  - Puede imprimir el cupn correspondiente y llevarlo con su receta a la farmacia.  - Tambin puede pasar por nuestra oficina durante el horario de atencin regular y recoger una tarjeta de cupones de GoodRx.  - Si necesita que su receta se enve electrnicamente a una farmacia diferente, informe a nuestra oficina a travs de MyChart de    o por telfono llamando al [phone] y presione la opcin 4.  

## 2022-02-14 ENCOUNTER — Encounter: Payer: Self-pay | Admitting: Dermatology

## 2022-02-26 ENCOUNTER — Ambulatory Visit: Payer: Medicare Other | Admitting: Dermatology

## 2022-02-27 ENCOUNTER — Ambulatory Visit: Payer: Medicare Other | Admitting: Dermatology

## 2022-03-05 ENCOUNTER — Ambulatory Visit (INDEPENDENT_AMBULATORY_CARE_PROVIDER_SITE_OTHER): Payer: Self-pay | Admitting: Dermatology

## 2022-03-05 ENCOUNTER — Telehealth: Payer: Self-pay

## 2022-03-05 ENCOUNTER — Encounter: Payer: Self-pay | Admitting: Dermatology

## 2022-03-05 DIAGNOSIS — L988 Other specified disorders of the skin and subcutaneous tissue: Secondary | ICD-10-CM

## 2022-03-05 NOTE — Telephone Encounter (Signed)
Patient was in office today for Botox injections and was under charged by 2.5 units. I called patient to let her know and she will either come by the office or pay the difference of $32.50 at her next appointment. Lurlean Horns., RMA

## 2022-03-05 NOTE — Patient Instructions (Signed)
Due to recent changes in healthcare laws, you may see results of your pathology and/or laboratory studies on MyChart before the doctors have had a chance to review them. We understand that in some cases there may be results that are confusing or concerning to you. Please understand that not all results are received at the same time and often the doctors may need to interpret multiple results in order to provide you with the best plan of care or course of treatment. Therefore, we ask that you please give us 2 business days to thoroughly review all your results before contacting the office for clarification. Should we see a critical lab result, you will be contacted sooner.   If You Need Anything After Your Visit  If you have any questions or concerns for your doctor, please call our main line at [phone] and press option 4 to reach your doctor's medical assistant. If no one answers, please leave a voicemail as directed and we will return your call as soon as possible. Messages left after 4 pm will be answered the following business day.   You may also send us a message via MyChart. We typically respond to MyChart messages within 1-2 business days.  For prescription refills, please ask your pharmacy to contact our office. Our fax number is [phone].  If you have an urgent issue when the clinic is closed that cannot wait until the next business day, you can page your doctor at the number below.    Please note that while we do our best to be available for urgent issues outside of office hours, we are not available 24/7.   If you have an urgent issue and are unable to reach us, you may choose to seek medical care at your doctor's office, retail clinic, urgent care center, or emergency room.  If you have a medical emergency, please immediately call 911 or go to the emergency department.  Pager Numbers  - Dr. Kowalski: [phone]  - Dr. Moye: [phone]  - Dr. Stewart:  [phone]  In the event of inclement weather, please call our main line at [phone] for an update on the status of any delays or closures.  Dermatology Medication Tips: Please keep the boxes that topical medications come in in order to help keep track of the instructions about where and how to use these. Pharmacies typically print the medication instructions only on the boxes and not directly on the medication tubes.   If your medication is too expensive, please contact our office at [phone] option 4 or send us a message through MyChart.   We are unable to tell what your co-pay for medications will be in advance as this is different depending on your insurance coverage. However, we may be able to find a substitute medication at lower cost or fill out paperwork to get insurance to cover a needed medication.   If a prior authorization is required to get your medication covered by your insurance company, please allow us 1-2 business days to complete this process.  Drug prices often vary depending on where the prescription is filled and some pharmacies may offer cheaper prices.  The website www.goodrx.com contains coupons for medications through different pharmacies. The prices here do not account for what the cost may be with help from insurance (it may be cheaper with your insurance), but the website can give you the price if you did not use any insurance.  - You can print the associated coupon and take it with   your prescription to the pharmacy.  - You may also stop by our office during regular business hours and pick up a GoodRx coupon card.  - If you need your prescription sent electronically to a different pharmacy, notify our office through [facility] MyChart or by phone at [phone] option 4.     Si Usted Necesita Algo Despus de Su Visita  Tambin puede enviarnos un mensaje a travs de MyChart. Por lo general respondemos a los mensajes de MyChart en el transcurso de 1 a 2  das hbiles.  Para renovar recetas, por favor pida a su farmacia que se ponga en contacto con nuestra oficina. Nuestro nmero de fax es el [phone].  Si tiene un asunto urgente cuando la clnica est cerrada y que no puede esperar hasta el siguiente da hbil, puede llamar/localizar a su doctor(a) al nmero que aparece a continuacin.   Por favor, tenga en cuenta que aunque hacemos todo lo posible para estar disponibles para asuntos urgentes fuera del horario de oficina, no estamos disponibles las 24 horas del da, los 7 das de la semana.   Si tiene un problema urgente y no puede comunicarse con nosotros, puede optar por buscar atencin mdica  en el consultorio de su doctor(a), en una clnica privada, en un centro de atencin urgente o en una sala de emergencias.  Si tiene una emergencia mdica, por favor llame inmediatamente al 911 o vaya a la sala de emergencias.  Nmeros de bper  - Dr. Kowalski: [phone]  - Dra. Moye: [phone]  - Dra. Stewart: [phone]  En caso de inclemencias del tiempo, por favor llame a nuestra lnea principal al [phone] para una actualizacin sobre el estado de cualquier retraso o cierre.  Consejos para la medicacin en dermatologa: Por favor, guarde las cajas en las que vienen los medicamentos de uso tpico para ayudarle a seguir las instrucciones sobre dnde y cmo usarlos. Las farmacias generalmente imprimen las instrucciones del medicamento slo en las cajas y no directamente en los tubos del medicamento.   Si su medicamento es muy caro, por favor, pngase en contacto con nuestra oficina llamando al [phone] y presione la opcin 4 o envenos un mensaje a travs de MyChart.   No podemos decirle cul ser su copago por los medicamentos por adelantado ya que esto es diferente dependiendo de la cobertura de su seguro. Sin embargo, es posible que podamos encontrar un medicamento sustituto a menor costo o llenar un formulario para que el  seguro cubra el medicamento que se considera necesario.   Si se requiere una autorizacin previa para que su compaa de seguros cubra su medicamento, por favor permtanos de 1 a 2 das hbiles para completar este proceso.  Los precios de los medicamentos varan con frecuencia dependiendo del lugar de dnde se surte la receta y alguna farmacias pueden ofrecer precios ms baratos.  El sitio web www.goodrx.com tiene cupones para medicamentos de diferentes farmacias. Los precios aqu no tienen en cuenta lo que podra costar con la ayuda del seguro (puede ser ms barato con su seguro), pero el sitio web puede darle el precio si no utiliz ningn seguro.  - Puede imprimir el cupn correspondiente y llevarlo con su receta a la farmacia.  - Tambin puede pasar por nuestra oficina durante el horario de atencin regular y recoger una tarjeta de cupones de GoodRx.  - Si necesita que su receta se enve electrnicamente a una farmacia diferente, informe a nuestra oficina a travs de MyChart de [place]   o por telfono llamando al [phone] y presione la opcin 4.  

## 2022-03-05 NOTE — Progress Notes (Signed)
   Follow-Up Visit   Subjective  Jessica Haas is a 71 y.o. female who presents for the following: Facial Elastosis (Here for Botox).   The following portions of the chart were reviewed this encounter and updated as appropriate:  Tobacco  Allergies  Meds  Problems  Med Hx  Surg Hx  Fam Hx      Review of Systems: No other skin or systemic complaints except as noted in HPI or Assessment and Plan.   Objective  Well appearing patient in no apparent distress; mood and affect are within normal limits.  A focused examination was performed including face. Relevant physical exam findings are noted in the Assessment and Plan.  face Rhytides and volume loss.       Assessment & Plan  Elastosis of skin face  Botox 70 units injected as noted below  - Brow lift 4 units. - Frown complex 22.5 - Forehead 10 units - Crows feet 10 units x 2 - Upper lip 4 units - Lower lip 2 units - DAO's 2.5 x 2 units  Botox Injection - face Location: See attached image  Informed consent: Discussed risks (infection, pain, bleeding, bruising, swelling, allergic reaction, paralysis of nearby muscles, eyelid droop, double vision, neck weakness, difficulty breathing, headache, undesirable cosmetic result, and need for additional treatment) and benefits of the procedure, as well as the alternatives.  Informed consent was obtained.  Preparation: The area was cleansed with alcohol.  Procedure Details:  Botox was injected into the dermis with a 30-gauge needle. Pressure applied to any bleeding. Ice packs offered for swelling.  Lot Number:  [id number] Expiration:  05/2024  Total Units Injected:  70  Plan: Patient was instructed to remain upright for 4 hours. Patient was instructed to avoid massaging the face and avoid vigorous exercise for the rest of the day. Tylenol may be used for headache.  Allow 2 weeks before returning to clinic for additional dosing as needed. Patient will call for any  problems.    Return for Follow Up As Scheduled.  I, Emelia Salisbury, CMA, am acting as scribe for Forest Gleason, MD.  Documentation: I have reviewed the above documentation for accuracy and completeness, and I agree with the above.  Forest Gleason, MD

## 2022-03-20 ENCOUNTER — Ambulatory Visit (INDEPENDENT_AMBULATORY_CARE_PROVIDER_SITE_OTHER): Payer: Self-pay | Admitting: Dermatology

## 2022-03-20 ENCOUNTER — Encounter: Payer: Self-pay | Admitting: Dermatology

## 2022-03-20 DIAGNOSIS — L988 Other specified disorders of the skin and subcutaneous tissue: Secondary | ICD-10-CM

## 2022-03-20 NOTE — Patient Instructions (Addendum)
Due to recent changes in healthcare laws, you may see results of your pathology and/or laboratory studies on MyChart before the doctors have had a chance to review them. We understand that in some cases there may be results that are confusing or concerning to you. Please understand that not all results are received at the same time and often the doctors may need to interpret multiple results in order to provide you with the best plan of care or course of treatment. Therefore, we ask that you please give us 2 business days to thoroughly review all your results before contacting the office for clarification. Should we see a critical lab result, you will be contacted sooner.   If You Need Anything After Your Visit  If you have any questions or concerns for your doctor, please call our main line at [phone] and press option 4 to reach your doctor's medical assistant. If no one answers, please leave a voicemail as directed and we will return your call as soon as possible. Messages left after 4 pm will be answered the following business day.   You may also send us a message via MyChart. We typically respond to MyChart messages within 1-2 business days.  For prescription refills, please ask your pharmacy to contact our office. Our fax number is [phone].  If you have an urgent issue when the clinic is closed that cannot wait until the next business day, you can page your doctor at the number below.    Please note that while we do our best to be available for urgent issues outside of office hours, we are not available 24/7.   If you have an urgent issue and are unable to reach us, you may choose to seek medical care at your doctor's office, retail clinic, urgent care center, or emergency room.  If you have a medical emergency, please immediately call 911 or go to the emergency department.  Pager Numbers  - Dr. Kowalski: [phone]  - Dr. Moye: [phone]  - Dr. Stewart:  [phone]  In the event of inclement weather, please call our main line at [phone] for an update on the status of any delays or closures.  Dermatology Medication Tips: Please keep the boxes that topical medications come in in order to help keep track of the instructions about where and how to use these. Pharmacies typically print the medication instructions only on the boxes and not directly on the medication tubes.   If your medication is too expensive, please contact our office at [phone] option 4 or send us a message through MyChart.   We are unable to tell what your co-pay for medications will be in advance as this is different depending on your insurance coverage. However, we may be able to find a substitute medication at lower cost or fill out paperwork to get insurance to cover a needed medication.   If a prior authorization is required to get your medication covered by your insurance company, please allow us 1-2 business days to complete this process.  Drug prices often vary depending on where the prescription is filled and some pharmacies may offer cheaper prices.  The website www.goodrx.com contains coupons for medications through different pharmacies. The prices here do not account for what the cost may be with help from insurance (it may be cheaper with your insurance), but the website can give you the price if you did not use any insurance.  - You can print the associated coupon and take it with   your prescription to the pharmacy.  - You may also stop by our office during regular business hours and pick up a GoodRx coupon card.  - If you need your prescription sent electronically to a different pharmacy, notify our office through [facility] MyChart or by phone at [phone] option 4.     Si Usted Necesita Algo Despus de Su Visita  Tambin puede enviarnos un mensaje a travs de MyChart. Por lo general respondemos a los mensajes de MyChart en el transcurso de 1 a 2  das hbiles.  Para renovar recetas, por favor pida a su farmacia que se ponga en contacto con nuestra oficina. Nuestro nmero de fax es el [phone].  Si tiene un asunto urgente cuando la clnica est cerrada y que no puede esperar hasta el siguiente da hbil, puede llamar/localizar a su doctor(a) al nmero que aparece a continuacin.   Por favor, tenga en cuenta que aunque hacemos todo lo posible para estar disponibles para asuntos urgentes fuera del horario de oficina, no estamos disponibles las 24 horas del da, los 7 das de la semana.   Si tiene un problema urgente y no puede comunicarse con nosotros, puede optar por buscar atencin mdica  en el consultorio de su doctor(a), en una clnica privada, en un centro de atencin urgente o en una sala de emergencias.  Si tiene una emergencia mdica, por favor llame inmediatamente al 911 o vaya a la sala de emergencias.  Nmeros de bper  - Dr. Kowalski: [phone]  - Dra. Moye: [phone]  - Dra. Stewart: [phone]  En caso de inclemencias del tiempo, por favor llame a nuestra lnea principal al [phone] para una actualizacin sobre el estado de cualquier retraso o cierre.  Consejos para la medicacin en dermatologa: Por favor, guarde las cajas en las que vienen los medicamentos de uso tpico para ayudarle a seguir las instrucciones sobre dnde y cmo usarlos. Las farmacias generalmente imprimen las instrucciones del medicamento slo en las cajas y no directamente en los tubos del medicamento.   Si su medicamento es muy caro, por favor, pngase en contacto con nuestra oficina llamando al [phone] y presione la opcin 4 o envenos un mensaje a travs de MyChart.   No podemos decirle cul ser su copago por los medicamentos por adelantado ya que esto es diferente dependiendo de la cobertura de su seguro. Sin embargo, es posible que podamos encontrar un medicamento sustituto a menor costo o llenar un formulario para que el  seguro cubra el medicamento que se considera necesario.   Si se requiere una autorizacin previa para que su compaa de seguros cubra su medicamento, por favor permtanos de 1 a 2 das hbiles para completar este proceso.  Los precios de los medicamentos varan con frecuencia dependiendo del lugar de dnde se surte la receta y alguna farmacias pueden ofrecer precios ms baratos.  El sitio web www.goodrx.com tiene cupones para medicamentos de diferentes farmacias. Los precios aqu no tienen en cuenta lo que podra costar con la ayuda del seguro (puede ser ms barato con su seguro), pero el sitio web puede darle el precio si no utiliz ningn seguro.  - Puede imprimir el cupn correspondiente y llevarlo con su receta a la farmacia.  - Tambin puede pasar por nuestra oficina durante el horario de atencin regular y recoger una tarjeta de cupones de GoodRx.  - Si necesita que su receta se enve electrnicamente a una farmacia diferente, informe a nuestra oficina a travs de MyChart de [facility]   o por telfono llamando al [phone] y presione la opcin 4.      Due to recent changes in healthcare laws, you may see results of your pathology and/or laboratory studies on MyChart before the doctors have had a chance to review them. We understand that in some cases there may be results that are confusing or concerning to you. Please understand that not all results are received at the same time and often the doctors may need to interpret multiple results in order to provide you with the best plan of care or course of treatment. Therefore, we ask that you please give us 2 business days to thoroughly review all your results before contacting the office for clarification. Should we see a critical lab result, you will be contacted sooner.   If You Need Anything After Your Visit  If you have any questions or concerns for your doctor, please call our main line at [phone] and press option 4 to reach your  doctor's medical assistant. If no one answers, please leave a voicemail as directed and we will return your call as soon as possible. Messages left after 4 pm will be answered the following business day.   You may also send us a message via MyChart. We typically respond to MyChart messages within 1-2 business days.  For prescription refills, please ask your pharmacy to contact our office. Our fax number is [phone].  If you have an urgent issue when the clinic is closed that cannot wait until the next business day, you can page your doctor at the number below.    Please note that while we do our best to be available for urgent issues outside of office hours, we are not available 24/7.   If you have an urgent issue and are unable to reach us, you may choose to seek medical care at your doctor's office, retail clinic, urgent care center, or emergency room.  If you have a medical emergency, please immediately call 911 or go to the emergency department.  Pager Numbers  - Dr. Kowalski: [phone]  - Dr. Moye: [phone]  - Dr. Stewart: [phone]  In the event of inclement weather, please call our main line at [phone] for an update on the status of any delays or closures.  Dermatology Medication Tips: Please keep the boxes that topical medications come in in order to help keep track of the instructions about where and how to use these. Pharmacies typically print the medication instructions only on the boxes and not directly on the medication tubes.   If your medication is too expensive, please contact our office at [phone] option 4 or send us a message through MyChart.   We are unable to tell what your co-pay for medications will be in advance as this is different depending on your insurance coverage. However, we may be able to find a substitute medication at lower cost or fill out paperwork to get insurance to cover a needed medication.   If a prior authorization is  required to get your medication covered by your insurance company, please allow us 1-2 business days to complete this process.  Drug prices often vary depending on where the prescription is filled and some pharmacies may offer cheaper prices.  The website www.goodrx.com contains coupons for medications through different pharmacies. The prices here do not account for what the cost may be with help from insurance (it may be cheaper with your insurance), but the website can give you the price if you   did not use any insurance.  - You can print the associated coupon and take it with your prescription to the pharmacy.  - You may also stop by our office during regular business hours and pick up a GoodRx coupon card.  - If you need your prescription sent electronically to a different pharmacy, notify our office through [facility] MyChart or by phone at [phone] option 4.     Si Usted Necesita Algo Despus de Su Visita  Tambin puede enviarnos un mensaje a travs de MyChart. Por lo general respondemos a los mensajes de MyChart en el transcurso de 1 a 2 das hbiles.  Para renovar recetas, por favor pida a su farmacia que se ponga en contacto con nuestra oficina. Nuestro nmero de fax es el [phone].  Si tiene un asunto urgente cuando la clnica est cerrada y que no puede esperar hasta el siguiente da hbil, puede llamar/localizar a su doctor(a) al nmero que aparece a continuacin.   Por favor, tenga en cuenta que aunque hacemos todo lo posible para estar disponibles para asuntos urgentes fuera del horario de oficina, no estamos disponibles las 24 horas del da, los 7 das de la semana.   Si tiene un problema urgente y no puede comunicarse con nosotros, puede optar por buscar atencin mdica  en el consultorio de su doctor(a), en una clnica privada, en un centro de atencin urgente o en una sala de emergencias.  Si tiene una emergencia mdica, por favor llame inmediatamente al 911 o vaya a  la sala de emergencias.  Nmeros de bper  - Dr. Kowalski: [phone]  - Dra. Moye: [phone]  - Dra. Stewart: [phone]  En caso de inclemencias del tiempo, por favor llame a nuestra lnea principal al [phone] para una actualizacin sobre el estado de cualquier retraso o cierre.  Consejos para la medicacin en dermatologa: Por favor, guarde las cajas en las que vienen los medicamentos de uso tpico para ayudarle a seguir las instrucciones sobre dnde y cmo usarlos. Las farmacias generalmente imprimen las instrucciones del medicamento slo en las cajas y no directamente en los tubos del medicamento.   Si su medicamento es muy caro, por favor, pngase en contacto con nuestra oficina llamando al [phone] y presione la opcin 4 o envenos un mensaje a travs de MyChart.   No podemos decirle cul ser su copago por los medicamentos por adelantado ya que esto es diferente dependiendo de la cobertura de su seguro. Sin embargo, es posible que podamos encontrar un medicamento sustituto a menor costo o llenar un formulario para que el seguro cubra el medicamento que se considera necesario.   Si se requiere una autorizacin previa para que su compaa de seguros cubra su medicamento, por favor permtanos de 1 a 2 das hbiles para completar este proceso.  Los precios de los medicamentos varan con frecuencia dependiendo del lugar de dnde se surte la receta y alguna farmacias pueden ofrecer precios ms baratos.  El sitio web www.goodrx.com tiene cupones para medicamentos de diferentes farmacias. Los precios aqu no tienen en cuenta lo que podra costar con la ayuda del seguro (puede ser ms barato con su seguro), pero el sitio web puede darle el precio si no utiliz ningn seguro.  - Puede imprimir el cupn correspondiente y llevarlo con su receta a la farmacia.  - Tambin puede pasar por nuestra oficina durante el horario de atencin regular y recoger una tarjeta de cupones de  GoodRx.  - Si necesita que su receta se   enve electrnicamente a una farmacia diferente, informe a nuestra oficina a travs de MyChart de [facility] o por telfono llamando al [phone] y presione la opcin 4.  

## 2022-03-20 NOTE — Progress Notes (Signed)
   Follow-Up Visit   Subjective  Jessica Haas is a 71 y.o. female who presents for the following: Facial Elastosis (Elastosis of skin ).  The following portions of the chart were reviewed this encounter and updated as appropriate:  Tobacco  Allergies  Meds  Problems  Med Hx  Surg Hx  Fam Hx      Review of Systems: No other skin or systemic complaints except as noted in HPI or Assessment and Plan.   Objective  Well appearing patient in no apparent distress; mood and affect are within normal limits.  A focused examination was performed including face. Relevant physical exam findings are noted in the Assessment and Plan.  Head - Anterior (Face) Rhytides and volume loss.                         Assessment & Plan  Elastosis of skin Head - Anterior (Face)  Voluma XC 2 syringes at lateral mid-face with cannula approach  Botox 3 units at forehead as marked  Botox 4 units at upper lip for lip flip    Filling material injection - Head - Anterior (Face) Location: See attached image  Informed consent: Discussed risks (infection, pain, bleeding, bruising, swelling, allergic reaction, paralysis of nearby muscles, eyelid droop, double vision, neck weakness, difficulty breathing, headache, undesirable cosmetic result, and need for additional treatment) and benefits of the procedure, as well as the alternatives.  Informed consent was obtained.  Preparation: The area was cleansed with alcohol.  Procedure Details:  Botox was injected into the dermis with a 30-gauge needle. Pressure applied to any bleeding. Ice packs offered for swelling.  Lot Number:  [id number] Expiration:  05/2024  Total Units Injected:  7  Plan: Tylenol may be used for headache.  Allow 2 weeks before returning to clinic for additional dosing as needed. Patient will call for any problems.   Filling material injection - Head - Anterior (Face) Prior to the procedure, the patient's past  medical history, allergies and the rare but potential risks and complications were reviewed with the patient and a signed consent was obtained. Pre and post-treatment care was discussed and instructions provided.  Location: cheeks  Filler Type: Juvederm Voluma  Procedure: The area was prepped thoroughly with Puracyn. After introducing the needle into the desired treatment area, the syringe plunger was drawn back to ensure there was no flash of blood prior to injecting the filler in order to minimize risk of intravascular injection and vascular occlusion. A cannula was used to inject the filler. Insertion sites were prepped with puracyn and lidocaine was injected to achieve good local anesthesia. A 25-gauge needle was used to create an opening at the insertion site and then the cannula was inserted using sterile technique. Prior to injecting filler at the desired location, the syringe plunger was drawn back to ensure there was no flash of blood in order to minimize risk of intravascular injection and vascular occlusion.     Patient tolerated the procedure well. The patient will call with any problems, questions or concerns prior to their next appointment.    Return for keep appointment as scheduled 06/12/2022. I, Ruthell Rummage, CMA, am acting as scribe for Forest Gleason, MD.  Documentation: I have reviewed the above documentation for accuracy and completeness, and I agree with the above.  Forest Gleason, MD

## 2022-04-24 ENCOUNTER — Other Ambulatory Visit: Payer: Self-pay | Admitting: Dermatology

## 2022-04-24 DIAGNOSIS — L988 Other specified disorders of the skin and subcutaneous tissue: Secondary | ICD-10-CM

## 2022-06-12 ENCOUNTER — Ambulatory Visit (INDEPENDENT_AMBULATORY_CARE_PROVIDER_SITE_OTHER): Payer: Self-pay | Admitting: Dermatology

## 2022-06-12 DIAGNOSIS — L988 Other specified disorders of the skin and subcutaneous tissue: Secondary | ICD-10-CM

## 2022-06-12 NOTE — Progress Notes (Signed)
   Follow-Up Visit   Subjective  Jessica Haas is a 72 y.o. female who presents for the following: Facial Elastosis (Patient here today for Botox injections. ).  The following portions of the chart were reviewed this encounter and updated as appropriate:   Tobacco  Allergies  Meds  Problems  Med Hx  Surg Hx  Fam Hx      Review of Systems:  No other skin or systemic complaints except as noted in HPI or Assessment and Plan.  Objective  Well appearing patient in no apparent distress; mood and affect are within normal limits.  A focused examination was performed including face. Relevant physical exam findings are noted in the Assessment and Plan.  face Rhytides and volume loss.        Assessment & Plan  Elastosis of skin face  Botox 70 units injected as noted below   - Brow lift 4 units. - Frown complex 22.5 - Forehead 10 units - Crows feet 10 units x 2 - Upper lip 6 units - DAO's 2.5 x 2 units   Discussed dissolving under eye filler, 2 weeks later injecting with Eyelight filler.   Filling material injection - face Location: See attached image  Informed consent: Discussed risks (infection, pain, bleeding, bruising, swelling, allergic reaction, paralysis of nearby muscles, eyelid droop, double vision, neck weakness, difficulty breathing, headache, undesirable cosmetic result, and need for additional treatment) and benefits of the procedure, as well as the alternatives.  Informed consent was obtained.  Preparation: The area was cleansed with alcohol.  Procedure Details:  Botox was injected into the dermis with a 30-gauge needle. Pressure applied to any bleeding. Ice packs offered for swelling.  Lot Number:  [id number] Expiration:  06/2024  Total Units Injected:  70  Plan: Tylenol may be used for headache.  Allow 2 weeks before returning to clinic for additional dosing as needed. Patient will call for any problems.   Related Medications ALTRENO 0.05 %  LOTN APPLY ONE APPLICATION TOPICALLY AT BEDTIME.   RTC for lip filler. Consider volbella to possibly last longer. Needs nursing appointment for numbing 1 hour before filler appt. Also discussed dissolving under eye filler and replacing with Restylane Eyelight.  Return in about 3 months (around 09/10/2022) for Botox.  Graciella Belton, RMA, am acting as scribe for Forest Gleason, MD .  Documentation: I have reviewed the above documentation for accuracy and completeness, and I agree with the above.  Forest Gleason, MD

## 2022-06-12 NOTE — Patient Instructions (Signed)
Due to recent changes in healthcare laws, you may see results of your pathology and/or laboratory studies on MyChart before the doctors have had a chance to review them. We understand that in some cases there may be results that are confusing or concerning to you. Please understand that not all results are received at the same time and often the doctors may need to interpret multiple results in order to provide you with the best plan of care or course of treatment. Therefore, we ask that you please give us 2 business days to thoroughly review all your results before contacting the office for clarification. Should we see a critical lab result, you will be contacted sooner.   If You Need Anything After Your Visit  If you have any questions or concerns for your doctor, please call our main line at [phone] and press option 4 to reach your doctor's medical assistant. If no one answers, please leave a voicemail as directed and we will return your call as soon as possible. Messages left after 4 pm will be answered the following business day.   You may also send us a message via MyChart. We typically respond to MyChart messages within 1-2 business days.  For prescription refills, please ask your pharmacy to contact our office. Our fax number is [phone].  If you have an urgent issue when the clinic is closed that cannot wait until the next business day, you can page your doctor at the number below.    Please note that while we do our best to be available for urgent issues outside of office hours, we are not available 24/7.   If you have an urgent issue and are unable to reach us, you may choose to seek medical care at your doctor's office, retail clinic, urgent care center, or emergency room.  If you have a medical emergency, please immediately call 911 or go to the emergency department.  Pager Numbers  - Dr. Kowalski: [phone]  - Dr. Moye: [phone]  - Dr. Stewart:  [phone]  In the event of inclement weather, please call our main line at [phone] for an update on the status of any delays or closures.  Dermatology Medication Tips: Please keep the boxes that topical medications come in in order to help keep track of the instructions about where and how to use these. Pharmacies typically print the medication instructions only on the boxes and not directly on the medication tubes.   If your medication is too expensive, please contact our office at [phone] option 4 or send us a message through MyChart.   We are unable to tell what your co-pay for medications will be in advance as this is different depending on your insurance coverage. However, we may be able to find a substitute medication at lower cost or fill out paperwork to get insurance to cover a needed medication.   If a prior authorization is required to get your medication covered by your insurance company, please allow us 1-2 business days to complete this process.  Drug prices often vary depending on where the prescription is filled and some pharmacies may offer cheaper prices.  The website www.goodrx.com contains coupons for medications through different pharmacies. The prices here do not account for what the cost may be with help from insurance (it may be cheaper with your insurance), but the website can give you the price if you did not use any insurance.  - You can print the associated coupon and take it with   your prescription to the pharmacy.  - You may also stop by our office during regular business hours and pick up a GoodRx coupon card.  - If you need your prescription sent electronically to a different pharmacy, notify our office through [place] MyChart or by phone at [phone] option 4.     Si Usted Necesita Algo Despus de Su Visita  Tambin puede enviarnos un mensaje a travs de MyChart. Por lo general respondemos a los mensajes de MyChart en el transcurso de 1 a 2  das hbiles.  Para renovar recetas, por favor pida a su farmacia que se ponga en contacto con nuestra oficina. Nuestro nmero de fax es el [phone].  Si tiene un asunto urgente cuando la clnica est cerrada y que no puede esperar hasta el siguiente da hbil, puede llamar/localizar a su doctor(a) al nmero que aparece a continuacin.   Por favor, tenga en cuenta que aunque hacemos todo lo posible para estar disponibles para asuntos urgentes fuera del horario de oficina, no estamos disponibles las 24 horas del da, los 7 das de la semana.   Si tiene un problema urgente y no puede comunicarse con nosotros, puede optar por buscar atencin mdica  en el consultorio de su doctor(a), en una clnica privada, en un centro de atencin urgente o en una sala de emergencias.  Si tiene una emergencia mdica, por favor llame inmediatamente al 911 o vaya a la sala de emergencias.  Nmeros de bper  - Dr. Kowalski: [phone]  - Dra. Moye: [phone]  - Dra. Stewart: [phone]  En caso de inclemencias del tiempo, por favor llame a nuestra lnea principal al [phone] para una actualizacin sobre el estado de cualquier retraso o cierre.  Consejos para la medicacin en dermatologa: Por favor, guarde las cajas en las que vienen los medicamentos de uso tpico para ayudarle a seguir las instrucciones sobre dnde y cmo usarlos. Las farmacias generalmente imprimen las instrucciones del medicamento slo en las cajas y no directamente en los tubos del medicamento.   Si su medicamento es muy caro, por favor, pngase en contacto con nuestra oficina llamando al [phone] y presione la opcin 4 o envenos un mensaje a travs de MyChart.   No podemos decirle cul ser su copago por los medicamentos por adelantado ya que esto es diferente dependiendo de la cobertura de su seguro. Sin embargo, es posible que podamos encontrar un medicamento sustituto a menor costo o llenar un formulario para que el  seguro cubra el medicamento que se considera necesario.   Si se requiere una autorizacin previa para que su compaa de seguros cubra su medicamento, por favor permtanos de 1 a 2 das hbiles para completar este proceso.  Los precios de los medicamentos varan con frecuencia dependiendo del lugar de dnde se surte la receta y alguna farmacias pueden ofrecer precios ms baratos.  El sitio web www.goodrx.com tiene cupones para medicamentos de diferentes farmacias. Los precios aqu no tienen en cuenta lo que podra costar con la ayuda del seguro (puede ser ms barato con su seguro), pero el sitio web puede darle el precio si no utiliz ningn seguro.  - Puede imprimir el cupn correspondiente y llevarlo con su receta a la farmacia.  - Tambin puede pasar por nuestra oficina durante el horario de atencin regular y recoger una tarjeta de cupones de GoodRx.  - Si necesita que su receta se enve electrnicamente a una farmacia diferente, informe a nuestra oficina a travs de MyChart de [place]   o por telfono llamando al [phone] y presione la opcin 4.  

## 2022-06-14 ENCOUNTER — Encounter: Payer: Self-pay | Admitting: Dermatology

## 2022-06-18 ENCOUNTER — Telehealth: Payer: Self-pay

## 2022-06-18 NOTE — Telephone Encounter (Signed)
LVM for patient letting her know I was calling to offer her a few appointment times for lip filler. As of right now there are 4 o'clock appts on 3/13, 3/20 and 3/28 as well as 3:40 pm on 3/27. Patient would need to actually come 45 minutes prior for numbing.  Lurlean Horns., RMA

## 2022-07-04 ENCOUNTER — Telehealth: Payer: Self-pay

## 2022-07-04 NOTE — Telephone Encounter (Signed)
Patient left voicemail on the nurse line requesting to speak to Dr. Laurence Ferrari or Kennyth Lose regarding the under eye filler. She mentioned it being dissolved and then replaced with something new. Patient called with questions.

## 2022-07-10 NOTE — Telephone Encounter (Signed)
Pt scheduled  

## 2022-07-25 ENCOUNTER — Ambulatory Visit: Payer: Medicare Other

## 2022-07-25 ENCOUNTER — Ambulatory Visit (INDEPENDENT_AMBULATORY_CARE_PROVIDER_SITE_OTHER): Payer: Medicare Other | Admitting: Dermatology

## 2022-07-25 VITALS — BP 122/81 | HR 98

## 2022-07-25 DIAGNOSIS — L570 Actinic keratosis: Secondary | ICD-10-CM | POA: Diagnosis not present

## 2022-07-25 DIAGNOSIS — B078 Other viral warts: Secondary | ICD-10-CM

## 2022-07-25 DIAGNOSIS — L988 Other specified disorders of the skin and subcutaneous tissue: Secondary | ICD-10-CM

## 2022-07-25 NOTE — Progress Notes (Signed)
   Follow-Up Visit   Subjective  Jessica Haas is a 72 y.o. female who presents for the following: filler for facial elastosis Patient mentioned concern about a spot on right 3rd finger she would like checked and a spot at left forearm.   The following portions of the chart were reviewed this encounter and updated as appropriate: medications, allergies, medical history  Review of Systems:  No other skin or systemic complaints except as noted in HPI or Assessment and Plan.  Objective  Well appearing patient in no apparent distress; mood and affect are within normal limits.  A focused examination was performed of the face. Relevant physical exam findings are noted in the Assessment and Plan or shown in photos.  Before photos             Injection map photo      Assessment & Plan    Facial Elastosis  Prior to the procedure, the patient's past medical history, allergies and the rare but potential risks and complications were reviewed with the patient and a signed consent was obtained. Pre and post-treatment care was discussed and instructions provided.  Discussed Refyne filler lasts 6-12 months  Location: lips and perioral   Filler Type: Restylane refyne  Procedure: Lidocaine-tetracaine ointment was applied to treatment areas to achieve good local anesthesia. The area was prepped thoroughly with Puracyn. After introducing the needle into the desired treatment area, the syringe plunger was drawn back to ensure there was no flash of blood prior to injecting the filler in order to minimize risk of intravascular injection and vascular occlusion. After injection of the filler, the treated areas were cleansed and iced to reduce swelling. Post-treatment instructions were reviewed with the patient.       Patient tolerated the procedure well. The patient will call with any problems, questions or concerns prior to their next appointment.  WART Exam: verrucous  papule(s)  Discussed viral / HPV (Human Papilloma Virus) etiology and risk of spread /infectivity to other areas of body as well as to other people.  Multiple treatments and methods may be required to clear warts and it is possible treatment may not be successful.  Treatment risks include discoloration; scarring and there is still potential for wart recurrence.  Treatment Plan: Destruction Procedure Note Destruction method: cryotherapy   Informed consent: discussed and consent obtained   Lesion destroyed using liquid nitrogen: Yes   Outcome: patient tolerated procedure well with no complications   Post-procedure details: wound care instructions given   Locations: right 3rd finger  # of Lesions Treated: 1   Prior to procedure, discussed risks of blister formation, small wound, skin dyspigmentation, or rare scar following cryotherapy. Recommend Vaseline ointment to treated areas while healing.  ACTINIC KERATOSIS VS EXCORIATION Left forearm Exam: Erythematous thin scaly papule Treatment Plan: Call for changes. Recheck at f/u.  Recommend daily broad spectrum sunscreen SPF 30+ to sun-exposed areas, reapply every 2 hours as needed.  Recommend staying in the shade or wearing long sleeves, sun glasses (UVA+UVB protection) and wide brim hats (4-inch brim around the entire circumference of the hat).   No follow-ups on file.  I, Asher Muir, CMA, am acting as scribe for Darden Dates, MD.   Documentation: I have reviewed the above documentation for accuracy and completeness, and I agree with the above.  Darden Dates, MD

## 2022-07-25 NOTE — Patient Instructions (Addendum)
Dr. Shanon Brow in [place]   Dr. Tamala Julian will start in July   Cryotherapy Aftercare  Wash gently with soap and water everyday.   Apply Vaseline and Band-Aid daily until healed.    Viral Wart (HPV) Counseling  Discussed viral / HPV (Human Papilloma Virus) etiology and risk of spread /infectivity to other areas of body as well as to other people.  Multiple treatments and methods may be required to clear warts and it is possible treatment may not be successful.  Treatment risks include discoloration; scarring and there is still potential for wart recurrence.      Due to recent changes in healthcare laws, you may see results of your pathology and/or laboratory studies on MyChart before the doctors have had a chance to review them. We understand that in some cases there may be results that are confusing or concerning to you. Please understand that not all results are received at the same time and often the doctors may need to interpret multiple results in order to provide you with the best plan of care or course of treatment. Therefore, we ask that you please give [place] 2 business days to thoroughly review all your results before contacting the office for clarification. Should we see a critical lab result, you will be contacted sooner.   If You Need Anything After Your Visit  If you have any questions or concerns for your doctor, please call our main line at [phone] and press option 4 to reach your doctor's medical assistant. If no one answers, please leave a voicemail as directed and we will return your call as soon as possible. Messages left after 4 pm will be answered the following business day.   You may also send [place] a message via [place]. We typically respond to MyChart messages within 1-2 business days.  For prescription refills, please ask your pharmacy to contact our office. Our fax number is [phone].  If you have an urgent issue when the clinic is closed that cannot wait until the  next business day, you can page your doctor at the number below.    Please note that while we do our best to be available for urgent issues outside of office hours, we are not available 24/7.   If you have an urgent issue and are unable to reach [place], you may choose to seek medical care at your doctor's office, retail clinic, urgent care center, or emergency room.  If you have a medical emergency, please immediately call 911 or go to the emergency department.  Pager Numbers  - Dr. Nehemiah Massed: [phone]  - Dr. Laurence Ferrari: [phone]  - Dr. Nicole Kindred: [phone]  In the event of inclement weather, please call our main line at [phone] for an update on the status of any delays or closures.  Dermatology Medication Tips: Please keep the boxes that topical medications come in in order to help keep track of the instructions about where and how to use these. Pharmacies typically print the medication instructions only on the boxes and not directly on the medication tubes.   If your medication is too expensive, please contact our office at [phone] option 4 or send [place] a message through [place].   We are unable to tell what your co-pay for medications will be in advance as this is different depending on your insurance coverage. However, we may be able to find a substitute medication at lower cost or fill out paperwork to get insurance to cover a needed medication.   If  a prior authorization is required to get your medication covered by your insurance company, please allow [place] 1-2 business days to complete this process.  Drug prices often vary depending on where the prescription is filled and some pharmacies may offer cheaper prices.  The website www.goodrx.com contains coupons for medications through different pharmacies. The prices here do not account for what the cost may be with help from insurance (it may be cheaper with your insurance), but the website can give you the price if you did not  use any insurance.  - You can print the associated coupon and take it with your prescription to the pharmacy.  - You may also stop by our office during regular business hours and pick up a GoodRx coupon card.  - If you need your prescription sent electronically to a different pharmacy, notify our office through [facility] or by phone at [phone] option 4.     Si Usted Necesita Algo Despus de Su Visita  Tambin puede enviarnos un mensaje a travs de Pharmacist, community. Por lo general respondemos a los mensajes de MyChart en el transcurso de 1 a 2 das hbiles.  Para renovar recetas, por favor pida a su farmacia que se ponga en contacto con nuestra oficina. Harland Dingwall de fax es [place] [phone].  Si tiene un asunto urgente cuando la clnica est cerrada y que no puede esperar hasta el siguiente da hbil, puede llamar/localizar a su doctor(a) al nmero que aparece a continuacin.   Por favor, tenga en cuenta que aunque hacemos todo lo posible para estar disponibles para asuntos urgentes fuera del horario de [place], no estamos disponibles las 24 horas del da, los 7 das de la [place].   Si tiene un problema urgente y no puede comunicarse con nosotros, puede optar por buscar atencin mdica  en el consultorio de su doctor(a), en una clnica privada, en un centro de atencin urgente o en una sala de emergencias.  Si tiene Engineering geologist, por favor llame inmediatamente al 911 o vaya a la sala de emergencias.  Nmeros de bper  - Dr. Nehemiah Massed: [phone]  - Dra. Moye: [phone]  - Dra. Nicole Kindred: [phone]  En caso de inclemencias del [place], por favor llame a Johnsie Kindred principal al [phone] para una actualizacin sobre el [place] de cualquier retraso o cierre.  Consejos para la medicacin en dermatologa: Por favor, guarde las cajas en las que vienen los medicamentos de uso tpico para ayudarle a seguir las instrucciones sobre dnde y cmo usarlos. Las farmacias  generalmente imprimen las instrucciones del medicamento slo en las cajas y no directamente en los tubos del [place].   Si su medicamento es muy caro, por favor, pngase en contacto con Zigmund Daniel llamando al [phone] y presione la opcin 4 o envenos un mensaje a travs de Pharmacist, community.   No podemos decirle cul ser su copago por los medicamentos por adelantado ya que esto es diferente dependiendo de la cobertura de su seguro. Sin embargo, es posible que podamos encontrar un medicamento sustituto a Electrical engineer un formulario para que el seguro cubra el medicamento que se considera necesario.   Si se requiere una autorizacin previa para que su compaa de seguros [place] su medicamento, por favor permtanos de 1 a 2 das hbiles para completar este proceso.  Los precios de los medicamentos varan con frecuencia dependiendo del Environmental consultant de dnde se surte la receta y alguna farmacias pueden ofrecer precios ms baratos.  El sitio web www.goodrx.com tiene  de diferentes farmacias. Los precios aqu no tienen en cuenta lo que podra costar con la ayuda del seguro (puede ser ms barato con su seguro), pero el sitio web puede darle el precio si no utiliz ningn seguro.  - Puede imprimir el cupn correspondiente y llevarlo con su receta a la farmacia.  - Tambin puede pasar por nuestra oficina durante el horario de atencin regular y recoger una tarjeta de cupones de GoodRx.  - Si necesita que su receta se enve electrnicamente a una farmacia diferente, informe a nuestra oficina a travs de MyChart de [place] o por telfono llamando al [phone] y presione la opcin 4.  

## 2022-08-12 ENCOUNTER — Inpatient Hospital Stay
Admission: RE | Admit: 2022-08-12 | Discharge: 2022-08-12 | Disposition: A | Discharge status: Home / Self Care | Payer: Self-pay | Source: Ambulatory Visit | Attending: Neurosurgery | Admitting: Neurosurgery

## 2022-08-12 ENCOUNTER — Other Ambulatory Visit: Payer: Self-pay

## 2022-08-12 DIAGNOSIS — Z049 Encounter for examination and observation for unspecified reason: Secondary | ICD-10-CM

## 2022-08-14 NOTE — Progress Notes (Unsigned)
Referring Physician:  Mariam Dollar, MD [address] [address] [place],  [place] [address]  Primary Physician:  Mariam Dollar, MD  History of Present Illness: 08/15/2022 Ms. Jessica Haas is here today with a chief complaint of substantial back pain that is been ongoing for many years.  Is been worsening over time.  Her worst pain in her legs is in her left leg which extends below her knee in her left anterolateral calf.  Her pain is made worse by twisting, lifting, bending, standing, sitting, and changing positions.  Her activities of daily living are making her pain worse.  She has been undergoing injections for some time.  This has helped her, but has not given her long-term relief.  She is able to do most things she needs to do, but has significant pain by the end of the day.  It can be as bad as 10 out of 10.   Bowel/Bladder Dysfunction: none  Conservative measures:  Physical therapy:  has not participated in within the past year Multimodal medical therapy including regular antiinflammatories:  tylenol, tizanidine, celebrex, diclofenac Injections:  has received epidural steroid injections 06/28/2022: Bilateral S1 transforaminal ESI ( less relief ) 03/26/2022: Bilateral S1 transforaminal ESI (good relief) 12/19/2021: Bilateral S1 transforaminal ESI (good relief) 05/07/2021: Bilateral S1 transforaminal ESI (good relief, postinjection flare x1 day) 01/02/2021: Left S1 transforaminal ESI (good relief, postinjection flare x1 day) 06/29/2020: Left S1 transforaminal ESI (moderate to good relief) 04/25/2020: Left S1 transforaminal ESI (moderate to good relief) 02/22/2020: Left S1 transforaminal ESI (moderate relief) 12/28/2019: Left S1 transforaminal ESI (moderate relief) 10/14/2019: Left S1 transforaminal ESI (moderate relief) 08/11/2019: Left S1 transforaminal ESI (good relief for 2 to 3 days, then very gradual return of symptoms) 06/22/2019: Left S1 transforaminal  ESI (20 to 30% relief, unable to cannulate the left L5-S1 foramen due to extensive degenerative changes)   Past Surgery:   Cervical Fusion in 2019 by Dr. Miguel Dibble has no symptoms of cervical myelopathy.  The symptoms are causing a significant impact on the patient's life.   I have utilized the care everywhere function in epic to review the outside records available from external health systems.  Review of Systems:  A 10 point review of systems is negative, except for the pertinent positives and negatives detailed in the HPI.  Past Medical History: Past Medical History:  Diagnosis Date   Actinic keratosis 11/29/2014   R ant lat neck - bx proven    Anemia 2008   required transfusion   Anxiety    Arthritis    back and hands   Basal cell carcinoma of hand    back, hx by Dr Orson Aloe in the 90s   Depression    GERD (gastroesophageal reflux disease)    H/O abnormal mammogram 2008   receiving annual MRIs, [facility]    H/O gastric ulcer 2008   High altitude sickness 2008   History of hiatal hernia    Neuromuscular disorder    numbness in left shin and top of foot from back issues   Skin cancer 03/19/2016   SCCis, Right calf   Skin cancer 09/15/2018   SCC. Right upper calf   Squamous cell carcinoma of skin 03/19/2016   R calf - SCCIS   Squamous cell carcinoma of skin 09/15/2018   R upper calf     Squamous cell carcinoma of skin 04/03/2021   L medial calf, [facility]   Swelling  FOOT   Varicose vein of leg     Past Surgical History: Past Surgical History:  Procedure Laterality Date   ABDOMINAL HYSTERECTOMY     BONE EXCISION Right 06/04/2018   Procedure: PARTIAL EXICSION BONE-PHALANX 5TH TOE;  Surgeon: Recardo Evangelist, DPM;  Location: [facility];  Service: Podiatry;  Laterality: Right;  LMA LOCAL   CARPAL TUNNEL RELEASE     [name]   CATARACT EXTRACTION W/PHACO Left 12/19/2015   Procedure: CATARACT EXTRACTION PHACO AND INTRAOCULAR LENS  PLACEMENT (IOC);  Surgeon: Galen Manila, MD;  Location: [facility];  Service: Ophthalmology;  Laterality: Left;  Lot# [id number] H [place]: 00"37.5 AP%:20.7 CDE: 7.76    CATARACT EXTRACTION W/PHACO Right 02/13/2016   Procedure: CATARACT EXTRACTION PHACO AND INTRAOCULAR LENS PLACEMENT (IOC);  Surgeon: Galen Manila, MD;  Location: [facility];  Service: Ophthalmology;  Laterality: Right;  Lot #[id number] H [place]: 32.5 AP%: 40.8 CDE: 7.05   CERVICAL FUSION     C5 - 7    FOOT FUSION Left    TRIPLE FUSION    Allergies: Allergies as of 08/15/2022 - Review Complete 06/14/2022  Allergen Reaction Noted   Doxycycline Diarrhea and Nausea Only 05/29/2011   Erythromycin Diarrhea and Nausea Only 05/29/2011   Lidocaine  06/13/2017   Losartan Diarrhea 03/21/2015   Rosuvastatin  08/01/2014   Tetracyclines & related  12/06/2015   Thimerosal (thiomersal)  12/06/2015    Medications:  Current Outpatient Medications:    acetaminophen (TYLENOL) 500 MG tablet, Take 500 mg by mouth 2 (two) times daily. Patient states she is taking everyday, Disp: , Rfl:    ALPRAZolam (XANAX) 0.25 MG tablet, Take 1 tablet (0.25 mg total) by mouth at bedtime as needed for sleep. (Patient taking differently: Take 0.25 mg by mouth at bedtime as needed for sleep. TAKES 1/2 AS NEEDED), Disp: 30 tablet, Rfl: 5   ALTRENO 0.05 % LOTN, APPLY ONE APPLICATION TOPICALLY AT BEDTIME., Disp: 45 g, Rfl: 2   celecoxib (CELEBREX) 200 MG capsule, Take 1 capsule (200 mg total) by mouth daily., Disp: 90 capsule, Rfl: 2   estradiol (VIVELLE-DOT) 0.025 MG/24HR, Place 0.025 patches onto the skin once a week., Disp: , Rfl:    FLUoxetine (PROZAC) 20 MG tablet, Take one by mouth 3 times a week. (Patient taking differently: Take 20 mg by mouth daily.), Disp: 36 tablet, Rfl: 3   hydrochlorothiazide (MICROZIDE) 12.5 MG capsule, Take 12.5 mg by mouth daily., Disp: , Rfl:    mupirocin ointment (BACTROBAN) 2 %, APPLY TOPICALLY TWICE A DAY, Disp: 22 g, Rfl: 1    Pitavastatin Calcium 1 MG TABS, Take 1 mg by mouth., Disp: , Rfl:    SOOLANTRA 1 % CREA, APPLY NIGHTLY, Disp: 45 g, Rfl: 6   tiZANidine (ZANAFLEX) 4 MG tablet, TAKE ONE TABLET EVERY 6 HOURS AS NEEDED FOR MUSCLE SPASMS, Disp: 60 tablet, Rfl: 2   traZODone (DESYREL) 50 MG tablet, Take 50 mg by mouth at bedtime., Disp: , Rfl:    zolpidem (AMBIEN CR) 12.5 MG CR tablet, TAKE 1 TABLET BY MOUTH AT BEDTIME AS NEEDED, Disp: 30 tablet, Rfl: 0  Social History: Social History   Tobacco Use   Smoking status: Former    Types: Cigarettes    Quit date: 05/29/1978    Years since quitting: 44.2   Smokeless tobacco: Former   Tobacco comments:    Quit 1980.  Substance Use Topics   Alcohol use: No   Drug use: No    Family Medical History: Family History  Problem Relation Age of Onset   Cancer Mother 52       bilateral breast , second age 56   COPD Father    Heart disease Father 72       died at 62    COPD Maternal Grandmother 66       pancreatic    Physical Examination: Vitals:   08/15/22 1312  BP: 110/76  Pulse: 98  SpO2: 97%    General: Patient is well developed, well nourished, calm, collected, and in no apparent distress. Attention to examination is appropriate.  Neck:   Supple.  Full range of motion.  Respiratory: Patient is breathing without any difficulty.   NEUROLOGICAL:     Awake, alert, oriented to person, place, and time.  Speech is clear and fluent.   Cranial Nerves: Pupils equal round and reactive to light.  Facial tone is symmetric.  Facial sensation is symmetric. Shoulder shrug is symmetric. Tongue protrusion is midline.  There is no pronator drift.  Her posture shows stooped forward as well as slight coronal plane bend towards the right.  Strength: Side Biceps Triceps Deltoid Interossei Grip Wrist Ext. Wrist Flex.  R L Side Iliopsoas Quads Hamstring PF DF EHL  R L Reflexes are 1+ and symmetric at the  biceps, triceps, brachioradialis, patella and achilles.   Hoffman's is absent.   Bilateral upper and lower extremity sensation is intact to light touch.    No evidence of dysmetria noted.  Gait is normal.     Medical Decision Making  Imaging: MRI L spine 03/07/2022 --T12-L1 = Mild narrowing of the spinal canal due to right paracentral  intervertebral disk bulge  --L1-L2 = Moderate narrowing of the spinal canal due to right facet  hypertrophy and right paracentral intervertebral disk bulge. Moderate  narrowing of the right L2 nerve root due to facet hypertrophy  --L2-L3 = Moderate narrowing of the spinal canal due to right facet  hypertrophy and right paracentral intervertebral disk bulge; severe  narrowing of the right L2 nerve root due to facet hypertrophy  --L3-L4 = Mild narrowing of the spinal canal due to right paracentral  intervertebral disk bulge; severe narrowing of the left L3 nerve root due  to facet hypertrophy  --L4-L5 = Severe facet hypertrophy and severe narrowing of the left L4  nerve root due to facet hypertrophy  --L5-S1 = Grade 1 anterolisthesis of L5 on S1 producing moderate bilateral  foraminal narrowing. Prominent facet degenerative disease.   IMPRESSION: Multiple bilateral sites of neuroforaminal narrowing and facet  hypertrophy related to levoscoliosis without substantial change from prior  study    Electronically Signed by:  Loreli Slot, MD, [facility]  Electronically Signed on:  03/07/2022 5:22 PM   Lumbar spine x-rays from January 28, 2022 shows coronal plane Cobb angle of 33 degrees from L1-L4.  Lumbar lordosis is 34 degrees approximately.  Lumbar spine x-rays from June 06, 2015 show coronal plane Cobb angle of 20 degrees and lumbar lordosis of 47 degrees approximately.  These measurements were made in similar fashion to the ones mentioned above.  I have personally reviewed the images and agree with the above interpretation.  Assessment  and Plan: Ms. Henken is a pleasant 72 y.o. female with low back pain causing sciatica of the left side, acquired scoliosis, and spondylolisthesis.  She is not having significant symptoms from this.  We spent a significant amount of time discussing potential treatment options for this.  As a for start, I recommended that she start with physical therapy.  We discussed a couple of different treatment options if that does not improve her symptoms.  First potential option would be consideration of spinal cord stimulation.  I will give her some information regarding this.  If she ultimately opts for a more significant intervention, that may necessitate a multilevel intervention to address her coronal and sagittal deformities.  These have both worsened over the past 6 years.  1 might consider L1-5 lateral lumbar interbody fusion with percutaneous fixation to try to address the curvature, but this may not address her neuroforaminal stenosis at L5-S1.  Before any consideration, she would need to have additional scans of her thoracic spine to ensure that that was an appropriate treatment strategy.  An alternative would be multilevel lateral lumbar interbody fusion followed by throughout the lumbar fusion.  I stressed to her that this was a very large intervention, and that she should only consider this if her lifestyle and activities of daily living were dramatically impacted by her current level of pain.  She expressed understanding of this.  I will see her back in clinic in approximately 8 weeks.  She will start physical therapy in the meantime.  I spent a total of 45 minutes in this patient's care today. This time was spent reviewing pertinent records including imaging studies, obtaining and confirming history, performing a directed evaluation, formulating and discussing my recommendations, and documenting the visit within the medical record.    Thank you for involving me in the care of this patient.       My Madariaga K. Myer Haff MD, [facility]

## 2022-08-15 ENCOUNTER — Encounter: Payer: Self-pay | Admitting: Neurosurgery

## 2022-08-15 ENCOUNTER — Ambulatory Visit: Payer: Medicare Other | Admitting: Neurosurgery

## 2022-08-15 VITALS — BP 110/76 | HR 98 | Ht 67.0 in | Wt 139.8 lb

## 2022-08-15 DIAGNOSIS — M5442 Lumbago with sciatica, left side: Secondary | ICD-10-CM

## 2022-08-15 DIAGNOSIS — G8929 Other chronic pain: Secondary | ICD-10-CM | POA: Diagnosis not present

## 2022-08-15 DIAGNOSIS — M4316 Spondylolisthesis, lumbar region: Secondary | ICD-10-CM

## 2022-08-15 DIAGNOSIS — M419 Scoliosis, unspecified: Secondary | ICD-10-CM | POA: Diagnosis not present

## 2022-08-29 ENCOUNTER — Ambulatory Visit: Payer: Medicare Other | Admitting: Dermatology

## 2022-09-11 ENCOUNTER — Ambulatory Visit (INDEPENDENT_AMBULATORY_CARE_PROVIDER_SITE_OTHER): Payer: Medicare Other | Admitting: Dermatology

## 2022-09-11 DIAGNOSIS — L570 Actinic keratosis: Secondary | ICD-10-CM

## 2022-09-11 DIAGNOSIS — L820 Inflamed seborrheic keratosis: Secondary | ICD-10-CM

## 2022-09-11 DIAGNOSIS — L988 Other specified disorders of the skin and subcutaneous tissue: Secondary | ICD-10-CM

## 2022-09-11 DIAGNOSIS — W908XXA Exposure to other nonionizing radiation, initial encounter: Secondary | ICD-10-CM | POA: Diagnosis not present

## 2022-09-11 NOTE — Progress Notes (Signed)
Follow-Up Visit   Subjective  Jessica Haas is a 72 y.o. female who presents for the following: under eye edema related to filler. Patient is here today to have under eye filler dissolved.  The following portions of the chart were reviewed this encounter and updated as appropriate: medications, allergies, medical history  Review of Systems:  No other skin or systemic complaints except as noted in HPI or Assessment and Plan.  Objective  Well appearing patient in no apparent distress; mood and affect are within normal limits.  A focused examination was performed of the following areas: the face   Relevant exam findings are noted in the Assessment and Plan.  R sup ankle x 1 Erythematous thin papules/macules with gritty scale.   R ankle x 1 Erythematous stuck-on, waxy papule or plaque    Assessment & Plan                      FACIAL ELASTOSIS Exam: Rhytides and volume loss.  Treatment Plan: Dissolved hyaluronic acid filler under the eyes today. Plan to use Restylane Eyelight filler at follow up appointment (No Charge, Training Case). Will also plan Nefertiti lift with Botox at no cost at follow up visit next week (No Charge, Training Case).  Hylenex recombinant 1 mL injected SQ into the R infra ocular and Hylenex recombinant 1 mL injected SQ into the L infra ocular.   Compression held with tape and gauze over the areas for 20 minutes, and areas massaged post procedure.   Lot #[id number] Expiration date: 08/2022 [id number]  Recommend daily broad spectrum sunscreen SPF 30+ to sun-exposed areas, reapply every 2 hours as needed. Call for new or changing lesions.  Staying in the shade or wearing long sleeves, sun glasses (UVA+UVB protection) and wide brim hats (4-inch brim around the entire circumference of the hat) are also recommended for sun protection.   No Charge.   AK (actinic keratosis) R sup ankle x 1  Actinic keratoses are precancerous spots  that appear secondary to cumulative UV radiation exposure/sun exposure over time. They are chronic with expected duration over 1 year. A portion of actinic keratoses will progress to squamous cell carcinoma of the skin. It is not possible to reliably predict which spots will progress to skin cancer and so treatment is recommended to prevent development of skin cancer.  Recommend daily broad spectrum sunscreen SPF 30+ to sun-exposed areas, reapply every 2 hours as needed.  Recommend staying in the shade or wearing long sleeves, sun glasses (UVA+UVB protection) and wide brim hats (4-inch brim around the entire circumference of the hat). Call for new or changing lesions.  Prior to procedure, discussed risks of blister formation, small wound, skin dyspigmentation, or rare scar following cryotherapy. Recommend Vaseline ointment to treated areas while healing.   Destruction of lesion - R sup ankle x 1 Complexity: simple   Destruction method: cryotherapy   Informed consent: discussed and consent obtained   Timeout:  patient name, date of birth, surgical site, and procedure verified Lesion destroyed using liquid nitrogen: Yes   Region frozen until ice ball extended beyond lesion: Yes   Outcome: patient tolerated procedure well with no complications   Post-procedure details: wound care instructions given    Inflamed seborrheic keratosis R ankle x 1  Symptomatic, irritating, patient would like treated.  Benign-appearing.  Call clinic for new or changing lesions.   Prior to procedure, discussed risks of blister formation, small wound, skin dyspigmentation, or rare scar  following treatment. Recommend Vaseline ointment to treated areas while healing.     Destruction of lesion - R ankle x 1 Complexity: simple   Destruction method: cryotherapy   Informed consent: discussed and consent obtained   Timeout:  patient name, date of birth, surgical site, and procedure verified Lesion destroyed using  liquid nitrogen: Yes   Region frozen until ice ball extended beyond lesion: Yes   Outcome: patient tolerated procedure well with no complications   Post-procedure details: wound care instructions given      Return for appointment as scheduled.  Maylene Roes, CMA, am acting as scribe for Darden Dates, MD .  Documentation: I have reviewed the above documentation for accuracy and completeness, and I agree with the above.  Darden Dates, MD

## 2022-09-11 NOTE — Patient Instructions (Signed)
Due to recent changes in healthcare laws, you may see results of your pathology and/or laboratory studies on MyChart before the doctors have had a chance to review them. We understand that in some cases there may be results that are confusing or concerning to you. Please understand that not all results are received at the same time and often the doctors may need to interpret multiple results in order to provide you with the best plan of care or course of treatment. Therefore, we ask that you please give us 2 business days to thoroughly review all your results before contacting the office for clarification. Should we see a critical lab result, you will be contacted sooner.   If You Need Anything After Your Visit  If you have any questions or concerns for your doctor, please call our main line at [phone] and press option 4 to reach your doctor's medical assistant. If no one answers, please leave a voicemail as directed and we will return your call as soon as possible. Messages left after 4 pm will be answered the following business day.   You may also send us a message via MyChart. We typically respond to MyChart messages within 1-2 business days.  For prescription refills, please ask your pharmacy to contact our office. Our fax number is [phone].  If you have an urgent issue when the clinic is closed that cannot wait until the next business day, you can page your doctor at the number below.    Please note that while we do our best to be available for urgent issues outside of office hours, we are not available 24/7.   If you have an urgent issue and are unable to reach us, you may choose to seek medical care at your doctor's office, retail clinic, urgent care center, or emergency room.  If you have a medical emergency, please immediately call 911 or go to the emergency department.  Pager Numbers  - Dr. Kowalski: [phone]  - Dr. Moye: [phone]  - Dr. Stewart:  [phone]  In the event of inclement weather, please call our main line at [phone] for an update on the status of any delays or closures.  Dermatology Medication Tips: Please keep the boxes that topical medications come in in order to help keep track of the instructions about where and how to use these. Pharmacies typically print the medication instructions only on the boxes and not directly on the medication tubes.   If your medication is too expensive, please contact our office at [phone] option 4 or send us a message through MyChart.   We are unable to tell what your co-pay for medications will be in advance as this is different depending on your insurance coverage. However, we may be able to find a substitute medication at lower cost or fill out paperwork to get insurance to cover a needed medication.   If a prior authorization is required to get your medication covered by your insurance company, please allow us 1-2 business days to complete this process.  Drug prices often vary depending on where the prescription is filled and some pharmacies may offer cheaper prices.  The website www.goodrx.com contains coupons for medications through different pharmacies. The prices here do not account for what the cost may be with help from insurance (it may be cheaper with your insurance), but the website can give you the price if you did not use any insurance.  - You can print the associated coupon and take it with   your prescription to the pharmacy.  - You may also stop by our office during regular business hours and pick up a GoodRx coupon card.  - If you need your prescription sent electronically to a different pharmacy, notify our office through [facility] MyChart or by phone at [phone] option 4.     Si Usted Necesita Algo Despus de Su Visita  Tambin puede enviarnos un mensaje a travs de MyChart. Por lo general respondemos a los mensajes de MyChart en el transcurso de 1 a 2  das hbiles.  Para renovar recetas, por favor pida a su farmacia que se ponga en contacto con nuestra oficina. Nuestro nmero de fax es el [phone].  Si tiene un asunto urgente cuando la clnica est cerrada y que no puede esperar hasta el siguiente da hbil, puede llamar/localizar a su doctor(a) al nmero que aparece a continuacin.   Por favor, tenga en cuenta que aunque hacemos todo lo posible para estar disponibles para asuntos urgentes fuera del horario de oficina, no estamos disponibles las 24 horas del da, los 7 das de la semana.   Si tiene un problema urgente y no puede comunicarse con nosotros, puede optar por buscar atencin mdica  en el consultorio de su doctor(a), en una clnica privada, en un centro de atencin urgente o en una sala de emergencias.  Si tiene una emergencia mdica, por favor llame inmediatamente al 911 o vaya a la sala de emergencias.  Nmeros de bper  - Dr. Kowalski: [phone]  - Dra. Moye: [phone]  - Dra. Stewart: [phone]  En caso de inclemencias del tiempo, por favor llame a nuestra lnea principal al [phone] para una actualizacin sobre el estado de cualquier retraso o cierre.  Consejos para la medicacin en dermatologa: Por favor, guarde las cajas en las que vienen los medicamentos de uso tpico para ayudarle a seguir las instrucciones sobre dnde y cmo usarlos. Las farmacias generalmente imprimen las instrucciones del medicamento slo en las cajas y no directamente en los tubos del medicamento.   Si su medicamento es muy caro, por favor, pngase en contacto con nuestra oficina llamando al [phone] y presione la opcin 4 o envenos un mensaje a travs de MyChart.   No podemos decirle cul ser su copago por los medicamentos por adelantado ya que esto es diferente dependiendo de la cobertura de su seguro. Sin embargo, es posible que podamos encontrar un medicamento sustituto a menor costo o llenar un formulario para que el  seguro cubra el medicamento que se considera necesario.   Si se requiere una autorizacin previa para que su compaa de seguros cubra su medicamento, por favor permtanos de 1 a 2 das hbiles para completar este proceso.  Los precios de los medicamentos varan con frecuencia dependiendo del lugar de dnde se surte la receta y alguna farmacias pueden ofrecer precios ms baratos.  El sitio web www.goodrx.com tiene cupones para medicamentos de diferentes farmacias. Los precios aqu no tienen en cuenta lo que podra costar con la ayuda del seguro (puede ser ms barato con su seguro), pero el sitio web puede darle el precio si no utiliz ningn seguro.  - Puede imprimir el cupn correspondiente y llevarlo con su receta a la farmacia.  - Tambin puede pasar por nuestra oficina durante el horario de atencin regular y recoger una tarjeta de cupones de GoodRx.  - Si necesita que su receta se enve electrnicamente a una farmacia diferente, informe a nuestra oficina a travs de MyChart de [place]   o por telfono llamando al [phone] y presione la opcin 4.  

## 2022-09-18 ENCOUNTER — Ambulatory Visit (INDEPENDENT_AMBULATORY_CARE_PROVIDER_SITE_OTHER): Payer: Self-pay | Admitting: Dermatology

## 2022-09-18 DIAGNOSIS — L988 Other specified disorders of the skin and subcutaneous tissue: Secondary | ICD-10-CM

## 2022-09-18 NOTE — Progress Notes (Signed)
Follow-Up Visit   Subjective  Jessica Haas is a 72 y.o. female who presents for the following: filler and Botox for facial elastosis (TRAINING CASE)  The following portions of the chart were reviewed this encounter and updated as appropriate: medications, allergies, medical history  Review of Systems:  No other skin or systemic complaints except as noted in HPI or Assessment and Plan.  Objective  Well appearing patient in no apparent distress; mood and affect are within normal limits.  A focused examination was performed of the face. Relevant physical exam findings are noted in the Assessment and Plan or shown in photos.                         Assessment & Plan    Facial Elastosis  Prior to the procedure, the patient's past medical history, allergies and the rare but potential risks and complications were reviewed with the patient and a signed consent was obtained. Pre and post-treatment care was discussed and instructions provided.   Location: [facility] Type: Restylane Eyelight  0.7 ml total  Lot number: [id number] Expiration date: 08/19/2024   Procedure: The area was prepped thoroughly with Puracyn. A cannula was used to inject the filler. Insertion sites were prepped with puracyn and lidocaine was injected to achieve good local anesthesia. A 23-gauge needle was used to create an opening at the insertion site and then the cannula was inserted using sterile technique. Prior to injecting filler at the desired location, the syringe plunger was drawn back to ensure there was no flash of blood in order to minimize risk of intravascular injection and vascular occlusion.     Cannula insertion sites were prepped with puracyn and lidocaine was injected to achieve good local anesthesia. A 23-gauge needle was used to create an opening at the insertion site and then the cannula was inserted using sterile technique. Prior to injecting filler at the desired  location, the syringe plunger was drawn back to ensure there was no flash of blood in order to minimize risk of intravascular injection and vascular occlusion.     Prior to the procedure, the patient's past medical history, allergies and the rare but potential risks and complications were reviewed with the patient and a signed consent was obtained. Pre and post-treatment care was discussed and instructions provided.  Patient tolerated the procedure well. The patient will call with any problems, questions or concerns prior to their next appointment.   Facial and neck skin elastosis - Nefertiti lift and platysma band treatment  Location: See attached image   Informed consent: Discussed risks (infection, pain, bleeding, bruising, swelling, allergic reaction, paralysis of nearby muscles, eyelid droop, double vision, neck weakness, difficulty breathing, headache, undesirable cosmetic result, and need for additional treatment) and benefits of the procedure, as well as the alternatives.  Informed consent was obtained.  Preparation: The area was cleansed with alcohol.  Procedure Details:  Botox was injected into the dermis with a 30-gauge needle. Pressure applied to any bleeding. Ice packs offered for swelling.  Lot Number:  [id number] Expiration:  07/2024  Total Units Injected:  34 (TRAINING CASE, NO CHARGE)  Plan: Tylenol may be used for headache.  Allow 2 weeks before returning to clinic for additional dosing as needed. Patient will call for any problems.  Return for appointment as scheduled.  Jessica Haas, CMA, am acting as scribe for Darden Dates, MD .  Documentation: I have reviewed the above documentation for  accuracy and completeness, and I agree with the above.  Darden Dates, MD

## 2022-09-18 NOTE — Patient Instructions (Signed)
Due to recent changes in healthcare laws, you may see results of your pathology and/or laboratory studies on MyChart before the doctors have had a chance to review them. We understand that in some cases there may be results that are confusing or concerning to you. Please understand that not all results are received at the same time and often the doctors may need to interpret multiple results in order to provide you with the best plan of care or course of treatment. Therefore, we ask that you please give us 2 business days to thoroughly review all your results before contacting the office for clarification. Should we see a critical lab result, you will be contacted sooner.   If You Need Anything After Your Visit  If you have any questions or concerns for your doctor, please call our main line at [phone] and press option 4 to reach your doctor's medical assistant. If no one answers, please leave a voicemail as directed and we will return your call as soon as possible. Messages left after 4 pm will be answered the following business day.   You may also send us a message via MyChart. We typically respond to MyChart messages within 1-2 business days.  For prescription refills, please ask your pharmacy to contact our office. Our fax number is [phone].  If you have an urgent issue when the clinic is closed that cannot wait until the next business day, you can page your doctor at the number below.    Please note that while we do our best to be available for urgent issues outside of office hours, we are not available 24/7.   If you have an urgent issue and are unable to reach us, you may choose to seek medical care at your doctor's office, retail clinic, urgent care center, or emergency room.  If you have a medical emergency, please immediately call 911 or go to the emergency department.  Pager Numbers  - Dr. Kowalski: [phone]  - Dr. Moye: [phone]  - Dr. Stewart:  [phone]  In the event of inclement weather, please call our main line at [phone] for an update on the status of any delays or closures.  Dermatology Medication Tips: Please keep the boxes that topical medications come in in order to help keep track of the instructions about where and how to use these. Pharmacies typically print the medication instructions only on the boxes and not directly on the medication tubes.   If your medication is too expensive, please contact our office at [phone] option 4 or send us a message through MyChart.   We are unable to tell what your co-pay for medications will be in advance as this is different depending on your insurance coverage. However, we may be able to find a substitute medication at lower cost or fill out paperwork to get insurance to cover a needed medication.   If a prior authorization is required to get your medication covered by your insurance company, please allow us 1-2 business days to complete this process.  Drug prices often vary depending on where the prescription is filled and some pharmacies may offer cheaper prices.  The website www.goodrx.com contains coupons for medications through different pharmacies. The prices here do not account for what the cost may be with help from insurance (it may be cheaper with your insurance), but the website can give you the price if you did not use any insurance.  - You can print the associated coupon and take it with   your prescription to the pharmacy.  - You may also stop by our office during regular business hours and pick up a GoodRx coupon card.  - If you need your prescription sent electronically to a different pharmacy, notify our office through [facility] MyChart or by phone at [phone] option 4.     Si Usted Necesita Algo Despus de Su Visita  Tambin puede enviarnos un mensaje a travs de MyChart. Por lo general respondemos a los mensajes de MyChart en el transcurso de 1 a 2  das hbiles.  Para renovar recetas, por favor pida a su farmacia que se ponga en contacto con nuestra oficina. Nuestro nmero de fax es el [phone].  Si tiene un asunto urgente cuando la clnica est cerrada y que no puede esperar hasta el siguiente da hbil, puede llamar/localizar a su doctor(a) al nmero que aparece a continuacin.   Por favor, tenga en cuenta que aunque hacemos todo lo posible para estar disponibles para asuntos urgentes fuera del horario de oficina, no estamos disponibles las 24 horas del da, los 7 das de la semana.   Si tiene un problema urgente y no puede comunicarse con nosotros, puede optar por buscar atencin mdica  en el consultorio de su doctor(a), en una clnica privada, en un centro de atencin urgente o en una sala de emergencias.  Si tiene una emergencia mdica, por favor llame inmediatamente al 911 o vaya a la sala de emergencias.  Nmeros de bper  - Dr. Kowalski: [phone]  - Dra. Moye: [phone]  - Dra. Stewart: [phone]  En caso de inclemencias del tiempo, por favor llame a nuestra lnea principal al [phone] para una actualizacin sobre el estado de cualquier retraso o cierre.  Consejos para la medicacin en dermatologa: Por favor, guarde las cajas en las que vienen los medicamentos de uso tpico para ayudarle a seguir las instrucciones sobre dnde y cmo usarlos. Las farmacias generalmente imprimen las instrucciones del medicamento slo en las cajas y no directamente en los tubos del medicamento.   Si su medicamento es muy caro, por favor, pngase en contacto con nuestra oficina llamando al [phone] y presione la opcin 4 o envenos un mensaje a travs de MyChart.   No podemos decirle cul ser su copago por los medicamentos por adelantado ya que esto es diferente dependiendo de la cobertura de su seguro. Sin embargo, es posible que podamos encontrar un medicamento sustituto a menor costo o llenar un formulario para que el  seguro cubra el medicamento que se considera necesario.   Si se requiere una autorizacin previa para que su compaa de seguros cubra su medicamento, por favor permtanos de 1 a 2 das hbiles para completar este proceso.  Los precios de los medicamentos varan con frecuencia dependiendo del lugar de dnde se surte la receta y alguna farmacias pueden ofrecer precios ms baratos.  El sitio web www.goodrx.com tiene cupones para medicamentos de diferentes farmacias. Los precios aqu no tienen en cuenta lo que podra costar con la ayuda del seguro (puede ser ms barato con su seguro), pero el sitio web puede darle el precio si no utiliz ningn seguro.  - Puede imprimir el cupn correspondiente y llevarlo con su receta a la farmacia.  - Tambin puede pasar por nuestra oficina durante el horario de atencin regular y recoger una tarjeta de cupones de GoodRx.  - Si necesita que su receta se enve electrnicamente a una farmacia diferente, informe a nuestra oficina a travs de MyChart de [facility]   o por telfono llamando al [phone] y presione la opcin 4.  

## 2022-10-02 ENCOUNTER — Ambulatory Visit: Payer: Medicare Other | Admitting: Dermatology

## 2022-10-07 ENCOUNTER — Ambulatory Visit: Payer: Medicare Other | Admitting: Dermatology

## 2022-10-07 VITALS — BP 112/76

## 2022-10-07 DIAGNOSIS — W908XXA Exposure to other nonionizing radiation, initial encounter: Secondary | ICD-10-CM

## 2022-10-07 DIAGNOSIS — L814 Other melanin hyperpigmentation: Secondary | ICD-10-CM

## 2022-10-07 DIAGNOSIS — L988 Other specified disorders of the skin and subcutaneous tissue: Secondary | ICD-10-CM

## 2022-10-07 DIAGNOSIS — Z7189 Other specified counseling: Secondary | ICD-10-CM

## 2022-10-07 DIAGNOSIS — L578 Other skin changes due to chronic exposure to nonionizing radiation: Secondary | ICD-10-CM | POA: Diagnosis not present

## 2022-10-07 DIAGNOSIS — L570 Actinic keratosis: Secondary | ICD-10-CM

## 2022-10-07 NOTE — Patient Instructions (Addendum)
Cryotherapy Aftercare  Wash gently with soap and water everyday.   Apply Vaseline and Band-Aid daily until healed.     Due to recent changes in healthcare laws, you may see results of your pathology and/or laboratory studies on MyChart before the doctors have had a chance to review them. We understand that in some cases there may be results that are confusing or concerning to you. Please understand that not all results are received at the same time and often the doctors may need to interpret multiple results in order to provide you with the best plan of care or course of treatment. Therefore, we ask that you please give us 2 business days to thoroughly review all your results before contacting the office for clarification. Should we see a critical lab result, you will be contacted sooner.   If You Need Anything After Your Visit  If you have any questions or concerns for your doctor, please call our main line at [phone] and press option 4 to reach your doctor's medical assistant. If no one answers, please leave a voicemail as directed and we will return your call as soon as possible. Messages left after 4 pm will be answered the following business day.   You may also send us a message via MyChart. We typically respond to MyChart messages within 1-2 business days.  For prescription refills, please ask your pharmacy to contact our office. Our fax number is [phone].  If you have an urgent issue when the clinic is closed that cannot wait until the next business day, you can page your doctor at the number below.    Please note that while we do our best to be available for urgent issues outside of office hours, we are not available 24/7.   If you have an urgent issue and are unable to reach us, you may choose to seek medical care at your doctor's office, retail clinic, urgent care center, or emergency room.  If you have a medical emergency, please immediately call 911 or go to the  emergency department.  Pager Numbers  - Dr. Kowalski: [phone]  - Dr. Moye: [phone]  - Dr. Stewart: [phone]  In the event of inclement weather, please call our main line at [phone] for an update on the status of any delays or closures.  Dermatology Medication Tips: Please keep the boxes that topical medications come in in order to help keep track of the instructions about where and how to use these. Pharmacies typically print the medication instructions only on the boxes and not directly on the medication tubes.   If your medication is too expensive, please contact our office at [phone] option 4 or send us a message through MyChart.   We are unable to tell what your co-pay for medications will be in advance as this is different depending on your insurance coverage. However, we may be able to find a substitute medication at lower cost or fill out paperwork to get insurance to cover a needed medication.   If a prior authorization is required to get your medication covered by your insurance company, please allow us 1-2 business days to complete this process.  Drug prices often vary depending on where the prescription is filled and some pharmacies may offer cheaper prices.  The website www.goodrx.com contains coupons for medications through different pharmacies. The prices here do not account for what the cost may be with help from insurance (it may be cheaper with your insurance), but the website can   give you the price if you did not use any insurance.  - You can print the associated coupon and take it with your prescription to the pharmacy.  - You may also stop by our office during regular business hours and pick up a GoodRx coupon card.  - If you need your prescription sent electronically to a different pharmacy, notify our office through  MyChart or by phone at [phone] option 4.     Si Usted Necesita Algo Despus de Su Visita  Tambin puede  enviarnos un mensaje a travs de MyChart. Por lo general respondemos a los mensajes de MyChart en el transcurso de 1 a 2 das hbiles.  Para renovar recetas, por favor pida a su farmacia que se ponga en contacto con nuestra oficina. Nuestro nmero de fax es el [phone].  Si tiene un asunto urgente cuando la clnica est cerrada y que no puede esperar hasta el siguiente da hbil, puede llamar/localizar a su doctor(a) al nmero que aparece a continuacin.   Por favor, tenga en cuenta que aunque hacemos todo lo posible para estar disponibles para asuntos urgentes fuera del horario de oficina, no estamos disponibles las 24 horas del da, los 7 das de la semana.   Si tiene un problema urgente y no puede comunicarse con nosotros, puede optar por buscar atencin mdica  en el consultorio de su doctor(a), en una clnica privada, en un centro de atencin urgente o en una sala de emergencias.  Si tiene una emergencia mdica, por favor llame inmediatamente al 911 o vaya a la sala de emergencias.  Nmeros de bper  - Dr. Kowalski: [phone]  - Dra. Moye: [phone]  - Dra. Stewart: [phone]  En caso de inclemencias del tiempo, por favor llame a nuestra lnea principal al [phone] para una actualizacin sobre el estado de cualquier retraso o cierre.  Consejos para la medicacin en dermatologa: Por favor, guarde las cajas en las que vienen los medicamentos de uso tpico para ayudarle a seguir las instrucciones sobre dnde y cmo usarlos. Las farmacias generalmente imprimen las instrucciones del medicamento slo en las cajas y no directamente en los tubos del medicamento.   Si su medicamento es muy caro, por favor, pngase en contacto con nuestra oficina llamando al [phone] y presione la opcin 4 o envenos un mensaje a travs de MyChart.   No podemos decirle cul ser su copago por los medicamentos por adelantado ya que esto es diferente dependiendo de la cobertura de su seguro.  Sin embargo, es posible que podamos encontrar un medicamento sustituto a menor costo o llenar un formulario para que el seguro cubra el medicamento que se considera necesario.   Si se requiere una autorizacin previa para que su compaa de seguros cubra su medicamento, por favor permtanos de 1 a 2 das hbiles para completar este proceso.  Los precios de los medicamentos varan con frecuencia dependiendo del lugar de dnde se surte la receta y alguna farmacias pueden ofrecer precios ms baratos.  El sitio web www.goodrx.com tiene cupones para medicamentos de diferentes farmacias. Los precios aqu no tienen en cuenta lo que podra costar con la ayuda del seguro (puede ser ms barato con su seguro), pero el sitio web puede darle el precio si no utiliz ningn seguro.  - Puede imprimir el cupn correspondiente y llevarlo con su receta a la farmacia.  - Tambin puede pasar por nuestra oficina durante el horario de atencin regular y recoger una tarjeta de cupones de GoodRx.  -   Si necesita que su receta se enve electrnicamente a una farmacia diferente, informe a nuestra oficina a travs de MyChart de [facility] o por telfono llamando al [phone] y presione la opcin 4.  

## 2022-10-07 NOTE — Progress Notes (Signed)
Follow-Up Visit   Subjective  Jessica Haas is a 72 y.o. female who presents for the following: Botox for facial elastosis  The following portions of the chart were reviewed this encounter and updated as appropriate: medications, allergies, medical history  Review of Systems:  No other skin or systemic complaints except as noted in HPI or Assessment and Plan.  Objective  Well appearing patient in no apparent distress; mood and affect are within normal limits.  A focused examination was performed of the face.  Relevant physical exam findings are noted in the Assessment and Plan.  Injection map photo   L glabella x 1, R forehead  x 3, L forehead x 1, L preauricular x 1 (6) Pink scaly macules    Assessment & Plan   AK (actinic keratosis) (6) L glabella x 1, R forehead  x 3, L forehead x 1, L preauricular x 1  Destruction of lesion - L glabella x 1, R forehead  x 3, L forehead x 1, L preauricular x 1  Destruction method: cryotherapy   Informed consent: discussed and consent obtained   Lesion destroyed using liquid nitrogen: Yes   Region frozen until ice ball extended beyond lesion: Yes   Outcome: patient tolerated procedure well with no complications   Post-procedure details: wound care instructions given   Additional details:  Prior to procedure, discussed risks of blister formation, small wound, skin dyspigmentation, or rare scar following cryotherapy. Recommend Vaseline ointment to treated areas while healing.    ACTINIC DAMAGE WITH PRECANCEROUS ACTINIC KERATOSES Counseling for Topical Chemotherapy Management: Patient exhibits: - Severe, confluent actinic changes with pre-cancerous actinic keratoses that is secondary to cumulative UV radiation exposure over time - Condition that is severe; chronic, not at goal. - diffuse scaly erythematous macules and papules with underlying dyspigmentation - Discussed Prescription "Field Treatment" topical Chemotherapy for Severe,  Chronic Confluent Actinic Changes with Pre-Cancerous Actinic Keratoses Field treatment involves treatment of an entire area of skin that has confluent Actinic Changes (Sun/ Ultraviolet light damage) and PreCancerous Actinic Keratoses by method of PhotoDynamic Therapy (PDT) and/or prescription Topical Chemotherapy agents such as 5-fluorouracil, 5-fluorouracil/calcipotriene, and/or imiquimod.  The purpose is to decrease the number of clinically evident and subclinical PreCancerous lesions to prevent progression to development of skin cancer by chemically destroying early precancer changes that may or may not be visible.  It has been shown to reduce the risk of developing skin cancer in the treated area. As a result of treatment, redness, scaling, crusting, and open sores may occur during treatment course. One or more than one of these methods may be used and may have to be used several times to control, suppress and eliminate the PreCancerous changes. Discussed treatment course, expected reaction, and possible side effects. - Recommend daily broad spectrum sunscreen SPF 30+ to sun-exposed areas, reapply every 2 hours as needed.  - Staying in the shade or wearing long sleeves, sun glasses (UVA+UVB protection) and wide brim hats (4-inch brim around the entire circumference of the hat) are also recommended. - Call for new or changing lesions.  - - Recommend red light PDT with debridement to face.  Patient may schedule.     Facial Elastosis  Botox 70 units injected as noted below   - Brow lift 4 units. - Frown complex 22.5 - Forehead 10 units - Crows feet 10 units x 2 - Upper lip 6 units - DAO's 2.5 x 2 units  Location: frown complex, forehead, brow lift, crows feet,upper  lip, DAO's  Informed consent: Discussed risks (infection, pain, bleeding, bruising, swelling, allergic reaction, paralysis of nearby muscles, eyelid droop, double vision, neck weakness, difficulty breathing, headache, undesirable  cosmetic result, and need for additional treatment) and benefits of the procedure, as well as the alternatives.  Informed consent was obtained.  Preparation: The area was cleansed with alcohol.  Procedure Details:  Botox was injected into the dermis with a 30-gauge needle. Pressure applied to any bleeding. Ice packs offered for swelling.  Lot Number:  [id number] Expiration:  05/26  Total Units Injected:  70  Plan: Tylenol may be used for headache.  Allow 2 weeks before returning to clinic for additional dosing as needed. Patient will call for any problems.   LENTIGINES Exam: scattered tan macules Due to sun exposure Treatment Plan: Recommend BBL Counseling for BBL / IPL / Laser and Coordination of Care Discussed the treatment option of Broad Band Light (BBL) /Intense Pulsed Light (IPL)/ Laser for skin discoloration, including brown spots and redness.  Typically we recommend at least 1-3 treatment sessions about 5-8 weeks apart for best results.  Cannot have tanned skin when BBL performed, and regular use of sunscreen is advised after the procedure to help maintain results. The patient's condition may also require "maintenance treatments" in the future.  The fee for BBL / laser treatments is $350 per treatment session for the whole face.  A fee can be quoted for other parts of the body.  Insurance typically does not pay for BBL/laser treatments and therefore the fee is an out-of-pocket cost.    Return for 3-75m Botox, July 15 for BBL.  I, [name], RMA, am acting as scribe for Willeen Niece, MD .   Documentation: I have reviewed the above documentation for accuracy and completeness, and I agree with the above.  Willeen Niece, MD

## 2022-10-09 ENCOUNTER — Ambulatory Visit: Payer: Medicare Other | Admitting: Dermatology

## 2022-10-16 ENCOUNTER — Ambulatory Visit: Payer: Medicare Other | Admitting: Dermatology

## 2022-10-22 ENCOUNTER — Ambulatory Visit: Payer: Medicare Other | Admitting: Dermatology

## 2022-10-29 ENCOUNTER — Ambulatory Visit: Payer: Medicare Other | Admitting: Neurosurgery

## 2022-10-29 ENCOUNTER — Encounter: Payer: Self-pay | Admitting: Neurosurgery

## 2022-10-29 VITALS — BP 115/72 | Ht 66.0 in | Wt 139.2 lb

## 2022-10-29 DIAGNOSIS — M4316 Spondylolisthesis, lumbar region: Secondary | ICD-10-CM

## 2022-10-29 DIAGNOSIS — G8929 Other chronic pain: Secondary | ICD-10-CM

## 2022-10-29 DIAGNOSIS — M5442 Lumbago with sciatica, left side: Secondary | ICD-10-CM

## 2022-10-29 DIAGNOSIS — G894 Chronic pain syndrome: Secondary | ICD-10-CM | POA: Diagnosis not present

## 2022-10-29 DIAGNOSIS — M419 Scoliosis, unspecified: Secondary | ICD-10-CM

## 2022-10-29 NOTE — Progress Notes (Signed)
Referring Physician:  Mariam Dollar, MD [address] [address] [place],  [place] [address]  Primary Physician:  Mariam Dollar, MD  History of Present Illness: 10/29/2022 She presents today with continued symptoms.  She has had some foot trouble since I last saw her.  She also has a wound on her knee which is healing.  Her other symptoms are unchanged.  08/15/2022 Jessica Haas is here today with a chief complaint of substantial back pain that is been ongoing for many years.  Is been worsening over time.  Her worst pain in her legs is in her left leg which extends below her knee in her left anterolateral calf.  Her pain is made worse by twisting, lifting, bending, standing, sitting, and changing positions.  Her activities of daily living are making her pain worse.  She has been undergoing injections for some time.  This has helped her, but has not given her long-term relief.  She is able to do most things she needs to do, but has significant pain by the end of the day.  It can be as bad as 10 out of 10.   Bowel/Bladder Dysfunction: none  Conservative measures:  Physical therapy:  has not participated in within the past year Multimodal medical therapy including regular antiinflammatories:  tylenol, tizanidine, celebrex, diclofenac Injections:  has received epidural steroid injections 06/28/2022: Bilateral S1 transforaminal ESI ( less relief ) 03/26/2022: Bilateral S1 transforaminal ESI (good relief) 12/19/2021: Bilateral S1 transforaminal ESI (good relief) 05/07/2021: Bilateral S1 transforaminal ESI (good relief, postinjection flare x1 day) 01/02/2021: Left S1 transforaminal ESI (good relief, postinjection flare x1 day) 06/29/2020: Left S1 transforaminal ESI (moderate to good relief) 04/25/2020: Left S1 transforaminal ESI (moderate to good relief) 02/22/2020: Left S1 transforaminal ESI (moderate relief) 12/28/2019: Left S1 transforaminal ESI (moderate  relief) 10/14/2019: Left S1 transforaminal ESI (moderate relief) 08/11/2019: Left S1 transforaminal ESI (good relief for 2 to 3 days, then very gradual return of symptoms) 06/22/2019: Left S1 transforaminal ESI (20 to 30% relief, unable to cannulate the left L5-S1 foramen due to extensive degenerative changes)   Past Surgery:   Cervical Fusion in 2019 by Dr. Miguel Dibble has no symptoms of cervical myelopathy.  The symptoms are causing a significant impact on the patient's life.   I have utilized the care everywhere function in epic to review the outside records available from external health systems.  Review of Systems:  A 10 point review of systems is negative, except for the pertinent positives and negatives detailed in the HPI.  Past Medical History: Past Medical History:  Diagnosis Date   Actinic keratosis 11/29/2014   R ant lat neck - bx proven    Anemia 2008   required transfusion   Anxiety    Arthritis    back and hands   Basal cell carcinoma of hand    back, hx by Dr Orson Aloe in the 90s   Depression    GERD (gastroesophageal reflux disease)    H/O abnormal mammogram 2008   receiving annual MRIs, [facility]    H/O gastric ulcer 2008   High altitude sickness 2008   History of hiatal hernia    Neuromuscular disorder (HCC)    numbness in left shin and top of foot from back issues   Skin cancer 03/19/2016   SCCis, Right calf   Skin cancer 09/15/2018   SCC. Right upper calf   Squamous cell carcinoma of skin 03/19/2016  R calf - SCCIS   Squamous cell carcinoma of skin 09/15/2018   R upper calf     Squamous cell carcinoma of skin 04/03/2021   L medial calf, [facility]   Swelling    FOOT   Varicose vein of leg     Past Surgical History: Past Surgical History:  Procedure Laterality Date   ABDOMINAL HYSTERECTOMY     BONE EXCISION Right 06/04/2018   Procedure: PARTIAL EXICSION BONE-PHALANX 5TH TOE;  Surgeon: Recardo Evangelist, DPM;  Location: [facility];  Service: Podiatry;  Laterality: Right;  LMA LOCAL   CARPAL TUNNEL RELEASE     [name]   CATARACT EXTRACTION W/PHACO Left 12/19/2015   Procedure: CATARACT EXTRACTION PHACO AND INTRAOCULAR LENS PLACEMENT (IOC);  Surgeon: Galen Manila, MD;  Location: [facility];  Service: Ophthalmology;  Laterality: Left;  Lot# [id number] H [place]: 00"37.5 AP%:20.7 CDE: 7.76    CATARACT EXTRACTION W/PHACO Right 02/13/2016   Procedure: CATARACT EXTRACTION PHACO AND INTRAOCULAR LENS PLACEMENT (IOC);  Surgeon: Galen Manila, MD;  Location: [facility];  Service: Ophthalmology;  Laterality: Right;  Lot #[id number] H [place]: 32.5 AP%: 40.8 CDE: 7.05   CERVICAL FUSION     C5 - 7    FOOT FUSION Left    TRIPLE FUSION    Allergies: Allergies as of 10/29/2022 - Review Complete 10/29/2022  Allergen Reaction Noted   Doxycycline Diarrhea and Nausea Only 05/29/2011   Erythromycin Diarrhea and Nausea Only 05/29/2011   Lidocaine  06/13/2017   Losartan Diarrhea 03/21/2015   Rosuvastatin  08/01/2014   Tetracyclines & related  12/06/2015   Thimerosal (thiomersal)  12/06/2015    Medications:  Current Outpatient Medications:    acetaminophen (TYLENOL) 500 MG tablet, Take 500 mg by mouth 2 (two) times daily. Patient states she is taking everyday, Disp: , Rfl:    ALPRAZolam (XANAX) 0.25 MG tablet, Take 1 tablet (0.25 mg total) by mouth at bedtime as needed for sleep. (Patient taking differently: Take 0.25 mg by mouth at bedtime as needed for sleep. TAKES 1/2 AS NEEDED), Disp: 30 tablet, Rfl: 5   ALTRENO 0.05 % LOTN, APPLY ONE APPLICATION TOPICALLY AT BEDTIME., Disp: 45 g, Rfl: 2   celecoxib (CELEBREX) 200 MG capsule, Take 1 capsule (200 mg total) by mouth daily., Disp: 90 capsule, Rfl: 2   FLUoxetine (PROZAC) 20 MG tablet, Take one by mouth 3 times a week. (Patient taking differently: Take 20 mg by mouth daily.), Disp: 36 tablet, Rfl: 3   hydrochlorothiazide (MICROZIDE) 12.5 MG capsule, Take 12.5 mg by mouth  daily., Disp: , Rfl:    Pitavastatin Calcium 1 MG TABS, Take 1 mg by mouth., Disp: , Rfl:    SOOLANTRA 1 % CREA, APPLY NIGHTLY, Disp: 45 g, Rfl: 6   tiZANidine (ZANAFLEX) 4 MG tablet, TAKE ONE TABLET EVERY 6 HOURS AS NEEDED FOR MUSCLE SPASMS, Disp: 60 tablet, Rfl: 2   traZODone (DESYREL) 50 MG tablet, Take 50 mg by mouth at bedtime., Disp: , Rfl:    zolpidem (AMBIEN CR) 12.5 MG CR tablet, TAKE 1 TABLET BY MOUTH AT BEDTIME AS NEEDED, Disp: 30 tablet, Rfl: 0   estradiol (VIVELLE-DOT) 0.025 MG/24HR, Place 0.025 patches onto the skin once a week., Disp: , Rfl:   Social History: Social History   Tobacco Use   Smoking status: Former    Types: Cigarettes    Quit date: 05/29/1978    Years since quitting: 44.4   Smokeless tobacco: Former   Tobacco comments:    Quit 1980.  Substance Use Topics   Alcohol use: No   Drug use: No    Family Medical History: Family History  Problem Relation Age of Onset   Cancer Mother 75       bilateral breast , second age 50   COPD Father    Heart disease Father 82       died at 21    COPD Maternal Grandmother 13       pancreatic    Physical Examination: Vitals:   10/29/22 1346  BP: 115/72    General: Patient is well developed, well nourished, calm, collected, and in no apparent distress. Attention to examination is appropriate.  Neck:   Supple.  Full range of motion.  Respiratory: Patient is breathing without any difficulty.   NEUROLOGICAL:     Awake, alert, oriented to person, place, and time.  Speech is clear and fluent.   Cranial Nerves: Pupils equal round and reactive to light.  Facial tone is symmetric.  Facial sensation is symmetric. Shoulder shrug is symmetric. Tongue protrusion is midline.  There is no pronator drift.  Her posture shows stooped forward as well as slight coronal plane bend towards the right.  Strength: Side Biceps Triceps Deltoid Interossei Grip Wrist Ext. Wrist Flex.  R 5 5 5 5 5 5 5   L 5 5 5 5 5 5 5    Side  Iliopsoas Quads Hamstring PF DF EHL  R 5 5 5 5 5 5   L 5 5 5 5 5 5    Reflexes are 1+ and symmetric at the biceps, triceps, brachioradialis, patella and achilles.   Hoffman's is absent.   Bilateral upper and lower extremity sensation is intact to light touch.    No evidence of dysmetria noted.  Gait is normal.     Medical Decision Making  Imaging: MRI L spine 03/07/2022 --T12-L1 = Mild narrowing of the spinal canal due to right paracentral  intervertebral disk bulge  --L1-L2 = Moderate narrowing of the spinal canal due to right facet  hypertrophy and right paracentral intervertebral disk bulge. Moderate  narrowing of the right L2 nerve root due to facet hypertrophy  --L2-L3 = Moderate narrowing of the spinal canal due to right facet  hypertrophy and right paracentral intervertebral disk bulge; severe  narrowing of the right L2 nerve root due to facet hypertrophy  --L3-L4 = Mild narrowing of the spinal canal due to right paracentral  intervertebral disk bulge; severe narrowing of the left L3 nerve root due  to facet hypertrophy  --L4-L5 = Severe facet hypertrophy and severe narrowing of the left L4  nerve root due to facet hypertrophy  --L5-S1 = Grade 1 anterolisthesis of L5 on S1 producing moderate bilateral  foraminal narrowing. Prominent facet degenerative disease.   IMPRESSION: Multiple bilateral sites of neuroforaminal narrowing and facet  hypertrophy related to levoscoliosis without substantial change from prior  study    Electronically Signed by:  Loreli Slot, MD, [facility]  Electronically Signed on:  03/07/2022 5:22 PM   Lumbar spine x-rays from January 28, 2022 shows coronal plane Cobb angle of 33 degrees from L1-L4.  Lumbar lordosis is 34 degrees approximately.  Lumbar spine x-rays from June 06, 2015 show coronal plane Cobb angle of 20 degrees and lumbar lordosis of 47 degrees approximately.  These measurements were made in similar fashion to the ones  mentioned above.  I have personally reviewed the images and agree with the above interpretation.  Assessment and Plan: Jessica Haas is a pleasant 72  y.o. female with low back pain causing sciatica of the left side, acquired scoliosis, and spondylolisthesis.  She is having significant symptoms from this, but they are not life limiting.  She has a couple of different treatment options for her back.  The first would be an extensive intervention.  The alternative would be spinal cord stimulation.  She would like to consider spinal cord stimulation, so I will make appropriate referrals to the pain clinic, psychology, and for thoracic spine MRI scan.  Additionally, she has a wound on her knee.  I will give her some Medihoney, but have recommended that she contact her primary care provider if it does not continue to heal.  She is starting physical therapy next week.  I will see her back in mid September or sooner if she gets through her spinal cord stimulator trial.  Thank you for involving me in the care of this patient.      Jessica Haas K. Myer Haff MD, [facility]

## 2022-10-31 ENCOUNTER — Telehealth: Payer: Self-pay

## 2022-10-31 NOTE — Telephone Encounter (Signed)
Left pt msg to call about upcoming BBL appointment 11/04/22.  Need to discuss prophylactic Valtrex with BBL./sh

## 2022-11-04 ENCOUNTER — Ambulatory Visit: Payer: Medicare Other | Admitting: Dermatology

## 2022-11-04 VITALS — BP 103/66 | HR 99

## 2022-11-04 DIAGNOSIS — L570 Actinic keratosis: Secondary | ICD-10-CM

## 2022-11-04 DIAGNOSIS — L814 Other melanin hyperpigmentation: Secondary | ICD-10-CM

## 2022-11-04 DIAGNOSIS — L719 Rosacea, unspecified: Secondary | ICD-10-CM

## 2022-11-04 DIAGNOSIS — L988 Other specified disorders of the skin and subcutaneous tissue: Secondary | ICD-10-CM

## 2022-11-04 DIAGNOSIS — W908XXA Exposure to other nonionizing radiation, initial encounter: Secondary | ICD-10-CM | POA: Diagnosis not present

## 2022-11-04 NOTE — Progress Notes (Signed)
Follow-Up Visit   Subjective  Jessica Haas is a 72 y.o. female who presents for the following: patient here today for BBL treatment, also reports a spot at left leg and left cheek that she would like checked and would like to discuss previous botox.    The following portions of the chart were reviewed this encounter and updated as appropriate: medications, allergies, medical history  Review of Systems:  No other skin or systemic complaints except as noted in HPI or Assessment and Plan.  Objective  Well appearing patient in no apparent distress; mood and affect are within normal limits.   A focused examination was performed of the following areas: Face , left lower leg  Relevant exam findings are noted in the Assessment and Plan.      Head - Anterior (Face) Mid face erythema with telangiectasias                     left upper pretibia x 1, left preauricular x 1 (2) Erythematous thin papules/macules with gritty scale.   face Rhytides and volume loss.      face Light tan macules              Assessment & Plan    Rosacea Head - Anterior (Face)  Rosacea is a chronic progressive skin condition usually affecting the face of adults, causing redness and/or acne bumps. It is treatable but not curable. It sometimes affects the eyes (ocular rosacea) as well. It may respond to topical and/or systemic medication and can flare with stress, sun exposure, alcohol, exercise, topical steroids (including hydrocortisone/cortisone 10) and some foods.  Daily application of broad spectrum spf 30+ sunscreen to face is recommended to reduce flares.  Patient treated with BBL today  Patient given cooling gel mask and bbl starter kit at appointment   Photorejuvenation - Head - Anterior (Face)  [facility] BBL - 11/04/22 1800      Patient Details   Current Skin Care: f    Skin Type: I    Anesthestic Cream Applied: No    Photo Takes: Yes    Consent  Signed: Yes      Treatment Details   Date: 11/04/22    Treatment #: 4    Area: face    Filter: 1st Pass;2nd Pass;3rd Pass;4th Pass      1st Pass   Location: F    Device: 560    BBL j/cm2: 26    PW Msec Sec: 27    Cooling Temp: 20    Pulses: 100    7mm: this one      Prior to the procedure, the patient's past medical history, medications, allergies, and the rare but potential risks and complications were reviewed with the patient and a signed consent was obtained.  Pre and post treatment care was discussed and instructions provided.  Patient tolerated the procedure well.   Wynelle Link avoidance was stressed. The patient will call with any problems, questions or concerns prior to their next appointment.    Actinic keratosis (2) left upper pretibia x 1, left preauricular x 1  Actinic keratoses are precancerous spots that appear secondary to cumulative UV radiation exposure/sun exposure over time. They are chronic with expected duration over 1 year. A portion of actinic keratoses will progress to squamous cell carcinoma of the skin. It is not possible to reliably predict which spots will progress to skin cancer and so treatment is recommended to prevent development of skin cancer.  Recommend daily broad spectrum sunscreen SPF 30+ to sun-exposed areas, reapply every 2 hours as needed.  Recommend staying in the shade or wearing long sleeves, sun glasses (UVA+UVB protection) and wide brim hats (4-inch brim around the entire circumference of the hat). Call for new or changing lesions.  Destruction of lesion - left upper pretibia x 1, left preauricular x 1 (2)  Destruction method: cryotherapy   Informed consent: discussed and consent obtained   Lesion destroyed using liquid nitrogen: Yes   Region frozen until ice ball extended beyond lesion: Yes   Outcome: patient tolerated procedure well with no complications   Post-procedure details: wound care instructions given   Additional details:   Prior to procedure, discussed risks of blister formation, small wound, skin dyspigmentation, or rare scar following cryotherapy. Recommend Vaseline ointment to treated areas while healing.   Elastosis of skin face  Patient bothered by lines at brow lift area  Injected 1.25u x 2 for brow lift 1 cm above mid pupillary line    Botox Injection - face Location: brow lift area   Informed consent: Discussed risks (infection, pain, bleeding, bruising, swelling, allergic reaction, paralysis of nearby muscles, eyelid droop, double vision, neck weakness, difficulty breathing, headache, undesirable cosmetic result, and need for additional treatment) and benefits of the procedure, as well as the alternatives.  Informed consent was obtained.  Preparation: The area was cleansed with alcohol.  Procedure Details:  Botox was injected into the dermis with a 30-gauge needle. Pressure applied to any bleeding. Ice packs offered for swelling.  Lot Number:  [id number] Expiration:  09/2024  Total Units Injected:  2.5 units   Plan: Tylenol may be used for headache.  Allow 2 weeks before returning to clinic for additional dosing as needed. Patient will call for any problems.   Related Medications ALTRENO 0.05 % LOTN APPLY ONE APPLICATION TOPICALLY AT BEDTIME.  Lentigines face  Reassured benign age-related growth.  Recommend observation.  Discussed cryotherapy if spot(s) become irritated or inflamed.  Treated with BBL today  Photorejuvenation - face  [place] BBL - 11/04/22 1800      Patient Details   Current Skin Care: f    Skin Type: I    Anesthestic Cream Applied: No    Photo Takes: Yes    Consent Signed: Yes      Treatment Details   Date: 11/04/22    Treatment #: 4    Area: face    Filter: 1st Pass;2nd Pass;3rd Pass;4th Pass     2nd Pass   Location: F    Device: 515    BBL j/cm2: 12    PW Msec Sec: 10    Cooling Temp: 25    Pulses: 79    15x45: this one      3rd Pass   Location: F     Device: 515    BBL j/cm2: 14    PW Msec Sec: 10    Cooling Temp: 25    Pulses: 64    15x15: this one      4th Pass   Location: F    Device: 515    BBL j/cm2: 15    PW Msec Sec: 15    Cooling Temp: 25    Pulses: 11    15x15: this one      Prior to the procedure, the patient's past medical history, medications, allergies, and the rare but potential risks and complications were reviewed with the patient and a signed consent was obtained.  Pre and post treatment care was discussed and instructions provided.  Patient tolerated the procedure well.   Wynelle Link avoidance was stressed. The patient will call with any problems, questions or concerns prior to their next appointment.        Return for ok to schedule tbse in october or november per Dr. Kathie Rhodes, please cancel other tbse in 2025.  I, Asher Muir, CMA, am acting as scribe for Willeen Niece, MD.   Documentation: I have reviewed the above documentation for accuracy and completeness, and I agree with the above.  Willeen Niece, MD

## 2022-11-04 NOTE — Patient Instructions (Addendum)
Actinic keratoses are precancerous spots that appear secondary to cumulative UV radiation exposure/sun exposure over time. They are chronic with expected duration over 1 year. A portion of actinic keratoses will progress to squamous cell carcinoma of the skin. It is not possible to reliably predict which spots will progress to skin cancer and so treatment is recommended to prevent development of skin cancer.  Recommend daily broad spectrum sunscreen SPF 30+ to sun-exposed areas, reapply every 2 hours as needed.  Recommend staying in the shade or wearing long sleeves, sun glasses (UVA+UVB protection) and wide brim hats (4-inch brim around the entire circumference of the hat). Call for new or changing lesions.   Cryotherapy Aftercare  Wash gently with soap and water everyday.   Apply Vaseline and Band-Aid daily until healed.   Due to recent changes in healthcare laws, you may see results of your pathology and/or laboratory studies on MyChart before the doctors have had a chance to review them. We understand that in some cases there may be results that are confusing or concerning to you. Please understand that not all results are received at the same time and often the doctors may need to interpret multiple results in order to provide you with the best plan of care or course of treatment. Therefore, we ask that you please give us 2 business days to thoroughly review all your results before contacting the office for clarification. Should we see a critical lab result, you will be contacted sooner.   If You Need Anything After Your Visit  If you have any questions or concerns for your doctor, please call our main line at [phone] and press option 4 to reach your doctor's medical assistant. If no one answers, please leave a voicemail as directed and we will return your call as soon as possible. Messages left after 4 pm will be answered the following business day.   You may also send us a message via  MyChart. We typically respond to MyChart messages within 1-2 business days.  For prescription refills, please ask your pharmacy to contact our office. Our fax number is [phone].  If you have an urgent issue when the clinic is closed that cannot wait until the next business day, you can page your doctor at the number below.    Please note that while we do our best to be available for urgent issues outside of office hours, we are not available 24/7.   If you have an urgent issue and are unable to reach us, you may choose to seek medical care at your doctor's office, retail clinic, urgent care center, or emergency room.  If you have a medical emergency, please immediately call 911 or go to the emergency department.  Pager Numbers  - Dr. Kowalski: [phone]  - Dr. Moye: [phone]  - Dr. Stewart: [phone]  In the event of inclement weather, please call our main line at [phone] for an update on the status of any delays or closures.  Dermatology Medication Tips: Please keep the boxes that topical medications come in in order to help keep track of the instructions about where and how to use these. Pharmacies typically print the medication instructions only on the boxes and not directly on the medication tubes.   If your medication is too expensive, please contact our office at [phone] option 4 or send us a message through MyChart.   We are unable to tell what your co-pay for medications will be in advance as this is different   depending on your insurance coverage. However, we may be able to find a substitute medication at lower cost or fill out paperwork to get insurance to cover a needed medication.   If a prior authorization is required to get your medication covered by your insurance company, please allow us 1-2 business days to complete this process.  Drug prices often vary depending on where the prescription is filled and some pharmacies may offer cheaper  prices.  The website www.goodrx.com contains coupons for medications through different pharmacies. The prices here do not account for what the cost may be with help from insurance (it may be cheaper with your insurance), but the website can give you the price if you did not use any insurance.  - You can print the associated coupon and take it with your prescription to the pharmacy.  - You may also stop by our office during regular business hours and pick up a GoodRx coupon card.  - If you need your prescription sent electronically to a different pharmacy, notify our office through [facility] MyChart or by phone at [phone] option 4.     Si Usted Necesita Algo Despus de Su Visita  Tambin puede enviarnos un mensaje a travs de MyChart. Por lo general respondemos a los mensajes de MyChart en el transcurso de 1 a 2 das hbiles.  Para renovar recetas, por favor pida a su farmacia que se ponga en contacto con nuestra oficina. Nuestro nmero de fax es el [phone].  Si tiene un asunto urgente cuando la clnica est cerrada y que no puede esperar hasta el siguiente da hbil, puede llamar/localizar a su doctor(a) al nmero que aparece a continuacin.   Por favor, tenga en cuenta que aunque hacemos todo lo posible para estar disponibles para asuntos urgentes fuera del horario de oficina, no estamos disponibles las 24 horas del da, los 7 das de la semana.   Si tiene un problema urgente y no puede comunicarse con nosotros, puede optar por buscar atencin mdica  en el consultorio de su doctor(a), en una clnica privada, en un centro de atencin urgente o en una sala de emergencias.  Si tiene una emergencia mdica, por favor llame inmediatamente al 911 o vaya a la sala de emergencias.  Nmeros de bper  - Dr. Kowalski: [phone]  - Dra. Moye: [phone]  - Dra. Stewart: [phone]  En caso de inclemencias del tiempo, por favor llame a nuestra lnea principal al [phone]  para una actualizacin sobre el estado de cualquier retraso o cierre.  Consejos para la medicacin en dermatologa: Por favor, guarde las cajas en las que vienen los medicamentos de uso tpico para ayudarle a seguir las instrucciones sobre dnde y cmo usarlos. Las farmacias generalmente imprimen las instrucciones del medicamento slo en las cajas y no directamente en los tubos del medicamento.   Si su medicamento es muy caro, por favor, pngase en contacto con nuestra oficina llamando al [phone] y presione la opcin 4 o envenos un mensaje a travs de MyChart.   No podemos decirle cul ser su copago por los medicamentos por adelantado ya que esto es diferente dependiendo de la cobertura de su seguro. Sin embargo, es posible que podamos encontrar un medicamento sustituto a menor costo o llenar un formulario para que el seguro cubra el medicamento que se considera necesario.   Si se requiere una autorizacin previa para que su compaa de seguros cubra su medicamento, por favor permtanos de 1 a 2 das hbiles para completar este   proceso.  Los precios de los medicamentos varan con frecuencia dependiendo del lugar de dnde se surte la receta y alguna farmacias pueden ofrecer precios ms baratos.  El sitio web www.goodrx.com tiene cupones para medicamentos de diferentes farmacias. Los precios aqu no tienen en cuenta lo que podra costar con la ayuda del seguro (puede ser ms barato con su seguro), pero el sitio web puede darle el precio si no utiliz ningn seguro.  - Puede imprimir el cupn correspondiente y llevarlo con su receta a la farmacia.  - Tambin puede pasar por nuestra oficina durante el horario de atencin regular y recoger una tarjeta de cupones de GoodRx.  - Si necesita que su receta se enve electrnicamente a una farmacia diferente, informe a nuestra oficina a travs de MyChart de [facility] o por telfono llamando al [phone] y presione la opcin 4.  

## 2022-11-09 ENCOUNTER — Ambulatory Visit
Admission: RE | Admit: 2022-11-09 | Discharge: 2022-11-09 | Disposition: A | Discharge status: Home / Self Care | Payer: Medicare Other | Source: Ambulatory Visit | Attending: Neurosurgery | Admitting: Neurosurgery

## 2022-11-09 DIAGNOSIS — G894 Chronic pain syndrome: Secondary | ICD-10-CM

## 2022-11-13 ENCOUNTER — Other Ambulatory Visit: Payer: Self-pay

## 2022-11-13 ENCOUNTER — Ambulatory Visit (INDEPENDENT_AMBULATORY_CARE_PROVIDER_SITE_OTHER): Payer: Self-pay | Admitting: Dermatology

## 2022-11-13 VITALS — BP 102/68

## 2022-11-13 DIAGNOSIS — L988 Other specified disorders of the skin and subcutaneous tissue: Secondary | ICD-10-CM

## 2022-11-13 MED ORDER — MUPIROCIN 2 % EX OINT: 1.0000 | TOPICAL_OINTMENT | Freq: Every day | CUTANEOUS | 6 refills | Status: DC

## 2022-11-13 NOTE — Progress Notes (Signed)
Mupirocin oint refill

## 2022-11-13 NOTE — Patient Instructions (Signed)

## 2022-11-13 NOTE — Progress Notes (Signed)
   Follow-Up Visit   Subjective  Jessica Haas is a 72 y.o. female who presents for the following: Botox for facial elastosis And Fillers to temples.  The following portions of the chart were reviewed this encounter and updated as appropriate: medications, allergies, medical history  Review of Systems:  No other skin or systemic complaints except as noted in HPI or Assessment and Plan.  Objective  Well appearing patient in no apparent distress; mood and affect are within normal limits.  A focused examination was performed of the face.  Relevant physical exam findings are noted in the Assessment and Plan.  Before botox/filler photos              After filler photos         injection map photo    Assessment & Plan   Elastosis of skin  Related Medications ALTRENO 0.05 % LOTN APPLY ONE APPLICATION TOPICALLY AT BEDTIME.   Facial Elastosis Botox 3.75 units injected today to: - lower eyelids 1.25 units x 2 - L botox comma 1.25 units  Voluma 1ml injected today to: - bil temples  Location: bil temples, bil lower eyelids, L botox comma  Informed consent: Discussed risks (infection, pain, bleeding, bruising, swelling, allergic reaction, paralysis of nearby muscles, eyelid droop, double vision, neck weakness, difficulty breathing, headache, undesirable cosmetic result, and need for additional treatment) and benefits of the procedure, as well as the alternatives.  Informed consent was obtained.  Preparation: The area was cleansed with alcohol.  Procedure Details:  Botox was injected into the dermis with a 30-gauge needle. Pressure applied to any bleeding. Ice packs offered for swelling.  Lot Number:  [id number] Expiration:  6/26  Total Units Injected:  3.75  Plan: Tylenol may be used for headache.  Allow 2 weeks before returning to clinic for additional dosing as needed. Patient will call for any problems.  Prior to the procedure, the patient's past  medical history, allergies and the rare but potential risks and complications were reviewed with the patient and a signed consent was obtained. Pre and post-treatment care was discussed and instructions provided.  Location: bil temples  Filler TypeNyoka Cowden lot [id number] exp 06/05/23  Procedure: The area was prepped thoroughly with Puracyn. After introducing the needle into the desired treatment area, the syringe plunger was drawn back to ensure there was no flash of blood prior to injecting the filler in order to minimize risk of intravascular injection and vascular occlusion. After injection of the filler, the treated areas were cleansed and iced to reduce swelling. Post-treatment instructions were reviewed with the patient.       Patient tolerated the procedure well. The patient will call with any problems, questions or concerns prior to their next appointment.   Return for fillers, plan voluma to temples, possible filler to mid face, possible belotero to under eyes.  I, Jessica Haas, RMA, am acting as scribe for Armida Sans, MD .  Documentation: I have reviewed the above documentation for accuracy and completeness, and I agree with the above.  Armida Sans, MD

## 2022-11-17 ENCOUNTER — Encounter: Payer: Self-pay | Admitting: Dermatology

## 2022-11-29 ENCOUNTER — Other Ambulatory Visit: Payer: Self-pay | Admitting: Dermatology

## 2022-12-17 ENCOUNTER — Ambulatory Visit (INDEPENDENT_AMBULATORY_CARE_PROVIDER_SITE_OTHER): Payer: Self-pay | Admitting: Dermatology

## 2022-12-17 DIAGNOSIS — L988 Other specified disorders of the skin and subcutaneous tissue: Secondary | ICD-10-CM

## 2022-12-17 NOTE — Progress Notes (Signed)
   Follow-Up Visit   Subjective  Jessica Haas is a 72 y.o. female who presents for the following: filler for facial elastosis  The following portions of the chart were reviewed this encounter and updated as appropriate: medications, allergies, medical history  Review of Systems:  No other skin or systemic complaints except as noted in HPI or Assessment and Plan.  Objective  Well appearing patient in no apparent distress; mood and affect are within normal limits.  A focused examination was performed of the face. Relevant physical exam findings are noted in the Assessment and Plan or shown in photos.                               Assessment & Plan    Facial Elastosis  Prior to the procedure, the patient's past medical history, allergies and the rare but potential risks and complications were reviewed with the patient and a signed consent was obtained. Pre and post-treatment care was discussed and instructions provided.   Location: temples  Filler TypeNyoka Cowden  Lot number: [id number] Expiration date: 06/05/2023  Procedure: The area was prepped thoroughly with Puracyn. After introducing the needle into the desired treatment area, the syringe plunger was drawn back to ensure there was no flash of blood prior to injecting the filler in order to minimize risk of intravascular injection and vascular occlusion. After injection of the filler, the treated areas were cleansed and iced to reduce swelling. Post-treatment instructions were reviewed with the patient.       Patient tolerated the procedure well. The patient will call with any problems, questions or concerns prior to their next appointment.  Botox 8 units (lip flip) injected today to: - Upper lip 4 units - Lower lip 4 units  Lot number: [id number] Expiration date: 12/2024   Location: Upper and lower lip   Informed consent: Discussed risks (infection, pain, bleeding, bruising, swelling,  allergic reaction, paralysis of nearby muscles, eyelid droop, double vision, neck weakness, difficulty breathing, headache, undesirable cosmetic result, and need for additional treatment) and benefits of the procedure, as well as the alternatives.  Informed consent was obtained.   Preparation: The area was cleansed with alcohol.   Procedure Details:  Botox was injected into the dermis with a 30-gauge needle. Pressure applied to any bleeding. Ice packs offered for swelling.  Return for appointment as scheduled.  Maylene Roes, CMA, am acting as scribe for Armida Sans, MD .  Documentation: I have reviewed the above documentation for accuracy and completeness, and I agree with the above.  Armida Sans, MD

## 2022-12-17 NOTE — Patient Instructions (Signed)

## 2022-12-20 ENCOUNTER — Encounter: Payer: Self-pay | Admitting: Dermatology

## 2022-12-31 ENCOUNTER — Ambulatory Visit: Payer: Medicare Other | Admitting: Neurosurgery

## 2023-01-13 ENCOUNTER — Ambulatory Visit (INDEPENDENT_AMBULATORY_CARE_PROVIDER_SITE_OTHER): Payer: Self-pay | Admitting: Dermatology

## 2023-01-13 DIAGNOSIS — L988 Other specified disorders of the skin and subcutaneous tissue: Secondary | ICD-10-CM

## 2023-01-13 NOTE — Patient Instructions (Signed)

## 2023-01-13 NOTE — Progress Notes (Signed)
   Follow-Up Visit   Subjective  Jessica Haas is a 72 y.o. female who presents for the following: Botox for facial elastosis  The following portions of the chart were reviewed this encounter and updated as appropriate: medications, allergies, medical history  Review of Systems:  No other skin or systemic complaints except as noted in HPI or Assessment and Plan.  Objective  Well appearing patient in no apparent distress; mood and affect are within normal limits.  A focused examination was performed of the face.  Relevant physical exam findings are noted in the Assessment and Plan.    Assessment & Plan   Facial Elastosis   Botox 62.5 units injected as noted below   - Brow lift 2.5 units x 2, 1.25 units x 2 - Frown complex 22.5 units - Forehead 6.5 units - Crows feet 7.5 units x 2 - Upper lip 6 units - DAO's 2.5 x 2 units   Location: frown complex, forehead, brow lift, crows feet,upper lip, DAO's   Informed consent: Discussed risks (infection, pain, bleeding, bruising, swelling, allergic reaction, paralysis of nearby muscles, eyelid droop, double vision, neck weakness, difficulty breathing, headache, undesirable cosmetic result, and need for additional treatment) and benefits of the procedure, as well as the alternatives.  Informed consent was obtained.  Preparation: The area was cleansed with alcohol.  Procedure Details:  Botox was injected into the dermis with a 30-gauge needle. Pressure applied to any bleeding. Ice packs offered for swelling.  Lot Number:  [id number] Expiration:  12/21/2024  Total Units Injected:  62.5  Plan: Tylenol may be used for headache.  Allow 2 weeks before returning to clinic for additional dosing as needed. Patient will call for any problems.  Will evaluate at f/up if need to add in botox comma injection  Return in about 2 weeks (around 01/27/2023) for Botox f/u.  I, Angelique Holm, CMA, am acting as scribe for Willeen Niece, MD .    Documentation: I have reviewed the above documentation for accuracy and completeness, and I agree with the above.  Willeen Niece, MD

## 2023-01-20 ENCOUNTER — Other Ambulatory Visit: Payer: Self-pay

## 2023-01-20 MED ORDER — IVERMECTIN 1 % EX CREA: TOPICAL_CREAM | CUTANEOUS | 6 refills | Status: DC

## 2023-01-20 NOTE — Progress Notes (Signed)
Fax received from [facility] needed me to add that patient needs 45 grams per 30 days for insurance approval. Escripted with changes.

## 2023-01-27 ENCOUNTER — Ambulatory Visit (INDEPENDENT_AMBULATORY_CARE_PROVIDER_SITE_OTHER): Payer: Self-pay | Admitting: Dermatology

## 2023-01-27 ENCOUNTER — Encounter: Payer: Self-pay | Admitting: Dermatology

## 2023-01-27 VITALS — BP 102/70

## 2023-01-27 DIAGNOSIS — L988 Other specified disorders of the skin and subcutaneous tissue: Secondary | ICD-10-CM

## 2023-01-27 NOTE — Progress Notes (Signed)
   Follow-Up Visit   Subjective  Jessica Haas is a 72 y.o. female who presents for the following: botox to botox commas The patient has spots, moles and lesions to be evaluated, some may be new or changing and the patient may have concern these could be cancer.   The following portions of the chart were reviewed this encounter and updated as appropriate: medications, allergies, medical history  Review of Systems:  No other skin or systemic complaints except as noted in HPI or Assessment and Plan.  Objective  Well appearing patient in no apparent distress; mood and affect are within normal limits.   A focused examination was performed of the following areas: face  Relevant exam findings are noted in the Assessment and Plan.    Assessment & Plan   FACIAL ELASTOSIS Exam: Rhytides and volume loss.  Treatment Plan: Facial Elastosis  Botox 7.5 units injected today to: - Botox Comma 1.25 x 2 (right and left side) - Crow's Feet 2.5 x 2 (right and left side)  Location: Botox commas R and L Before photos             Injection map   Informed consent: Discussed risks (infection, pain, bleeding, bruising, swelling, allergic reaction, paralysis of nearby muscles, eyelid droop, double vision, neck weakness, difficulty breathing, headache, undesirable cosmetic result, and need for additional treatment) and benefits of the procedure, as well as the alternatives.  Informed consent was obtained.  Preparation: The area was cleansed with alcohol.  Procedure Details:  Botox was injected into the dermis with a 30-gauge needle. Pressure applied to any bleeding. Ice packs offered for swelling.  Lot Number:  [id number] Expiration:  09/26  Total Units Injected:  7.5  Plan: Tylenol may be used for headache.  Allow 2 weeks before returning to clinic for additional dosing as needed. Patient will call for any problems.   Recommend daily broad spectrum sunscreen SPF 30+ to  sun-exposed areas, reapply every 2 hours as needed. Call for new or changing lesions.  Staying in the shade or wearing long sleeves, sun glasses (UVA+UVB protection) and wide brim hats (4-inch brim around the entire circumference of the hat) are also recommended for sun protection.       Return for as schedulded.  I, [name], RMA, am acting as scribe for Willeen Niece, MD .   Documentation: I have reviewed the above documentation for accuracy and completeness, and I agree with the above.  Willeen Niece, MD

## 2023-01-27 NOTE — Patient Instructions (Signed)

## 2023-03-04 ENCOUNTER — Ambulatory Visit: Payer: Medicare Other | Admitting: Dermatology

## 2023-03-04 ENCOUNTER — Encounter: Payer: Self-pay | Admitting: Dermatology

## 2023-03-04 DIAGNOSIS — L814 Other melanin hyperpigmentation: Secondary | ICD-10-CM

## 2023-03-04 DIAGNOSIS — D1801 Hemangioma of skin and subcutaneous tissue: Secondary | ICD-10-CM

## 2023-03-04 DIAGNOSIS — L820 Inflamed seborrheic keratosis: Secondary | ICD-10-CM

## 2023-03-04 DIAGNOSIS — L570 Actinic keratosis: Secondary | ICD-10-CM

## 2023-03-04 DIAGNOSIS — L719 Rosacea, unspecified: Secondary | ICD-10-CM

## 2023-03-04 DIAGNOSIS — L853 Xerosis cutis: Secondary | ICD-10-CM

## 2023-03-04 DIAGNOSIS — B079 Viral wart, unspecified: Secondary | ICD-10-CM | POA: Diagnosis not present

## 2023-03-04 DIAGNOSIS — Z85828 Personal history of other malignant neoplasm of skin: Secondary | ICD-10-CM

## 2023-03-04 DIAGNOSIS — L821 Other seborrheic keratosis: Secondary | ICD-10-CM

## 2023-03-04 DIAGNOSIS — Z872 Personal history of diseases of the skin and subcutaneous tissue: Secondary | ICD-10-CM

## 2023-03-04 DIAGNOSIS — Z1283 Encounter for screening for malignant neoplasm of skin: Secondary | ICD-10-CM

## 2023-03-04 DIAGNOSIS — L578 Other skin changes due to chronic exposure to nonionizing radiation: Secondary | ICD-10-CM | POA: Diagnosis not present

## 2023-03-04 DIAGNOSIS — Z7189 Other specified counseling: Secondary | ICD-10-CM

## 2023-03-04 DIAGNOSIS — D692 Other nonthrombocytopenic purpura: Secondary | ICD-10-CM

## 2023-03-04 DIAGNOSIS — W908XXA Exposure to other nonionizing radiation, initial encounter: Secondary | ICD-10-CM | POA: Diagnosis not present

## 2023-03-04 DIAGNOSIS — B353 Tinea pedis: Secondary | ICD-10-CM

## 2023-03-04 DIAGNOSIS — D229 Melanocytic nevi, unspecified: Secondary | ICD-10-CM

## 2023-03-04 NOTE — Progress Notes (Signed)
Follow-Up Visit   Subjective  Jessica Haas is a 72 y.o. female who presents for the following: Skin Cancer Screening and Full Body Skin Exam  The patient presents for Total-Body Skin Exam (TBSE) for skin cancer screening and mole check. The patient has spots, moles and lesions to be evaluated, some may be new or changing and the patient may have concern these could be cancer.  Hx of BCC, SCC, AK's. Patient needs refills of Soolantra and mupirocin.   The following portions of the chart were reviewed this encounter and updated as appropriate: medications, allergies, medical history  Review of Systems:  No other skin or systemic complaints except as noted in HPI or Assessment and Plan.  Objective  Well appearing patient in no apparent distress; mood and affect are within normal limits.  A full examination was performed including scalp, head, eyes, ears, nose, lips, neck, chest, axillae, abdomen, back, buttocks, bilateral upper extremities, bilateral lower extremities, hands, feet, fingers, toes, fingernails, and toenails. All findings within normal limits unless otherwise noted below.   Relevant physical exam findings are noted in the Assessment and Plan.  L lower back x 2, L upper chest x 1, R ankle x 1, R popliteal x 2, L axilla x 1, R post thigh x 1 (8) Erythematous stuck-on, waxy papule  Mid Forehead x 2, L hand dorsum x 1, intermammary chest x 1 (4) Erythematous thin papules/macules with gritty scale.   R 3rd finger Firm flesh papule-- Discussed viral etiology and contagion.   Right Temple, right preauricular (2) Stuck-on, waxy, tan-brown papules and plaques -- Discussed benign etiology and prognosis.     Assessment & Plan   SKIN CANCER SCREENING PERFORMED TODAY.  ACTINIC DAMAGE WITH PRECANCEROUS ACTINIC KERATOSES Counseling for Topical Chemotherapy Management: Patient exhibits: - Severe, confluent actinic changes with pre-cancerous actinic keratoses that is  secondary to cumulative UV radiation exposure over time - Condition that is severe; chronic, not at goal. - diffuse scaly erythematous macules and papules with underlying dyspigmentation - Discussed Prescription "Field Treatment" topical Chemotherapy for Severe, Chronic Confluent Actinic Changes with Pre-Cancerous Actinic Keratoses Field treatment involves treatment of an entire area of skin that has confluent Actinic Changes (Sun/ Ultraviolet light damage) and PreCancerous Actinic Keratoses by method of PhotoDynamic Therapy (PDT) and/or prescription Topical Chemotherapy agents such as 5-fluorouracil, 5-fluorouracil/calcipotriene, and/or imiquimod.  The purpose is to decrease the number of clinically evident and subclinical PreCancerous lesions to prevent progression to development of skin cancer by chemically destroying early precancer changes that may or may not be visible.  It has been shown to reduce the risk of developing skin cancer in the treated area. As a result of treatment, redness, scaling, crusting, and open sores may occur during treatment course. One or more than one of these methods may be used and may have to be used several times to control, suppress and eliminate the PreCancerous changes. Discussed treatment course, expected reaction, and possible side effects. - Recommend daily broad spectrum sunscreen SPF 30+ to sun-exposed areas, reapply every 2 hours as needed.  - Staying in the shade or wearing long sleeves, sun glasses (UVA+UVB protection) and wide brim hats (4-inch brim around the entire circumference of the hat) are also recommended. - Call for new or changing lesions. - Recommend red light PDT with debridement to face.  Patient will schedule.     LENTIGINES, SEBORRHEIC KERATOSES, HEMANGIOMAS - Benign normal skin lesions - Benign-appearing - Call for any changes - SK touch up  Right preauricular, right temple  MELANOCYTIC NEVI - Tan-brown and/or pink-flesh-colored symmetric  macules and papules - Benign appearing on exam today - Observation - Call clinic for new or changing moles - Recommend daily use of broad spectrum spf 30+ sunscreen to sun-exposed areas.   ROSACEA Exam Mild mid face erythema  Chronic condition with duration or expected duration over one year. Currently well-controlled. Pt gets regular BBLs for maintenance  Rosacea is a chronic progressive skin condition usually affecting the face of adults, causing redness and/or acne bumps. It is treatable but not curable. It sometimes affects the eyes (ocular rosacea) as well. It may respond to topical and/or systemic medication and can flare with stress, sun exposure, alcohol, exercise, topical steroids (including hydrocortisone/cortisone 10) and some foods.  Daily application of broad spectrum spf 30+ sunscreen to face is recommended to reduce flares.  Patient denies grittiness of the eyes  Treatment Plan Continue Soolantra nightly to face Pt scheduled for BBL in November and January  Counseling for BBL / IPL / Laser and Coordination of Care Discussed the treatment option of Broad Band Light (BBL) /Intense Pulsed Light (IPL)/ Laser for skin discoloration, including brown spots and redness.  Typically we recommend at least 1-3 treatment sessions about 5-8 weeks apart for best results.  Cannot have tanned skin when BBL performed, and regular use of sunscreen/photoprotection is advised after the procedure to help maintain results. The patient's condition may also require "maintenance treatments" in the future.  The fee for BBL / laser treatments is $350 per treatment session for the whole face.  A fee can be quoted for other parts of the body.  Insurance typically does not pay for BBL/laser treatments and therefore the fee is an out-of-pocket cost. Recommend prophylactic valtrex treatment. Once scheduled for procedure, will send Rx in prior to patient's appointment.      Purpura - Chronic; persistent and  recurrent.  Treatable, but not curable. Pt on Celebrex - Violaceous macules and patches - Benign - Related to trauma, age, sun damage and/or use of blood thinners, chronic use of topical and/or oral steroids - Observe - Can use OTC arnica containing moisturizer such as Dermend Bruise Formula if desired - recommend AmLactin 15% cream/lotion every day/bid - Call for worsening or other concerns  Xerosis - diffuse xerotic patches - recommend gentle, hydrating skin care - gentle skin care handout given  TINEA PEDIS Exam: Scaling and maceration 4th web space R foot  Treatment Plan: Start over the counter Terbinafine cream to the feet and between the toes twice daily for one month. Then apply once a week to help prevent recurrence.     Inflamed seborrheic keratosis (8) L lower back x 2, L upper chest x 1, R ankle x 1, R popliteal x 2, L axilla x 1, R post thigh x 1  Symptomatic, irritating, patient would like treated.  Benign-appearing.  Call clinic for new or changing lesions.   Pt uses mupirocin for wound care- will send in rf   Destruction of lesion - L lower back x 2, L upper chest x 1, R ankle x 1, R popliteal x 2, L axilla x 1, R post thigh x 1 (8)  Destruction method: cryotherapy   Informed consent: discussed and consent obtained   Lesion destroyed using liquid nitrogen: Yes   Region frozen until ice ball extended beyond lesion: Yes   Outcome: patient tolerated procedure well with no complications   Post-procedure details: wound care instructions given  Additional details:  Prior to procedure, discussed risks of blister formation, small wound, skin dyspigmentation, or rare scar following cryotherapy. Recommend Vaseline ointment to treated areas while healing.   AK (actinic keratosis) (4) Mid Forehead x 2, L hand dorsum x 1, intermammary chest x 1  Actinic keratoses are precancerous spots that appear secondary to cumulative UV radiation exposure/sun exposure over time.  They are chronic with expected duration over 1 year. A portion of actinic keratoses will progress to squamous cell carcinoma of the skin. It is not possible to reliably predict which spots will progress to skin cancer and so treatment is recommended to prevent development of skin cancer.  Recommend daily broad spectrum sunscreen SPF 30+ to sun-exposed areas, reapply every 2 hours as needed.  Recommend staying in the shade or wearing long sleeves, sun glasses (UVA+UVB protection) and wide brim hats (4-inch brim around the entire circumference of the hat). Call for new or changing lesions.  Recheck forehead on follow up. Consider topical 5FU/VitD to forehead or Red light PDT to face  Destruction of lesion - Mid Forehead x 2, L hand dorsum x 1, intermammary chest x 1 (4)  Destruction method: cryotherapy   Informed consent: discussed and consent obtained   Lesion destroyed using liquid nitrogen: Yes   Region frozen until ice ball extended beyond lesion: Yes   Outcome: patient tolerated procedure well with no complications   Post-procedure details: wound care instructions given   Additional details:  Prior to procedure, discussed risks of blister formation, small wound, skin dyspigmentation, or rare scar following cryotherapy. Recommend Vaseline ointment to treated areas while healing.   Viral warts, unspecified type R 3rd finger  Viral Wart (HPV) Counseling  Discussed viral / HPV (Human Papilloma Virus) etiology and risk of spread /infectivity to other areas of body as well as to other people.  Multiple treatments and methods may be required to clear warts and it is possible treatment may not be successful.  Treatment risks include discoloration; scarring and there is still potential for wart recurrence.  Destruction of lesion - R 3rd finger  Destruction method: cryotherapy   Informed consent: discussed and consent obtained   Lesion destroyed using liquid nitrogen: Yes   Region frozen until  ice ball extended beyond lesion: Yes   Outcome: patient tolerated procedure well with no complications   Post-procedure details: wound care instructions given   Additional details:  Prior to procedure, discussed risks of blister formation, small wound, skin dyspigmentation, or rare scar following cryotherapy. Recommend Vaseline ointment to treated areas while healing.   Seborrheic keratosis (2) Right Temple, right preauricular  Reassured benign age-related growth.  Recommend observation.  Discussed cryotherapy if spot(s) become irritated or inflamed.  Cosmetic touch up today- no charge.   Destruction of lesion - Right Temple, right preauricular (2)  Destruction method: cryotherapy   Informed consent: discussed and consent obtained   Lesion destroyed using liquid nitrogen: Yes   Region frozen until ice ball extended beyond lesion: Yes   Outcome: patient tolerated procedure well with no complications   Post-procedure details: wound care instructions given   Additional details:  Prior to procedure, discussed risks of blister formation, small wound, skin dyspigmentation, or rare scar following cryotherapy. Recommend Vaseline ointment to treated areas while healing.   Skin cancer screening  Actinic skin damage  Lentigo  Hemangioma of skin  Nevus  Rosacea  Senile purpura (HCC)  Xerosis cutis  Tinea pedis of right foot  Counseling and coordination of care  Return for BBL with Dr. Roseanne Reno, as scheduled, recheck forehead, 6 month follow up, PDT to face with redlight.  Anise Salvo, RMA, am acting as scribe for Willeen Niece, MD .   Documentation: I have reviewed the above documentation for accuracy and completeness, and I agree with the above.  Willeen Niece, MD

## 2023-03-04 NOTE — Patient Instructions (Addendum)
Start over the counter Terbinafine cream to the feet and between the toes twice daily for one month. Then apply once a week to help prevent recurrence.   Recommend OTC Zeasorb AF powder to body folds daily after shower.  It is often found in the athlete's foot section in the pharmacy.  Avoid using powders that contain cornstarch.  Cryotherapy Aftercare  Wash gently with soap and water everyday.   Apply Vaseline and Band-Aid daily until healed.   Melanoma ABCDEs  Melanoma is the most dangerous type of skin cancer, and is the leading cause of death from skin disease.  You are more likely to develop melanoma if you: Have light-colored skin, light-colored eyes, or red or blond hair Spend a lot of time in the sun Tan regularly, either outdoors or in a tanning bed Have had blistering sunburns, especially during childhood Have a close family member who has had a melanoma Have atypical moles or large birthmarks  Early detection of melanoma is key since treatment is typically straightforward and cure rates are extremely high if we catch it early.   The first sign of melanoma is often a change in a mole or a new dark spot.  The ABCDE system is a way of remembering the signs of melanoma.  A for asymmetry:  The two halves do not match. B for border:  The edges of the growth are irregular. C for color:  A mixture of colors are present instead of an even brown color. D for diameter:  Melanomas are usually (but not always) greater than 6mm - the size of a pencil eraser. E for evolution:  The spot keeps changing in size, shape, and color.  Please check your skin once per month between visits. You can use a small mirror in front and a large mirror behind you to keep an eye on the back side or your body.   If you see any new or changing lesions before your next follow-up, please call to schedule a visit.  Please continue daily skin protection including broad spectrum sunscreen SPF 30+ to sun-exposed  areas, reapplying every 2 hours as needed when you're outdoors.    Due to recent changes in healthcare laws, you may see results of your pathology and/or laboratory studies on MyChart before the doctors have had a chance to review them. We understand that in some cases there may be results that are confusing or concerning to you. Please understand that not all results are received at the same time and often the doctors may need to interpret multiple results in order to provide you with the best plan of care or course of treatment. Therefore, we ask that you please give [place] 2 business days to thoroughly review all your results before contacting the office for clarification. Should we see a critical lab result, you will be contacted sooner.   If You Need Anything After Your Visit  If you have any questions or concerns for your doctor, please call our main line at [phone] and press option 4 to reach your doctor's medical assistant. If no one answers, please leave a voicemail as directed and we will return your call as soon as possible. Messages left after 4 pm will be answered the following business day.   You may also send [place] a message via MyChart. We typically respond to MyChart messages within 1-2 business days.  For prescription refills, please ask your pharmacy to contact our office. Our fax number is [phone].  If  you have an urgent issue when the clinic is closed that cannot wait until the next business day, you can page your doctor at the number below.    Please note that while we do our best to be available for urgent issues outside of office hours, we are not available 24/7.   If you have an urgent issue and are unable to reach [place], you may choose to seek medical care at your doctor's office, retail clinic, urgent care center, or emergency room.  If you have a medical emergency, please immediately call 911 or go to the emergency department.  Pager Numbers  - Dr. Gwen Pounds:  [phone]  - Dr. Roseanne Reno: [phone]  - Dr. Katrinka Blazing: [phone]   In the event of inclement weather, please call our main line at [phone] for an update on the status of any delays or closures.  Dermatology Medication Tips: Please keep the boxes that topical medications come in in order to help keep track of the instructions about where and how to use these. Pharmacies typically print the medication instructions only on the boxes and not directly on the medication tubes.   If your medication is too expensive, please contact our office at [phone] option 4 or send [place] a message through MyChart.   We are unable to tell what your co-pay for medications will be in advance as this is different depending on your insurance coverage. However, we may be able to find a substitute medication at lower cost or fill out paperwork to get insurance to cover a needed medication.   If a prior authorization is required to get your medication covered by your insurance company, please allow [place] 1-2 business days to complete this process.  Drug prices often vary depending on where the prescription is filled and some pharmacies may offer cheaper prices.  The website www.goodrx.com contains coupons for medications through different pharmacies. The prices here do not account for what the cost may be with help from insurance (it may be cheaper with your insurance), but the website can give you the price if you did not use any insurance.  - You can print the associated coupon and take it with your prescription to the pharmacy.  - You may also stop by our office during regular business hours and pick up a GoodRx coupon card.  - If you need your prescription sent electronically to a different pharmacy, notify our office through [facility] or by phone at [phone] option 4.

## 2023-03-05 ENCOUNTER — Other Ambulatory Visit: Payer: Self-pay

## 2023-03-05 ENCOUNTER — Telehealth: Payer: Self-pay

## 2023-03-05 MED ORDER — IVERMECTIN 1 % EX CREA: TOPICAL_CREAM | CUTANEOUS | 6 refills | Status: DC

## 2023-03-05 MED ORDER — MUPIROCIN 2 % EX OINT: 1.0000 | TOPICAL_OINTMENT | Freq: Every day | CUTANEOUS | 6 refills | Status: AC

## 2023-03-05 NOTE — Telephone Encounter (Signed)
Spoke to patient about upcoming BBL appointment on 03/17/23 and the recommendation for starting Valtrex for prophylactic treatment.  Patient said she has not ever had to use the Valtrex in past with BBL treatments but she does have some at home.  Advised if she wanted to take the directions would be Valtrex 500mg  1 po bid for 7 days starting 1 day prior to procedure./sh

## 2023-03-13 ENCOUNTER — Ambulatory Visit: Payer: Medicare Other | Admitting: Neurosurgery

## 2023-03-17 ENCOUNTER — Ambulatory Visit: Payer: Medicare Other | Admitting: Dermatology

## 2023-03-17 ENCOUNTER — Ambulatory Visit (INDEPENDENT_AMBULATORY_CARE_PROVIDER_SITE_OTHER): Payer: Self-pay | Admitting: Dermatology

## 2023-03-17 DIAGNOSIS — I781 Nevus, non-neoplastic: Secondary | ICD-10-CM

## 2023-03-17 DIAGNOSIS — L719 Rosacea, unspecified: Secondary | ICD-10-CM

## 2023-03-17 DIAGNOSIS — L814 Other melanin hyperpigmentation: Secondary | ICD-10-CM

## 2023-03-17 NOTE — Patient Instructions (Signed)

## 2023-03-17 NOTE — Progress Notes (Signed)
Follow-Up Visit   Subjective  Jessica Haas is a 72 y.o. female who presents for the following: patient here today for treatment of redness and spots at face with BBL.   She states she has a few areas on forehead that are healing since frozen   The patient has spots, moles and lesions to be evaluated, some may be new or changing and the patient may have concern these could be cancer.   The following portions of the chart were reviewed this encounter and updated as appropriate: medications, allergies, medical history  Review of Systems:  No other skin or systemic complaints except as noted in HPI or Assessment and Plan.  Objective  Well appearing patient in no apparent distress; mood and affect are within normal limits.   A focused examination was performed of the following areas: Face   Relevant exam findings are noted in the Assessment and Plan.             Assessment & Plan   ROSACEA Exam Mid face erythema with telangiectasias    Rosacea is a chronic progressive skin condition usually affecting the face of adults, causing redness and/or acne bumps. It is treatable but not curable. It sometimes affects the eyes (ocular rosacea) as well. It may respond to topical and/or systemic medication and can flare with stress, sun exposure, alcohol, exercise, topical steroids (including hydrocortisone/cortisone 10) and some foods.  Daily application of broad spectrum spf 30+ sunscreen to face is recommended to reduce flares.   Treatment Plan Patient treated with BBL today     3rd Pass   Location: --   reds on cheeks, nose, chin   Device: 560    BBL j/cm2: 15    PW Msec Sec: 15    Cooling Temp: 15    Pulses: 153    15x15: this one       Prior to the procedure, the patient's past medical history, medications, allergies, and the rare but potential risks and complications were reviewed with the patient and a signed consent was obtained.  Pre and post treatment care  was discussed and instructions provided.   Patient tolerated the procedure well.    Wynelle Link avoidance was stressed. The patient will call with any problems, questions or concerns prior to their next appointme   LENTIGINES  Face   Exam Light tan macules   Reassured benign age-related growth.  Recommend observation.  Discussed cryotherapy if spot(s) become irritated or inflamed.   Treated with BBL today   [place] BBL - 03/17/23 1500      Patient Details   Current Skin Care: face    Skin Type: I    Anesthestic Cream Applied: No    Photo Takes: Yes    Consent Signed: Yes    Improvement from Previous Treatment: Yes      Treatment Details   Date: 03/17/23    Treatment #: 2    Area: f    Filter: 515 1st Pass;2nd Pass;    1st Pass   Location: F   browns at cheeks   Device: 515    BBL j/cm2: 10    PW Msec Sec: 10    Cooling Temp: 15    Pulses: 116    15x45: this one      2nd Pass   Location: F   browns mid face and forehead   Device: 515    BBL j/cm2: 12    PW Msec Sec: 10  Cooling Temp: 15    Pulses: 191    15x15: this one    Prior to the procedure, the patient's past medical history, medications, allergies, and the rare but potential risks and complications were reviewed with the patient and a signed consent was obtained.  Pre and post treatment care was discussed and instructions provided.   Patient tolerated the procedure well. Healing Aks were not treated today   Sun avoidance was stressed. The patient will call with any problems, questions or concerns prior to their next appointment.   Return in about 2 weeks (around 03/31/2023) for hair thinning.  I, Asher Muir, CMA, am acting as scribe for Willeen Niece, MD.   Documentation: I have reviewed the above documentation for accuracy and completeness, and I agree with the above.  Willeen Niece, MD

## 2023-04-01 ENCOUNTER — Encounter: Payer: Self-pay | Admitting: Neurosurgery

## 2023-04-01 ENCOUNTER — Ambulatory Visit: Payer: Medicare Other | Admitting: Neurosurgery

## 2023-04-01 VITALS — BP 117/71 | Ht 66.0 in | Wt 139.0 lb

## 2023-04-01 DIAGNOSIS — M5442 Lumbago with sciatica, left side: Secondary | ICD-10-CM

## 2023-04-01 DIAGNOSIS — G894 Chronic pain syndrome: Secondary | ICD-10-CM | POA: Diagnosis not present

## 2023-04-01 DIAGNOSIS — M4316 Spondylolisthesis, lumbar region: Secondary | ICD-10-CM

## 2023-04-01 DIAGNOSIS — M419 Scoliosis, unspecified: Secondary | ICD-10-CM | POA: Diagnosis not present

## 2023-04-01 NOTE — Progress Notes (Signed)
Referring Physician:  No referring provider defined for this encounter.  Primary Physician:  Mariam Dollar, MD  History of Present Illness: 04/01/2023 Her symptoms are relatively stable compared to when I last saw her.  She has a right thumb issue and is seeing Dr. Franklyn Lor at [facility].  She did not pursue spinal cord stimulator evaluation earlier this year.  She would like to consider this now.  10/29/2022 She presents today with continued symptoms.  She has had some foot trouble since I last saw her.  She also has a wound on her knee which is healing.  Her other symptoms are unchanged.  08/15/2022 Ms. Cerra Marotta is here today with a chief complaint of substantial back pain that is been ongoing for many years.  Is been worsening over time.  Her worst pain in her legs is in her left leg which extends below her knee in her left anterolateral calf.  Her pain is made worse by twisting, lifting, bending, standing, sitting, and changing positions.  Her activities of daily living are making her pain worse.  She has been undergoing injections for some time.  This has helped her, but has not given her long-term relief.  She is able to do most things she needs to do, but has significant pain by the end of the day.  It can be as bad as 10 out of 10.   Bowel/Bladder Dysfunction: none  Conservative measures:  Physical therapy:  has not participated in within the past year Multimodal medical therapy including regular antiinflammatories:  tylenol, tizanidine, celebrex, diclofenac Injections:  has received epidural steroid injections 06/28/2022: Bilateral S1 transforaminal ESI ( less relief ) 03/26/2022: Bilateral S1 transforaminal ESI (good relief) 12/19/2021: Bilateral S1 transforaminal ESI (good relief) 05/07/2021: Bilateral S1 transforaminal ESI (good relief, postinjection flare x1 day) 01/02/2021: Left S1 transforaminal ESI (good relief, postinjection flare x1 day) 06/29/2020: Left S1  transforaminal ESI (moderate to good relief) 04/25/2020: Left S1 transforaminal ESI (moderate to good relief) 02/22/2020: Left S1 transforaminal ESI (moderate relief) 12/28/2019: Left S1 transforaminal ESI (moderate relief) 10/14/2019: Left S1 transforaminal ESI (moderate relief) 08/11/2019: Left S1 transforaminal ESI (good relief for 2 to 3 days, then very gradual return of symptoms) 06/22/2019: Left S1 transforaminal ESI (20 to 30% relief, unable to cannulate the left L5-S1 foramen due to extensive degenerative changes)   Past Surgery:   Cervical Fusion in 2019 by Dr. Miguel Dibble has no symptoms of cervical myelopathy.  The symptoms are causing a significant impact on the patient's life.   I have utilized the care everywhere function in epic to review the outside records available from external health systems.  Review of Systems:  A 10 point review of systems is negative, except for the pertinent positives and negatives detailed in the HPI.  Past Medical History: Past Medical History:  Diagnosis Date   Actinic keratosis 11/29/2014   R ant lat neck - bx proven    Anemia 2008   required transfusion   Anxiety    Arthritis    back and hands   Basal cell carcinoma of hand    back, hx by Dr Orson Aloe in the 90s   Depression    GERD (gastroesophageal reflux disease)    H/O abnormal mammogram 2008   receiving annual MRIs, [facility]    H/O gastric ulcer 2008   High altitude sickness 2008   History of hiatal hernia    Neuromuscular disorder (HCC)  numbness in left shin and top of foot from back issues   Skin cancer 03/19/2016   SCCis, Right calf   Skin cancer 09/15/2018   SCC. Right upper calf   Squamous cell carcinoma of skin 03/19/2016   R calf - SCCIS   Squamous cell carcinoma of skin 09/15/2018   R upper calf     Squamous cell carcinoma of skin 04/03/2021   L medial calf, [facility]   Swelling    FOOT   Varicose vein of leg     Past Surgical  History: Past Surgical History:  Procedure Laterality Date   ABDOMINAL HYSTERECTOMY     BONE EXCISION Right 06/04/2018   Procedure: PARTIAL EXICSION BONE-PHALANX 5TH TOE;  Surgeon: Recardo Evangelist, DPM;  Location: [facility];  Service: Podiatry;  Laterality: Right;  LMA LOCAL   CARPAL TUNNEL RELEASE     [name]   CATARACT EXTRACTION W/PHACO Left 12/19/2015   Procedure: CATARACT EXTRACTION PHACO AND INTRAOCULAR LENS PLACEMENT (IOC);  Surgeon: Galen Manila, MD;  Location: [facility];  Service: Ophthalmology;  Laterality: Left;  Lot# [id number] H [place]: 00"37.5 AP%:20.7 CDE: 7.76    CATARACT EXTRACTION W/PHACO Right 02/13/2016   Procedure: CATARACT EXTRACTION PHACO AND INTRAOCULAR LENS PLACEMENT (IOC);  Surgeon: Galen Manila, MD;  Location: [facility];  Service: Ophthalmology;  Laterality: Right;  Lot #[id number] H [place]: 32.5 AP%: 40.8 CDE: 7.05   CERVICAL FUSION     C5 - 7    FOOT FUSION Left    TRIPLE FUSION    Allergies: Allergies as of 04/01/2023 - Review Complete 04/01/2023  Allergen Reaction Noted   Doxycycline Diarrhea and Nausea Only 05/29/2011   Erythromycin Diarrhea and Nausea Only 05/29/2011   Lidocaine  06/13/2017   Losartan Diarrhea 03/21/2015   Rosuvastatin  08/01/2014   Tetracyclines & related  12/06/2015   Thimerosal (thiomersal)  12/06/2015    Medications:  Current Outpatient Medications:    acetaminophen (TYLENOL) 500 MG tablet, Take 500 mg by mouth 2 (two) times daily. Patient states she is taking everyday, Disp: , Rfl:    ALPRAZolam (XANAX) 0.25 MG tablet, Take 1 tablet (0.25 mg total) by mouth at bedtime as needed for sleep. (Patient taking differently: Take 0.25 mg by mouth at bedtime as needed for sleep. TAKES 1/2 AS NEEDED), Disp: 30 tablet, Rfl: 5   ALTRENO 0.05 % LOTN, APPLY ONE APPLICATION TOPICALLY AT BEDTIME., Disp: 45 g, Rfl: 2   celecoxib (CELEBREX) 200 MG capsule, Take 1 capsule (200 mg total) by mouth daily., Disp: 90 capsule, Rfl: 2    estradiol (VIVELLE-DOT) 0.025 MG/24HR, Place 0.025 patches onto the skin once a week., Disp: , Rfl:    FLUoxetine (PROZAC) 20 MG tablet, Take one by mouth 3 times a week. (Patient taking differently: Take 20 mg by mouth daily.), Disp: 36 tablet, Rfl: 3   hydrochlorothiazide (MICROZIDE) 12.5 MG capsule, Take 12.5 mg by mouth daily., Disp: , Rfl:    Ivermectin (SOOLANTRA) 1 % CREA, Apply nightly. 45 grams per 30 days, Disp: 45 g, Rfl: 6   mupirocin ointment (BACTROBAN) 2 %, Apply 1 Application topically daily. qd to open wounds, Disp: 22 g, Rfl: 6   Pitavastatin Calcium 1 MG TABS, Take 1 mg by mouth., Disp: , Rfl:    tiZANidine (ZANAFLEX) 4 MG tablet, TAKE ONE TABLET EVERY 6 HOURS AS NEEDED FOR MUSCLE SPASMS, Disp: 60 tablet, Rfl: 2   traZODone (DESYREL) 50 MG tablet, Take 50 mg by mouth at bedtime., Disp: , Rfl:  zolpidem (AMBIEN CR) 12.5 MG CR tablet, TAKE 1 TABLET BY MOUTH AT BEDTIME AS NEEDED, Disp: 30 tablet, Rfl: 0  Social History: Social History   Tobacco Use   Smoking status: Former    Current packs/day: 0.00    Types: Cigarettes    Quit date: 05/29/1978    Years since quitting: 44.8   Smokeless tobacco: Former   Tobacco comments:    Quit 1980.  Substance Use Topics   Alcohol use: No   Drug use: No    Family Medical History: Family History  Problem Relation Age of Onset   Cancer Mother 45       bilateral breast , second age 18   COPD Father    Heart disease Father 77       died at 32    COPD Maternal Grandmother 74       pancreatic    Physical Examination: There were no vitals filed for this visit.   General: Patient is well developed, well nourished, calm, collected, and in no apparent distress. Attention to examination is appropriate.  Neck:   Supple.  Full range of motion.  Respiratory: Patient is breathing without any difficulty.   NEUROLOGICAL:     Awake, alert, oriented to person, place, and time.  Speech is clear and fluent.   Cranial Nerves: Pupils  equal round and reactive to light.  Facial tone is symmetric.  Facial sensation is symmetric. Shoulder shrug is symmetric. Tongue protrusion is midline.  There is no pronator drift.  Her posture shows stooped forward as well as slight coronal plane bend towards the right.  Strength: Side Biceps Triceps Deltoid Interossei Grip Wrist Ext. Wrist Flex.  R 5 5 5 5 5 5 5   L 5 5 5 5 5 5 5    Side Iliopsoas Quads Hamstring PF DF EHL  R 5 5 5 5 5 5   L 5 5 5 5 5 5    Reflexes are 1+ and symmetric at the biceps, triceps, brachioradialis, patella and achilles.   Hoffman's is absent.   Bilateral upper and lower extremity sensation is intact to light touch.    No evidence of dysmetria noted.  Gait is normal.     Medical Decision Making  Imaging: MRI L spine 03/07/2022 --T12-L1 = Mild narrowing of the spinal canal due to right paracentral  intervertebral disk bulge  --L1-L2 = Moderate narrowing of the spinal canal due to right facet  hypertrophy and right paracentral intervertebral disk bulge. Moderate  narrowing of the right L2 nerve root due to facet hypertrophy  --L2-L3 = Moderate narrowing of the spinal canal due to right facet  hypertrophy and right paracentral intervertebral disk bulge; severe  narrowing of the right L2 nerve root due to facet hypertrophy  --L3-L4 = Mild narrowing of the spinal canal due to right paracentral  intervertebral disk bulge; severe narrowing of the left L3 nerve root due  to facet hypertrophy  --L4-L5 = Severe facet hypertrophy and severe narrowing of the left L4  nerve root due to facet hypertrophy  --L5-S1 = Grade 1 anterolisthesis of L5 on S1 producing moderate bilateral  foraminal narrowing. Prominent facet degenerative disease.   IMPRESSION: Multiple bilateral sites of neuroforaminal narrowing and facet  hypertrophy related to levoscoliosis without substantial change from prior  study    Electronically Signed by:  Loreli Slot, MD, [facility]  Electronically Signed on:  03/07/2022 5:22 PM   Lumbar spine x-rays from January 28, 2022 shows coronal plane  Cobb angle of 33 degrees from L1-L4.  Lumbar lordosis is 34 degrees approximately.  Lumbar spine x-rays from June 06, 2015 show coronal plane Cobb angle of 20 degrees and lumbar lordosis of 47 degrees approximately.  These measurements were made in similar fashion to the ones mentioned above.  MRI T spine 11/09/2022 IMPRESSION: 1. Diffusely patent spinal canal. 2. Thoracic spine degeneration especially affects facets with levels of foraminal narrowing and greatest ligamentum flavum thickening described above.     Electronically Signed   By: Tiburcio Pea M.D.   On: 11/20/2022 04:48  I have personally reviewed the images and agree with the above interpretation.  Assessment and Plan: Ms. Newlon is a pleasant 72 y.o. female with low back pain causing sciatica of the left side, acquired scoliosis, and spondylolisthesis.  She is having significant symptoms from this.  She has chronic pain syndrome.  She would like to discuss spinal cord stimulator evaluation with Dr. Cherylann Ratel.  We will arrange this for her.  She is already done a psychology evaluation and thoracic spine MRI scan.      Jacorion Klem K. Myer Haff MD, [facility]

## 2023-04-21 ENCOUNTER — Telehealth: Payer: Self-pay

## 2023-04-21 NOTE — Telephone Encounter (Signed)
Patient called requesting refills of Clobetasol for itchy eczema spots on the arms and legs. Dr. Neale Burly prescribed it for her a few years ago (see 04/10/21 telephone call message), Ok to refill. Patient's preferred pharmacy is [facility].

## 2023-04-24 MED ORDER — CLOBETASOL PROPIONATE 0.05 % EX OINT: TOPICAL_OINTMENT | CUTANEOUS | 1 refills | Status: DC

## 2023-04-24 NOTE — Telephone Encounter (Signed)
 Clobetasol ointment sent to [facility].

## 2023-04-28 ENCOUNTER — Ambulatory Visit (INDEPENDENT_AMBULATORY_CARE_PROVIDER_SITE_OTHER): Payer: Self-pay | Admitting: Dermatology

## 2023-04-28 DIAGNOSIS — L719 Rosacea, unspecified: Secondary | ICD-10-CM

## 2023-04-28 DIAGNOSIS — L814 Other melanin hyperpigmentation: Secondary | ICD-10-CM

## 2023-04-28 DIAGNOSIS — L988 Other specified disorders of the skin and subcutaneous tissue: Secondary | ICD-10-CM

## 2023-04-28 NOTE — Patient Instructions (Addendum)
 Recommend  Normand Sloop, M.D. [address] [place], [place] [address] [phone]  [email or web address]    Due to recent changes in healthcare laws, you may see results of your pathology and/or laboratory studies on MyChart before the doctors have had a chance to review them. We understand that in some cases there may be results that are confusing or concerning to you. Please understand that not all results are received at the same time and often the doctors may need to interpret multiple results in order to provide you with the best plan of care or course of treatment. Therefore, we ask that you please give [place] 2 business days to thoroughly review all your results before contacting the office for clarification. Should we see a critical lab result, you will be contacted sooner.   If You Need Anything After Your Visit  If you have any questions or concerns for your doctor, please call our main line at [phone] and press option 4 to reach your doctor's medical assistant. If no one answers, please leave a voicemail as directed and we will return your call as soon as possible. Messages left after 4 pm will be answered the following business day.   You may also send [place] a message via MyChart. We typically respond to MyChart messages within 1-2 business days.  For prescription refills, please ask your pharmacy to contact our office. Our fax number is [phone].  If you have an urgent issue when the clinic is closed that cannot wait until the next business day, you can page your doctor at the number below.    Please note that while we do our best to be available for urgent issues outside of office hours, we are not available 24/7.   If you have an urgent issue and are unable to reach [place], you may choose to seek medical care at your doctor's office, retail clinic, urgent care center, or emergency room.  If you have a medical emergency, please immediately call 911 or go to  the emergency department.  Pager Numbers  - Dr. Gwen Pounds: [phone]  - Dr. Roseanne Reno: [phone]  - Dr. Katrinka Blazing: [phone]   In the event of inclement weather, please call our main line at [phone] for an update on the status of any delays or closures.  Dermatology Medication Tips: Please keep the boxes that topical medications come in in order to help keep track of the instructions about where and how to use these. Pharmacies typically print the medication instructions only on the boxes and not directly on the medication tubes.   If your medication is too expensive, please contact our office at [phone] option 4 or send [place] a message through MyChart.   We are unable to tell what your co-pay for medications will be in advance as this is different depending on your insurance coverage. However, we may be able to find a substitute medication at lower cost or fill out paperwork to get insurance to cover a needed medication.   If a prior authorization is required to get your medication covered by your insurance company, please allow [place] 1-2 business days to complete this process.  Drug prices often vary depending on where the prescription is filled and some pharmacies may offer cheaper prices.  The website www.goodrx.com contains coupons for medications through different pharmacies. The prices here do not account for what the cost may be with help from insurance (it may be cheaper with your insurance), but the website can give  you the price if you did not use any insurance.  - You can print the associated coupon and take it with your prescription to the pharmacy.  - You may also stop by our office during regular business hours and pick up a GoodRx coupon card.  - If you need your prescription sent electronically to a different pharmacy, notify our office through [facility] or by phone at [phone] option 4.     Si Usted Necesita Algo Despus de Su Visita  Tambin  puede enviarnos un mensaje a travs de Clinical cytogeneticist. Por lo general respondemos a los mensajes de MyChart en el transcurso de 1 a 2 das hbiles.  Para renovar recetas, por favor pida a su farmacia que se ponga en contacto con nuestra oficina. Annie Sable de fax es [place] [phone].  Si tiene un asunto urgente cuando la clnica est cerrada y que no puede esperar hasta el siguiente da hbil, puede llamar/localizar a su doctor(a) al nmero que aparece a continuacin.   Por favor, tenga en cuenta que aunque hacemos todo lo posible para estar disponibles para asuntos urgentes fuera del horario de [place], no estamos disponibles las 24 horas del da, los 7 [address]  [address] de la [place].   Si tiene un problema urgente y no puede comunicarse con nosotros, puede optar por buscar atencin mdica  en el consultorio de su doctor(a), en una clnica privada, en un centro de atencin urgente o en una sala de emergencias.  Si tiene Engineer, drilling, por favor llame inmediatamente al 911 o vaya a la sala de emergencias.  Nmeros de bper  - Dr. Gwen Pounds: [phone]  - Dra. Roseanne Reno: [phone]  - Dr. Katrinka Blazing: [phone]   En caso de inclemencias del tiempo, por favor llame a Lacy Duverney principal al [phone] para una actualizacin sobre el [place] de cualquier retraso o cierre.  Consejos para la medicacin en dermatologa: Por favor, guarde las cajas en las que vienen los medicamentos de uso tpico para ayudarle a seguir las instrucciones sobre dnde y cmo usarlos. Las farmacias generalmente imprimen las instrucciones del medicamento slo en las cajas y no directamente en los tubos del [place].   Si su medicamento es muy caro, por favor, pngase en contacto con Rolm Gala llamando al [phone] y presione la opcin 4 o envenos un mensaje a travs de Clinical cytogeneticist.   No podemos decirle cul ser su copago por los medicamentos por adelantado ya que esto es diferente dependiendo de la cobertura de su  seguro. Sin embargo, es posible que podamos encontrar un medicamento sustituto a Audiological scientist un formulario para que el seguro cubra el medicamento que se considera necesario.   Si se requiere una autorizacin previa para que su compaa de seguros [place] su medicamento, por favor permtanos de 1 a 2 das hbiles para completar [address]  Los precios de los medicamentos varan con frecuencia dependiendo del Environmental consultant de dnde se surte la receta y alguna farmacias pueden ofrecer precios ms baratos.  El sitio web www.goodrx.com tiene cupones para medicamentos de Health and safety inspector. Los precios aqu no tienen en cuenta lo que podra costar con la ayuda del seguro (puede ser ms barato con su seguro), pero el sitio web puede darle el precio si no utiliz Tourist information centre manager.  - Puede imprimir el cupn correspondiente y llevarlo con su receta a la farmacia.  - Tambin puede pasar por nuestra oficina durante el horario de atencin regular y Education officer, museum una tarjeta de cupones de GoodRx.  -  Si necesita que su receta se enve electrnicamente a Psychiatrist, informe a nuestra oficina a travs de MyChart de [address] o por telfono llamando al [phone] y presione la opcin 4.

## 2023-04-28 NOTE — Progress Notes (Signed)
 Follow-Up Visit   Subjective  Jessica Haas is a 73 y.o. female who presents for the following: BBL treatment to the face.   The following portions of the chart were reviewed this encounter and updated as appropriate: medications, allergies, medical history  Review of Systems:  No other skin or systemic complaints except as noted in HPI or Assessment and Plan.  Objective  Well appearing patient in no apparent distress; mood and affect are within normal limits.  A focused examination was performed of the following areas: face  Relevant exam findings are noted in the Assessment and Plan.  face Scattered tan macules.   face Mid face erythema with telangiectasias.   Assessment & Plan     LENTIGINES face Photorejuvenation - face Prior to the procedure, the patient's past medical history, medications, allergies, and the rare but potential risks and complications were reviewed with the patient and a signed consent was obtained.  Pre and post treatment care was discussed and instructions provided.   [place] BBL - 04/28/23 1400      Patient Details   Current Skin Care: face    Skin Type: I    Anesthestic Cream Applied: No    Photo Takes: No    Consent Signed: Yes    Improvement from Previous Treatment: Yes      Treatment Details   Date: 04/28/23    Treatment #: 3    Area: f    Filter: 1st Pass; 2nd Pass; 3rd Pass; 4th Pass      1st Pass   Location: F    Device: 515 Filter    BBL j/cm2: 12    PW Msec Sec: 10    Cooling Temp: 25    Pulses: 122    15x45: This crystal used.      2nd Pass   Location: F   mid face   Device: 515    BBL j/cm2: 12    PW Msec Sec: 10    Cooling Temp: 25    Pulses: 105    15x15: This crystal used      3rd Pass   Location: F   Spot tx pigmented areas on cheeks, temples, forehead.   Device: 560    BBL j/cm2: 12    PW Msec Sec: 5    Cooling Temp: 25    Pulses: 36    11mm: This crystal used      Laser safety: Patient was  advised in laser safety.  Patient was fitted with laser safety goggles and advised to keep eyes closed during procedure with goggles on. Staff and provider ensured that patient and their own safety goggles were also on and eyes protected during procedure. Laser room door was secured and locked from the inside. Laser room door has laser safety sign affixed to the outside of the door.  Patient tolerated the procedure well.   [place] avoidance was stressed. The patient will call with any problems, questions or concerns prior to their next appointment.   ROSACEA face Rosacea is a chronic progressive skin condition usually affecting the face of adults, causing redness and/or acne bumps. It is treatable but not curable. It sometimes affects the eyes (ocular rosacea) as well. It may respond to topical and/or systemic medication and can flare with stress, sun exposure, alcohol, exercise, topical steroids (including hydrocortisone/cortisone 10) and some foods.  Daily application of broad spectrum spf 30+ sunscreen to face is recommended to reduce flares. Photorejuvenation - face Prior to the  procedure, the patient's past medical history, medications, allergies, and the rare but potential risks and complications were reviewed with the patient and a signed consent was obtained.  Pre and post treatment care was discussed and instructions provided.   [place] BBL - 04/28/23 1400      Patient Details   Current Skin Care: face    Skin Type: I    Anesthestic Cream Applied: No    Photo Takes: No    Consent Signed: Yes    Improvement from Previous Treatment: Yes      Treatment Details   Date: 04/28/23    Treatment #: 3    Area: f    Filter: 1st Pass; 2nd Pass; 3rd Pass; 4th Pass       4th Pass   Location: F    Device: 560    BBL j/cm2: 17    PW Msec Sec: 15    Cooling Temp: 20    Pulses: 112    15x45: --    15x15: This crystal used    Patient tolerated the procedure well.   [place] avoidance was  stressed. The patient will call with any problems, questions or concerns prior to their next appointment.    FACIAL ELASTOSIS Exam: Rhytides and volume loss face.  Treatment Plan: Recommend patient see Beverley Leeroy Free, M.D. for filler to the mid face, mid lower face, and temples. [address] [place], [place] [address] [phone]  [email or web address]   Recommend daily broad spectrum sunscreen SPF 30+ to sun-exposed areas, reapply every 2 hours as needed. Call for new or changing lesions.  Staying in the shade or wearing long sleeves, sun glasses (UVA+UVB protection) and wide brim hats (4-inch brim around the entire circumference of the hat) are also recommended for sun protection.   Return as scheduled.  IAndrea Kerns, CMA, am acting as scribe for Rexene Rattler, MD .   Documentation: I have reviewed the above documentation for accuracy and completeness, and I agree with the above.  Rexene Rattler, MD

## 2023-05-05 ENCOUNTER — Ambulatory Visit (INDEPENDENT_AMBULATORY_CARE_PROVIDER_SITE_OTHER): Payer: Self-pay | Admitting: Dermatology

## 2023-05-05 DIAGNOSIS — L988 Other specified disorders of the skin and subcutaneous tissue: Secondary | ICD-10-CM

## 2023-05-05 NOTE — Patient Instructions (Addendum)
 Recommend  Normand Sloop, M.D. [address] [place], [place] [address] [phone]  [email or web address]    Due to recent changes in healthcare laws, you may see results of your pathology and/or laboratory studies on MyChart before the doctors have had a chance to review them. We understand that in some cases there may be results that are confusing or concerning to you. Please understand that not all results are received at the same time and often the doctors may need to interpret multiple results in order to provide you with the best plan of care or course of treatment. Therefore, we ask that you please give [place] 2 business days to thoroughly review all your results before contacting the office for clarification. Should we see a critical lab result, you will be contacted sooner.   If You Need Anything After Your Visit  If you have any questions or concerns for your doctor, please call our main line at [phone] and press option 4 to reach your doctor's medical assistant. If no one answers, please leave a voicemail as directed and we will return your call as soon as possible. Messages left after 4 pm will be answered the following business day.   You may also send [place] a message via MyChart. We typically respond to MyChart messages within 1-2 business days.  For prescription refills, please ask your pharmacy to contact our office. Our fax number is [phone].  If you have an urgent issue when the clinic is closed that cannot wait until the next business day, you can page your doctor at the number below.    Please note that while we do our best to be available for urgent issues outside of office hours, we are not available 24/7.   If you have an urgent issue and are unable to reach [place], you may choose to seek medical care at your doctor's office, retail clinic, urgent care center, or emergency room.  If you have a medical emergency, please immediately call 911 or go to  the emergency department.  Pager Numbers  - Dr. Gwen Pounds: [phone]  - Dr. Roseanne Reno: [phone]  - Dr. Katrinka Blazing: [phone]   In the event of inclement weather, please call our main line at [phone] for an update on the status of any delays or closures.  Dermatology Medication Tips: Please keep the boxes that topical medications come in in order to help keep track of the instructions about where and how to use these. Pharmacies typically print the medication instructions only on the boxes and not directly on the medication tubes.   If your medication is too expensive, please contact our office at [phone] option 4 or send [place] a message through MyChart.   We are unable to tell what your co-pay for medications will be in advance as this is different depending on your insurance coverage. However, we may be able to find a substitute medication at lower cost or fill out paperwork to get insurance to cover a needed medication.   If a prior authorization is required to get your medication covered by your insurance company, please allow [place] 1-2 business days to complete this process.  Drug prices often vary depending on where the prescription is filled and some pharmacies may offer cheaper prices.  The website www.goodrx.com contains coupons for medications through different pharmacies. The prices here do not account for what the cost may be with help from insurance (it may be cheaper with your insurance), but the website can give  you the price if you did not use any insurance.  - You can print the associated coupon and take it with your prescription to the pharmacy.  - You may also stop by our office during regular business hours and pick up a GoodRx coupon card.  - If you need your prescription sent electronically to a different pharmacy, notify our office through [facility] or by phone at [phone] option 4.     Si Usted Necesita Algo Despus de Su Visita  Tambin  puede enviarnos un mensaje a travs de Clinical cytogeneticist. Por lo general respondemos a los mensajes de MyChart en el transcurso de 1 a 2 das hbiles.  Para renovar recetas, por favor pida a su farmacia que se ponga en contacto con nuestra oficina. Annie Sable de fax es [place] [phone].  Si tiene un asunto urgente cuando la clnica est cerrada y que no puede esperar hasta el siguiente da hbil, puede llamar/localizar a su doctor(a) al nmero que aparece a continuacin.   Por favor, tenga en cuenta que aunque hacemos todo lo posible para estar disponibles para asuntos urgentes fuera del horario de [place], no estamos disponibles las 24 horas del da, los 7 [address]  [address] de la [place].   Si tiene un problema urgente y no puede comunicarse con nosotros, puede optar por buscar atencin mdica  en el consultorio de su doctor(a), en una clnica privada, en un centro de atencin urgente o en una sala de emergencias.  Si tiene Engineer, drilling, por favor llame inmediatamente al 911 o vaya a la sala de emergencias.  Nmeros de bper  - Dr. Gwen Pounds: [phone]  - Dra. Roseanne Reno: [phone]  - Dr. Katrinka Blazing: [phone]   En caso de inclemencias del tiempo, por favor llame a Lacy Duverney principal al [phone] para una actualizacin sobre el [place] de cualquier retraso o cierre.  Consejos para la medicacin en dermatologa: Por favor, guarde las cajas en las que vienen los medicamentos de uso tpico para ayudarle a seguir las instrucciones sobre dnde y cmo usarlos. Las farmacias generalmente imprimen las instrucciones del medicamento slo en las cajas y no directamente en los tubos del [place].   Si su medicamento es muy caro, por favor, pngase en contacto con Rolm Gala llamando al [phone] y presione la opcin 4 o envenos un mensaje a travs de Clinical cytogeneticist.   No podemos decirle cul ser su copago por los medicamentos por adelantado ya que esto es diferente dependiendo de la cobertura de su  seguro. Sin embargo, es posible que podamos encontrar un medicamento sustituto a Audiological scientist un formulario para que el seguro cubra el medicamento que se considera necesario.   Si se requiere una autorizacin previa para que su compaa de seguros [place] su medicamento, por favor permtanos de 1 a 2 das hbiles para completar [address]  Los precios de los medicamentos varan con frecuencia dependiendo del Environmental consultant de dnde se surte la receta y alguna farmacias pueden ofrecer precios ms baratos.  El sitio web www.goodrx.com tiene cupones para medicamentos de Health and safety inspector. Los precios aqu no tienen en cuenta lo que podra costar con la ayuda del seguro (puede ser ms barato con su seguro), pero el sitio web puede darle el precio si no utiliz Tourist information centre manager.  - Puede imprimir el cupn correspondiente y llevarlo con su receta a la farmacia.  - Tambin puede pasar por nuestra oficina durante el horario de atencin regular y Education officer, museum una tarjeta de cupones de GoodRx.  -  Si necesita que su receta se enve electrnicamente a Psychiatrist, informe a nuestra oficina a travs de MyChart de [address] o por telfono llamando al [phone] y presione la opcin 4.

## 2023-05-05 NOTE — Progress Notes (Signed)
   Follow-Up Visit   Subjective  Jessica Haas is a 73 y.o. female who presents for the following: Botox for facial elastosis  The following portions of the chart were reviewed this encounter and updated as appropriate: medications, allergies, medical history  Review of Systems:  No other skin or systemic complaints except as noted in HPI or Assessment and Plan.  Objective  Well appearing patient in no apparent distress; mood and affect are within normal limits.  A focused examination was performed of the face.  Relevant physical exam findings are noted in the Assessment and Plan.  Injection map photo     Assessment & Plan    Facial Elastosis  Location: See attached image  Informed consent: Discussed risks (infection, pain, bleeding, bruising, swelling, allergic reaction, paralysis of nearby muscles, eyelid droop, double vision, neck weakness, difficulty breathing, headache, undesirable cosmetic result, and need for additional treatment) and benefits of the procedure, as well as the alternatives.  Informed consent was obtained.  Preparation: The area was cleansed with alcohol.  Procedure Details:  Botox was injected into the dermis with a 30-gauge needle. Pressure applied to any bleeding. Ice packs offered for swelling.  Lot Number:  [id number] Expiration:  02/2025  Total Units Injected:  70  Plan: Tylenol  may be used for headache.  Allow 2 weeks before returning to clinic for additional dosing as needed. Patient will call for any problems.  Return as scheduled.  IAndrea Kerns, CMA, am acting as scribe for Rexene Rattler, MD .   Documentation: I have reviewed the above documentation for accuracy and completeness, and I agree with the above.  Rexene Rattler, MD

## 2023-05-06 ENCOUNTER — Ambulatory Visit
Payer: Medicare Other | Attending: Student in an Organized Health Care Education/Training Program | Admitting: Student in an Organized Health Care Education/Training Program

## 2023-05-06 ENCOUNTER — Encounter: Payer: Self-pay | Admitting: Student in an Organized Health Care Education/Training Program

## 2023-05-06 ENCOUNTER — Ambulatory Visit
Admission: RE | Admit: 2023-05-06 | Discharge: 2023-05-06 | Disposition: A | Discharge status: Home / Self Care | Payer: Medicare Other | Source: Ambulatory Visit | Attending: Student in an Organized Health Care Education/Training Program | Admitting: Student in an Organized Health Care Education/Training Program

## 2023-05-06 VITALS — BP 112/79 | HR 88 | Temp 97.2°F | Resp 20 | Ht 65.0 in | Wt 139.0 lb

## 2023-05-06 DIAGNOSIS — M5136 Other intervertebral disc degeneration, lumbar region with discogenic back pain only: Secondary | ICD-10-CM | POA: Diagnosis present

## 2023-05-06 DIAGNOSIS — M47816 Spondylosis without myelopathy or radiculopathy, lumbar region: Secondary | ICD-10-CM | POA: Diagnosis not present

## 2023-05-06 NOTE — Progress Notes (Signed)
 Safety precautions to be maintained throughout the outpatient stay will include: orient to surroundings, keep bed in low position, maintain call bell within reach at all times, provide assistance with transfer out of bed and ambulation.

## 2023-05-06 NOTE — Patient Instructions (Signed)
 ______________________________________________________________________    General Risks and Possible Complications  Patient Responsibilities: It is important that you read this as it is part of your informed consent. It is our duty to inform you of the risks and possible complications associated with treatments offered to you. It is your responsibility as a patient to read this and to ask questions about anything that is not clear or that you believe was not covered in this document.  Patient's Rights: You have the right to refuse treatment. You also have the right to change your mind, even after initially having agreed to have the treatment done. However, under this last option, if you wait until the last second to change your mind, you may be charged for the materials used up to that point.  Introduction: Medicine is not an Visual merchandiser. Everything in Medicine, including the lack of treatment(s), carries the potential for danger, harm, or loss (which is by definition: Risk). In Medicine, a complication is a secondary problem, condition, or disease that can aggravate an already existing one. All treatments carry the risk of possible complications. The fact that a side effects or complications occurs, does not imply that the treatment was conducted incorrectly. It must be clearly understood that these can happen even when everything is done following the highest safety standards.  No treatment: You can choose not to proceed with the proposed treatment alternative. The "PRO(s)" would include: avoiding the risk of complications associated with the therapy. The "CON(s)" would include: not getting any of the treatment benefits. These benefits fall under one of three categories: diagnostic; therapeutic; and/or palliative. Diagnostic benefits include: getting information which can ultimately lead to improvement of the disease or symptom(s). Therapeutic benefits are those associated with the successful  treatment of the disease. Finally, palliative benefits are those related to the decrease of the primary symptoms, without necessarily curing the condition (example: decreasing the pain from a flare-up of a chronic condition, such as incurable terminal cancer).  General Risks and Complications: These are associated to most interventional treatments. They can occur alone, or in combination. They fall under one of the following six (6) categories: no benefit or worsening of symptoms; bleeding; infection; nerve damage; allergic reactions; and/or death. No benefits or worsening of symptoms: In Medicine there are no guarantees, only probabilities. No healthcare provider can ever guarantee that a medical treatment will work, they can only state the probability that it may. Furthermore, there is always the possibility that the condition may worsen, either directly, or indirectly, as a consequence of the treatment. Bleeding: This is more common if the patient is taking a blood thinner, either prescription or over the counter (example: Goody Powders, Fish oil, Aspirin, Garlic, etc.), or if suffering a condition associated with impaired coagulation (example: Hemophilia, cirrhosis of the liver, low platelet counts, etc.). However, even if you do not have one on these, it can still happen. If you have any of these conditions, or take one of these drugs, make sure to notify your treating physician. Infection: This is more common in patients with a compromised immune system, either due to disease (example: diabetes, cancer, human immunodeficiency virus [HIV], etc.), or due to medications or treatments (example: therapies used to treat cancer and rheumatological diseases). However, even if you do not have one on these, it can still happen. If you have any of these conditions, or take one of these drugs, make sure to notify your treating physician. Nerve Damage: This is more common when the treatment is  an invasive one, but it  can also happen with the use of medications, such as those used in the treatment of cancer. The damage can occur to small secondary nerves, or to large primary ones, such as those in the spinal cord and brain. This damage may be temporary or permanent and it may lead to impairments that can range from temporary numbness to permanent paralysis and/or brain death. Allergic Reactions: Any time a substance or material comes in contact with our body, there is the possibility of an allergic reaction. These can range from a mild skin rash (contact dermatitis) to a severe systemic reaction (anaphylactic reaction), which can result in death. Death: In general, any medical intervention can result in death, most of the time due to an unforeseen complication. ______________________________________________________________________      ______________________________________________________________________    Preparing for your procedure  Appointments: If you think you may not be able to keep your appointment, call 24-48 hours in advance to cancel. We need time to make it available to others.  Procedure visits are for procedures only. During your procedure appointment there will be: NO Prescription Refills*. NO medication changes or discussions*. NO discussion of disability issues*. NO unrelated pain problem evaluations*. NO evaluations to order other pain procedures*. *These will be addressed at a separate and distinct evaluation encounter on the provider's evaluation schedule and not during procedure days.  Instructions: Food intake: Avoid eating anything solid for at least 8 hours prior to your procedure. Clear liquid intake: You may take clear liquids such as water up to 2 hours prior to your procedure. (No carbonated drinks. No soda.) Transportation: Unless otherwise stated by your physician, bring a driver. (Driver cannot be a Market researcher, Pharmacist, community, or any other form of public transportation.) Morning  Medicines: Except for blood thinners, take all of your other morning medications with a sip of water. Make sure to take your heart and blood pressure medicines. If your blood pressure's lower number is above 100, the case will be rescheduled. Blood thinners: Make sure to stop your blood thinners as instructed.  If you take a blood thinner, but were not instructed to stop it, call our office [phone] and ask to talk to a nurse. Not stopping a blood thinner prior to certain procedures could lead to serious complications. Diabetics on insulin: Notify the staff so that you can be scheduled 1st case in the morning. If your diabetes requires high dose insulin, take only  of your normal insulin dose the morning of the procedure and notify the staff that you have done so. Preventing infections: Shower with an antibacterial soap the morning of your procedure.  Build-up your immune system: Take 1000 mg of Vitamin C with every meal (3 times a day) the day prior to your procedure. Antibiotics: Inform the nursing staff if you are taking any antibiotics or if you have any conditions that may require antibiotics prior to procedures. (Example: recent joint implants)   Pregnancy: If you are pregnant make sure to notify the nursing staff. Not doing so may result in injury to the fetus, including death.  Sickness: If you have a cold, fever, or any active infections, call and cancel or reschedule your procedure. Receiving steroids while having an infection may result in complications. Arrival: You must be in the facility at least 30 minutes prior to your scheduled procedure. Tardiness: Your scheduled time is also the cutoff time. If you do not arrive at least 15 minutes prior to your procedure, you will  be rescheduled.  Children: Do not bring any children with you. Make arrangements to keep them home. Dress appropriately: There is always a possibility that your clothing may get soiled. Avoid long dresses. Valuables:  Do not bring any jewelry or valuables.  Reasons to call and reschedule or cancel your procedure: (Following these recommendations will minimize the risk of a serious complication.) Surgeries: Avoid having procedures within 2 weeks of any surgery. (Avoid for 2 weeks before or after any surgery). Flu Shots: Avoid having procedures within 2 weeks of a flu shots or . (Avoid for 2 weeks before or after immunizations). Barium: Avoid having a procedure within 7-10 days after having had a radiological study involving the use of radiological contrast. (Myelograms, Barium swallow or enema study). Heart attacks: Avoid any elective procedures or surgeries for the initial 6 months after a "Myocardial Infarction" (Heart Attack). Blood thinners: It is imperative that you stop these medications before procedures. Let us know if you if you take any blood thinner.  Infection: Avoid procedures during or within two weeks of an infection (including chest colds or gastrointestinal problems). Symptoms associated with infections include: Localized redness, fever, chills, night sweats or profuse sweating, burning sensation when voiding, cough, congestion, stuffiness, runny nose, sore throat, diarrhea, nausea, vomiting, cold or Flu symptoms, recent or current infections. It is specially important if the infection is over the area that we intend to treat. Heart and lung problems: Symptoms that may suggest an active cardiopulmonary problem include: cough, chest pain, breathing difficulties or shortness of breath, dizziness, ankle swelling, uncontrolled high or unusually low blood pressure, and/or palpitations. If you are experiencing any of these symptoms, cancel your procedure and contact your primary care physician for an evaluation.  Remember:  Regular Business hours are:  Monday to Thursday 8:00 AM to 4:00 PM  Provider's Schedule: Delano Metz, MD:  Procedure days: Tuesday and Thursday 7:30 AM to 4:00 PM  Edward Jolly, MD:  Procedure days: Monday and Wednesday 7:30 AM to 4:00 PM Last  Updated: 04/01/2023 ______________________________________________________________________     Facet Blocks Patient Information  Description: The facets are joints in the spine between the vertebrae.  Like any joints in the body, facets can become irritated and painful.  Arthritis can also effect the facets.  By injecting steroids and local anesthetic in and around these joints, we can temporarily block the nerve supply to them.  Steroids act directly on irritated nerves and tissues to reduce selling and inflammation which often leads to decreased pain.  Facet blocks may be done anywhere along the spine from the neck to the low back depending upon the location of your pain.   After numbing the skin with local anesthetic (like Novocaine), a small needle is passed onto the facet joints under x-ray guidance.  You may experience a sensation of pressure while this is being done.  The entire block usually lasts about 15-25 minutes.   Conditions which may be treated by facet blocks:  Low back/buttock pain Neck/shoulder pain Certain types of headaches  Preparation for the injection:  Do not eat any solid food or dairy products within 8 hours of your appointment. You may drink clear liquid up to 3 hours before appointment.  Clear liquids include water, black coffee, juice or soda.  No milk or cream please. You may take your regular medication, including pain medications, with a sip of water before your appointment.  Diabetics should hold regular insulin (if taken separately) and take 1/2 normal NPH dose  the morning of the procedure.  Carry some sugar containing items with you to your appointment. A driver must accompany you and be prepared to drive you home after your procedure. Bring all your current medications with you. An IV may be inserted and sedation may be given at the discretion of the physician. A blood pressure  cuff, EKG and other monitors will often be applied during the procedure.  Some patients may need to have extra oxygen administered for a short period. You will be asked to provide medical information, including your allergies and medications, prior to the procedure.  We must know immediately if you are taking blood thinners (like Coumadin/Warfarin) or if you are allergic to IV iodine contrast (dye).  We must know if you could possible be pregnant.  Possible side-effects:  Bleeding from needle site Infection (rare, may require surgery) Nerve injury (rare) Numbness & tingling (temporary) Difficulty urinating (rare, temporary) Spinal headache (a headache worse with upright posture) Light-headedness (temporary) Pain at injection site (serveral days) Decreased blood pressure (rare, temporary) Weakness in arm/leg (temporary) Pressure sensation in back/neck (temporary)   Call if you experience:  Fever/chills associated with headache or increased back/neck pain Headache worsened by an upright position New onset, weakness or numbness of an extremity below the injection site Hives or difficulty breathing (go to the emergency room) Inflammation or drainage at the injection site(s) Severe back/neck pain greater than usual New symptoms which are concerning to you  Please note:  Although the local anesthetic injected can often make your back or neck feel good for several hours after the injection, the pain will likely return. It takes 3-7 days for steroids to work.  You may not notice any pain relief for at least one week.  If effective, we will often do a series of 2-3 injections spaced 3-6 weeks apart to maximally decrease your pain.  After the initial series, you may be a candidate for a more permanent nerve block of the facets.  If you have any questions, please call [phone] [facility]

## 2023-05-06 NOTE — Progress Notes (Signed)
 Patient: Jessica Haas  Service Category: E/M  Provider: Wallie Sherry, MD  DOB: Nov 06, 1950  DOS: 05/06/2023  Referring Provider: Clois Fret, MD  MRN: [id number]  Setting: Ambulatory outpatient  PCP: Jessica Arlean FALCON, MD  Type: New Patient  Specialty: Interventional Pain Management    Location: Office  Delivery: Face-to-face     Primary Reason(s) for Visit: Encounter for initial evaluation of one or more chronic problems (new to examiner) potentially causing chronic pain, and posing a threat to normal musculoskeletal function. (Level of risk: High) CC: Back Pain (Lumbar bilateral  scoliosis ), Neck Pain (Left s/p fusion C5,6,7.  ), Foot Pain (Right s/p triple fusion ), and Leg Pain (Left, referred pain )  HPI  [name] is a 73 y.o. year old, female patient, who comes for the first time to our practice referred by Jessica Fret, MD for our initial evaluation of her chronic pain. She has Insomnia, persistent; Family history of breast cancer in first degree relative; H/O abnormal mammogram; H/O gastric ulcer; Neutropenia (HCC); History of anemia; Routine general medical examination at a health care facility; Spinal stenosis, lumbar region, with neurogenic claudication; Hyperlipidemia, mild; White coat syndrome without hypertension; Lumbar spondylosis; and Degeneration of intervertebral disc of lumbar region with discogenic back pain on their problem list. Today she comes in for evaluation of her Back Pain (Lumbar bilateral  scoliosis ), Neck Pain (Left s/p fusion C5,6,7.  ), Foot Pain (Right s/p triple fusion ), and Leg Pain (Left, referred pain )  Pain Assessment: Location: Lower, Left, Right Back Radiating: back pain into left leg. Onset: More than a month ago Duration: Chronic pain Quality: Constant, Discomfort Severity: 6 /10 (subjective, self-reported pain score)  Effect on ADL: earlier in the day is better, no longer goes for long walks.  tries to stay active around the  house Timing: Constant Modifying factors: rest, off of her feet and curled up with lots of pillows BP: 112/79  HR: 88  Onset and Duration: Gradual and Present longer than 3 months Cause of pain: Surgery Severity: Getting worse, NAS-11 at its worse: 10/10, NAS-11 at its best: 5/10, NAS-11 now: 8/10, and NAS-11 on the average: 6/10 Timing: Afternoon, Night, During activity or exercise, and After activity or exercise Aggravating Factors: Kneeling, Lifiting, Prolonged sitting, Prolonged standing, Surgery made it worse, Twisting, Walking, Walking downhill, and unsure if surgery is what made it worse  Alleviating Factors: Stretching, Medications, Nerve blocks, Resting, Sitting, Sleeping, and Using a brace Associated Problems: Fatigue, Numbness, Spasms, and Tingling Quality of Pain: Aching, Burning, Dull, Exhausting, Getting longer, and Tiring Previous Examinations or Tests: CT scan, MRI scan, X-rays, Neurological evaluation, Neurosurgical evaluation, and Orthopedic evaluation Previous Treatments: Epidural steroid injections, Relaxation therapy, Strengthening exercises, Stretching exercises, and Trigger point injections  [name] is being evaluated for possible interventional pain management therapies for the treatment of her chronic pain.  Discussed the use of AI scribe software for clinical note transcription with the patient, who gave verbal consent to proceed.  History of Present Illness   The patient, a 73 year old with a complex history of back pain, presents for evaluation of a spinal cord stimulator. The back pain, which has been ongoing since the age of 73, is described as both localized and radiating, with numbness noted in the legs, particularly the left foot. The patient has a history of scoliosis, which has been deemed too complicated for surgical intervention by multiple surgeons.  The patient has undergone several procedures, including a C5 to  C7 fusion and a triple fusion in the  left foot, both performed around the age of 40 or 27. Despite these interventions, the patient continues to experience significant discomfort, with numbness and pain in the top of the left foot and the outside of the left shin. Some pain is also noted in the right foot, though it is less severe than the left.  The patient has a strong aversion to opioids and has managed her pain without them, even post-surgery. However, she expresses a sense of exhaustion with her current level of pain. The patient has received multiple epidural injections for pain management, but these have not provided sufficient relief.  The patient's physical activity is limited due to her back pain, which worsens with lifting, bending, and reaching. The patient also reports that her back gets tired. Despite these limitations, the patient is able to walk, albeit with some difficulty due to pain in the left foot.        Meds   Current Outpatient Medications:    acetaminophen  (TYLENOL ) 500 MG tablet, Take 500 mg by mouth 2 (two) times daily. Patient states she is taking everyday, Disp: , Rfl:    ALPRAZolam  (XANAX ) 0.25 MG tablet, Take 1 tablet (0.25 mg total) by mouth at bedtime as needed for sleep. (Patient taking differently: Take 0.25 mg by mouth at bedtime as needed for sleep. TAKES 1/2 AS NEEDED), Disp: 30 tablet, Rfl: 5   celecoxib  (CELEBREX ) 200 MG capsule, Take 1 capsule (200 mg total) by mouth daily., Disp: 90 capsule, Rfl: 2   FLUoxetine  (PROZAC ) 20 MG tablet, Take one by mouth 3 times a week. (Patient taking differently: Take 20 mg by mouth daily.), Disp: 36 tablet, Rfl: 3   gabapentin (NEURONTIN) 100 MG capsule, Take 100 mg by mouth at bedtime., Disp: , Rfl:    hydrochlorothiazide (MICROZIDE) 12.5 MG capsule, Take 12.5 mg by mouth daily., Disp: , Rfl:    Ivermectin  (SOOLANTRA ) 1 % CREA, Apply nightly. 45 grams per 30 days, Disp: 45 g, Rfl: 6   Pitavastatin Calcium 1 MG TABS, Take 1 mg by mouth., Disp: , Rfl:     tiZANidine  (ZANAFLEX ) 4 MG tablet, TAKE ONE TABLET EVERY 6 HOURS AS NEEDED FOR MUSCLE SPASMS, Disp: 60 tablet, Rfl: 2   traZODone (DESYREL) 50 MG tablet, Take 50 mg by mouth at bedtime., Disp: , Rfl:    zolpidem  (AMBIEN  CR) 12.5 MG CR tablet, TAKE 1 TABLET BY MOUTH AT BEDTIME AS NEEDED, Disp: 30 tablet, Rfl: 0   ALTRENO  0.05 % LOTN, APPLY ONE APPLICATION TOPICALLY AT BEDTIME., Disp: 45 g, Rfl: 2   clobetasol  ointment (TEMOVATE ) 0.05 %, Apply to aa's itchy rash QD-BID PRN. Avoid applying to face, groin, and axilla. Use as directed. Long-term use can cause thinning of the skin., Disp: 45 g, Rfl: 1   estradiol (VIVELLE-DOT) 0.025 MG/24HR, Place 0.025 patches onto the skin once a week., Disp: , Rfl:    mupirocin  ointment (BACTROBAN ) 2 %, Apply 1 Application topically daily. qd to open wounds, Disp: 22 g, Rfl: 6  Imaging Review  Cervical Imaging: Cervical MR wo contrast: Results for orders placed during the hospital encounter of 01/27/17  MR CERVICAL SPINE WO CONTRAST  Narrative CLINICAL DATA:  Neck and right arm pain for 1.5 years.  EXAM: MRI CERVICAL SPINE WITHOUT CONTRAST  TECHNIQUE: Multiplanar, multisequence MR imaging of the cervical spine was performed. No intravenous contrast was administered.  COMPARISON:  03/21/2016  FINDINGS: Alignment: Unchanged straightening of the normal cervical  lordosis. Unchanged slight retrolisthesis of C6 on C7 and slight anterolisthesis of C7 on T1.  Vertebrae: No fracture or suspicious osseous lesion. Unchanged mild degenerative endplate edema at R3-2.  Cord: Normal signal.  Posterior Fossa, vertebral arteries, paraspinal tissues: Unremarkable.  Disc levels:  C2-3:  Right facet ankylosis.  No disc herniation or stenosis.  C3-4: Uncovertebral spurring and mild-to-moderate right and severe left facet arthrosis result in mild right and moderate left neural foraminal stenosis without spinal stenosis, unchanged.  C4-5:  Left facet ankylosis.   No disc herniation or stenosis.  C5-6: Disc bulging, asymmetric left uncovertebral spurring, and moderate left greater than right facet arthrosis result in mild left neural foraminal stenosis without spinal stenosis, unchanged.  C6-7: Advanced disc degeneration with severe disc space narrowing. Broad-based posterior disc osteophyte complex asymmetric to the right results in moderate spinal stenosis with mild right-sided cord flattening and severe right neural foraminal stenosis with potential C7 nerve root impingement, unchanged.  C7-T1: Minimal disc bulging and severe right and moderate left facet arthrosis result in moderate right neural foraminal stenosis without spinal stenosis, unchanged.  IMPRESSION: Unchanged cervical disc and facet degeneration, most advanced at C6-7 where there is moderate spinal and severe right neural foraminal stenosis.   Electronically Signed By: Dasie Hamburg M.D. On: 01/27/2017 14:48   MR THORACIC SPINE WO CONTRAST  Narrative CLINICAL DATA:  Chronic pain syndrome. Ten years of low back pain radiating to the left shin. Preoperative planning scan for spinal cord stimulator.  EXAM: MRI THORACIC SPINE WITHOUT CONTRAST  TECHNIQUE: Multiplanar, multisequence MR imaging of the thoracic spine was performed. No intravenous contrast was administered.  COMPARISON:  None Available.  FINDINGS: Alignment: Mild facet mediated anterolisthesis at C7-T1 and T2-3 to T4-5.  Vertebrae: No evidence of fracture or aggressive bone lesion. There is a hemangioma in the T10 posterior elements.  Cord:  Normal cord signal.  No abnormal spinal canal collection.  Paraspinal and other soft tissues: No perispinal mass or inflammation seen. Partially covered left renal cyst at the upper pole measuring 3 cm with no worrisome features. No specific follow-up imaging is recommended.  Disc levels:  Diffuse degenerative facet spurring with hypertrophy greater on  the left. Associated levels of upper thoracic anterolisthesis as described above. Facet spurring causes advanced foraminal stenosis on the right at T2-3. At least moderate left foraminal narrowing at T2-3 and T11-12. Ligamentum flavum thickening encroaches on the posterior subarachnoid space especially at T2-3, T3-4, T10-11, and T11-12-although no spinal cord mass effect at any level.  IMPRESSION: 1. Diffusely patent spinal canal. 2. Thoracic spine degeneration especially affects facets with levels of foraminal narrowing and greatest ligamentum flavum thickening described above.   Electronically Signed By: Dorn Roulette M.D. On: 11/20/2022 04:48   Narrative CLINICAL DATA:  Low back pain with left leg pain and numbness  EXAM: MRI LUMBAR SPINE WITHOUT CONTRAST  TECHNIQUE: Multiplanar, multisequence MR imaging of the lumbar spine was performed. No intravenous contrast was administered.  COMPARISON:  Abdominal CT 01/02/2012  FINDINGS: Segmentation:  Standard lumbar numbering  Alignment: Significant levoscoliosis. Grade 1 anterolisthesis at L5-S1. Mild retrolisthesis at L2-3.  Vertebrae:  No fracture, evidence of discitis, or bone lesion.  Conus medullaris and cauda equina: Conus extends to the L1-2 level. Conus and cauda equina appear normal.  Paraspinal and other soft tissues: Small left renal cystic intensity.  Disc levels:  T11-12 left facet spurring and moderate foraminal impingement.  T12- L1: Unremarkable.  L1-L2: Advanced disc narrowing with asymmetric right-sided  collapse and preferential disc bulge and endplate spurring. Right facet spurring. Advanced, compressive right foraminal stenosis. Patent spinal canal  L2-L3: Advanced disc narrowing with asymmetric right-sided collapse and preferential disc bulge/endplate spurring. Bulky right facet spurring. Severe right foraminal impingement. Right subarticular recess narrowing without static  compression  L3-L4: Disc narrowing and bulging with downward pointing disc protrusion. Degenerative facet spurring greater on the right. Moderate bilateral foraminal stenosis. Bilateral subarticular recess narrowing without static compression  L4-L5: Disc narrowing with preferential left-sided collapse. There is a left foraminal to paracentral protrusion that is superiorly migrating and compresses the left L4 nerve root. There is severe compressive stenosis at the left foramen from facet spurring and disc bulging.  L5-S1:Degenerative facet spurring with anterolisthesis and mild disc bulging. No impingement  IMPRESSION: 1. Severe facet and disc degeneration with scoliosis and listheses. 2. Severe foraminal impingement on the right at L1-2, right at L2-3, and left at L4-5. 3. Notable left paracentral protrusion superiorly migrating at L4-5 and compressing the L4 nerve root. 4. T11-12 moderate left foraminal stenosis from facet spur.   Electronically Signed By: Cassondra Roulette M.D. On: 05/04/2018 08:19    Complexity Note: Imaging results reviewed.                         ROS  Cardiovascular: No reported cardiovascular signs or symptoms such as High blood pressure, coronary artery disease, abnormal heart rate or rhythm, heart attack, blood thinner therapy or heart weakness and/or failure Pulmonary or Respiratory: No reported pulmonary signs or symptoms such as wheezing and difficulty taking a deep full breath (Asthma), difficulty blowing air out (Emphysema), coughing up mucus (Bronchitis), persistent dry cough, or temporary stoppage of breathing during sleep Neurological: Abnormal skin sensations (Peripheral Neuropathy) and Curved spine Psychological-Psychiatric: Difficulty sleeping and or falling asleep Gastrointestinal: Vomiting blood (Ulcers), Heartburn due to stomach pushing into lungs (Hiatal hernia), and Irregular, infrequent bowel movements (Constipation) Genitourinary: No  reported renal or genitourinary signs or symptoms such as difficulty voiding or producing urine, peeing blood, non-functioning kidney, kidney stones, difficulty emptying the bladder, difficulty controlling the flow of urine, or chronic kidney disease Hematological: Weakness due to low blood hemoglobin or red blood cell count (Anemia) Endocrine: No reported endocrine signs or symptoms such as high or low blood sugar, rapid heart rate due to high thyroid  levels, obesity or weight gain due to slow thyroid  or thyroid  disease Rheumatologic: Joint aches and or swelling due to excess weight (Osteoarthritis) Musculoskeletal: Negative for myasthenia gravis, muscular dystrophy, multiple sclerosis or malignant hyperthermia Work History: Retired  Allergies  Ms. Hannibal is allergic to doxycycline, erythromycin, lidocaine , losartan, rosuvastatin, tetracyclines & related, and thimerosal (thiomersal).  Laboratory Chemistry Profile   Renal Lab Results  Component Value Date   BUN 19 04/27/2012   CREATININE 0.9 04/30/2018   GFR 79.26 04/27/2012   SPECGRAV 1.015 01/06/2013   PHUR 6.5 01/06/2013   PROTEINUR 100 01/06/2013     Electrolytes Lab Results  Component Value Date   NA 138 04/30/2018   K 4.1 04/30/2018   CL 105 04/27/2012   CALCIUM 9.1 04/27/2012     Hepatic Lab Results  Component Value Date   AST 29 04/30/2018   ALT 16 04/30/2018   ALBUMIN 4.1 04/27/2012   ALKPHOS 46 04/30/2018     ID No results found for: LYMEIGGIGMAB, HIV, SARSCOV2NAA, STAPHAUREUS, MRSAPCR, HCVAB, PREGTESTUR, RMSFIGG, QFVRPH1IGG, QFVRPH2IGG   Bone Lab Results  Component Value Date   VD25OH 29 04/30/2018  Endocrine Lab Results  Component Value Date   GLUCOSE 80 04/27/2012   TSH 1.13 04/30/2018     Neuropathy No results found for: VITAMINB12, FOLATE, HGBA1C, HIV   CNS No results found for: COLORCSF, APPEARCSF, RBCCOUNTCSF, WBCCSF, POLYSCSF, LYMPHSCSF, EOSCSF,  PROTEINCSF, GLUCCSF, JCVIRUS, CSFOLI, IGGCSF, LABACHR, ACETBL   Inflammation (CRP: Acute  ESR: Chronic) No results found for: CRP, ESRSEDRATE, LATICACIDVEN   Rheumatology No results found for: RF, ANA, LABURIC, URICUR, LYMEIGGIGMAB, LYMEABIGMQN, HLAB27   Coagulation Lab Results  Component Value Date   PLT 216.0 05/21/2012     Cardiovascular Lab Results  Component Value Date   HGB 11.4 (A) 04/30/2018   HCT 37.8 05/21/2012     Screening No results found for: SARSCOV2NAA, COVIDSOURCE, STAPHAUREUS, MRSAPCR, HCVAB, HIV, PREGTESTUR   Cancer No results found for: CEA, CA125, LABCA2   Allergens No results found for: ALMOND, APPLE, ASPARAGUS, AVOCADO, BANANA, BARLEY, BASIL, BAYLEAF, GREENBEAN, LIMABEAN, WHITEBEAN, BEEFIGE, REDBEET, BLUEBERRY, BROCCOLI, CABBAGE, MELON, CARROT, CASEIN, CASHEWNUT, CAULIFLOWER, CELERY     Note: Lab results reviewed.  PFSH  Drug: Ms. Neuwirth  reports no history of drug use. Alcohol:  reports no history of alcohol use. Tobacco:  reports that she quit smoking about 44 years ago. Her smoking use included cigarettes. She has quit using smokeless tobacco. Medical:  has a past medical history of Actinic keratosis (11/29/2014), Anemia (2008), Anxiety, Arthritis, Basal cell carcinoma of hand, Depression, GERD (gastroesophageal reflux disease), H/O abnormal mammogram (2008), H/O gastric ulcer (2008), High altitude sickness (2008), History of hiatal hernia, Neuromuscular disorder (HCC), Skin cancer (03/19/2016), Skin cancer (09/15/2018), Squamous cell carcinoma of skin (03/19/2016), Squamous cell carcinoma of skin (09/15/2018), Squamous cell carcinoma of skin (04/03/2021), Swelling, and Varicose vein of leg. Family: family history includes COPD in her father; COPD (age of onset: 59) in her maternal grandmother; Cancer (age of onset: 47) in her mother; Heart disease (age of  onset: 18) in her father.  Past Surgical History:  Procedure Laterality Date   ABDOMINAL HYSTERECTOMY     BONE EXCISION Right 06/04/2018   Procedure: PARTIAL EXICSION BONE-PHALANX 5TH TOE;  Surgeon: Lilli Cough, DPM;  Location: [facility];  Service: Podiatry;  Laterality: Right;  LMA LOCAL   CARPAL TUNNEL RELEASE     [facility]   CATARACT EXTRACTION W/PHACO Left 12/19/2015   Procedure: CATARACT EXTRACTION PHACO AND INTRAOCULAR LENS PLACEMENT (IOC);  Surgeon: Elsie Carmine, MD;  Location: [facility];  Service: Ophthalmology;  Laterality: Left;  Lot# [id number] H US : 0037.5 AP%:20.7 CDE: 7.76    CATARACT EXTRACTION W/PHACO Right 02/13/2016   Procedure: CATARACT EXTRACTION PHACO AND INTRAOCULAR LENS PLACEMENT (IOC);  Surgeon: Elsie Carmine, MD;  Location: [facility];  Service: Ophthalmology;  Laterality: Right;  Lot #[id number] H US : 32.5 AP%: 40.8 CDE: 7.05   CERVICAL FUSION     C5 - 7    FOOT FUSION Left    TRIPLE FUSION   Active Ambulatory Problems    Diagnosis Date Noted   Insomnia, persistent 05/29/2011   Family history of breast cancer in first degree relative 05/29/2011   H/O abnormal mammogram    H/O gastric ulcer    Neutropenia (HCC) 05/21/2012   History of anemia 05/21/2012   Routine general medical examination at a health care facility 05/21/2012   Spinal stenosis, lumbar region, with neurogenic claudication 12/30/2015   Hyperlipidemia, mild 12/30/2015   White coat syndrome without hypertension 06/24/2018   Lumbar spondylosis 05/06/2023   Degeneration of intervertebral disc of lumbar region with discogenic back pain  05/06/2023   Resolved Ambulatory Problems    Diagnosis Date Noted   Basal cell carcinoma of hand    High altitude sickness    Right lower quadrant abdominal pain 12/27/2011   Dysuria 01/06/2013   Cough 02/16/2013   Past Medical History:  Diagnosis Date   Actinic keratosis 11/29/2014   Anemia 2008   Anxiety    Arthritis    Depression     GERD (gastroesophageal reflux disease)    History of hiatal hernia    Neuromuscular disorder (HCC)    Skin cancer 03/19/2016   Skin cancer 09/15/2018   Squamous cell carcinoma of skin 03/19/2016   Squamous cell carcinoma of skin 09/15/2018   Squamous cell carcinoma of skin 04/03/2021   Swelling    Varicose vein of leg    Constitutional Exam  General appearance: Well nourished, well developed, and well hydrated. In no apparent acute distress Vitals:   05/06/23 1354  BP: 112/79  Pulse: 88  Resp: 20  Temp: (!) 97.2 F (36.2 C)  TempSrc: Temporal  SpO2: 100%  Weight: 139 lb (63 kg)  Height: 5' 5 (1.651 m)   BMI Assessment: Estimated body mass index is 23.13 kg/m as calculated from the following:   Height as of this encounter: 5' 5 (1.651 m).   Weight as of this encounter: 139 lb (63 kg).  BMI interpretation table: BMI level Category Range association with higher incidence of chronic pain  <18 kg/m2 Underweight   18.5-24.9 kg/m2 Ideal body weight   25-29.9 kg/m2 Overweight Increased incidence by 20%  30-34.9 kg/m2 Obese (Class I) Increased incidence by 68%  35-39.9 kg/m2 Severe obesity (Class II) Increased incidence by 136%  >40 kg/m2 Extreme obesity (Class III) Increased incidence by 254%   Patient's current BMI Ideal Body weight  Body mass index is 23.13 kg/m. Ideal body weight: 57 kg (125 lb 10.6 oz) Adjusted ideal body weight: 59.4 kg (131 lb)   BMI Readings from Last 4 Encounters:  05/06/23 23.13 kg/m  04/01/23 22.44 kg/m  10/29/22 22.47 kg/m  08/15/22 21.90 kg/m   Wt Readings from Last 4 Encounters:  05/06/23 139 lb (63 kg)  04/01/23 139 lb (63 kg)  10/29/22 139 lb 3.2 oz (63.1 kg)  08/15/22 139 lb 12.8 oz (63.4 kg)    Psych/Mental status: Alert, oriented x 3 (person, place, & time)       Eyes: PERLA Respiratory: No evidence of acute respiratory distress  Thoracic Spine Area Exam  Skin & Axial Inspection: No masses, redness, or  swelling Alignment: Symmetrical Functional ROM: Pain restricted ROM Stability: No instability detected Muscle Tone/Strength: Functionally intact. No obvious neuro-muscular anomalies detected. Sensory (Neurological): Musculoskeletal pain pattern Muscle strength & Tone: No palpable anomalies Lumbar Spine Area Exam  Skin & Axial Inspection: Well healed scar from previous spine surgery detected Alignment: Symmetrical Functional ROM: Mechanically restricted ROM affecting both sides Stability: No instability detected Muscle Tone/Strength: Functionally intact. No obvious neuro-muscular anomalies detected. Sensory (Neurological): Dermatomal pain pattern and MSK Palpation: Complains of area being tender to palpation       Provocative Tests: Hyperextension/rotation test: (+) due to pain. Lumbar quadrant test (Kemp's test): (+) bilateral for foraminal stenosis Lateral bending test: (+) ipsilateral radicular pain, bilaterally. Positive for bilateral foraminal stenosis.  Gait & Posture Assessment  Ambulation: Unassisted Gait: Relatively normal for age and body habitus Posture: WNL  Lower Extremity Exam    Side: Right lower extremity  Side: Left lower extremity  Stability: No instability observed  Stability: No instability observed          Skin & Extremity Inspection: Skin color, temperature, and hair growth are WNL. No peripheral edema or cyanosis. No masses, redness, swelling, asymmetry, or associated skin lesions. No contractures.  Skin & Extremity Inspection: Skin color, temperature, and hair growth are WNL. No peripheral edema or cyanosis. No masses, redness, swelling, asymmetry, or associated skin lesions. No contractures.  Functional ROM: Unrestricted ROM                  Functional ROM: Unrestricted ROM                  Muscle Tone/Strength: Functionally intact. No obvious neuro-muscular anomalies detected.  Muscle Tone/Strength: Functionally intact. No obvious neuro-muscular  anomalies detected.  Sensory (Neurological): Unimpaired        Sensory (Neurological): Unimpaired        DTR: Patellar: deferred today Achilles: deferred today Plantar: deferred today  DTR: Patellar: deferred today Achilles: deferred today Plantar: deferred today  Palpation: No palpable anomalies  Palpation: No palpable anomalies    Assessment  Primary Diagnosis & Pertinent Problem List: The primary encounter diagnosis was Lumbar facet arthropathy. Diagnoses of Lumbar spondylosis and Degeneration of intervertebral disc of lumbar region with discogenic back pain were also pertinent to this visit.  Visit Diagnosis (New problems to examiner): 1. Lumbar facet arthropathy   2. Lumbar spondylosis   3. Degeneration of intervertebral disc of lumbar region with discogenic back pain    Plan of Care (Initial workup plan)  Note: Ms. Doten was reminded that as per protocol, today's visit has been an evaluation only. We have not taken over the patient's controlled substance management.  Problem-specific plan: Assessment and Plan    Chronic Lower Back Pain with Lumbar Radiculopathy & Facet Arthropathy Chronic lower back pain, present since age 27 and now complicated by scoliosis, causes pain radiating to the left foot and shin, with numbness and neuropathic pain. Previous treatments include lumbar MRIs, CT scans, and epidural injections. Due to high surgical risk from scoliosis, a spinal cord stimulator (SCS) trial is considered, with a preference for an open trial due to anatomical challenges. Risks and benefits of percutaneous and open trials were discussed, noting the difficulty accessing the epidural space and need for bone resection in an open trial. Alternative treatments include lumbar medial branch nerve blocks and radiofrequency ablation (RFA). Plan to consider diagnostic nerve blocks at L3, L4, and L5 bilaterally. Lauraine KATHEE Trudy has a history of greater than 3 months of moderate to severe  pain which is resulted in functional impairment.  The patient has tried various conservative therapeutic options such as NSAIDs, Tylenol , muscle relaxants, physical therapy which was inadequately effective.  Patient's pain is predominantly axial with physical exam and L-MRI findings suggestive of facet arthropathy. Lumbar facet medial branch nerve blocks were discussed with the patient.  Risks and benefits were reviewed.  Patient would like to proceed with bilateral L3, L4, L5 medial branch nerve block  I was able to evaluate her interlaminar spaces under live fluoroscopy.  Given her significant scoliosis, she has limited interlaminar windows which would include percutaneous spinal cord stimulator trial challenging.  Facet Arthritis Significant facet arthritis on MRI contributes to low back pain radiating to the top of the buttocks. Diagnostic nerve blocks will assess pain relief, with potential subsequent RFA if effective, which can provide relief for 6-12 months. Perform diagnostic nerve blocks at L3, L4, and L5 bilaterally. If effective, consider  RFA for long-term pain relief.  Post-Surgical Foot Pain Persistent pain and numbness in the left foot and shin follow a left foot triple fusion in 2018-2019, distinct from radiating leg pain due to back issues. Monitor and manage this pain as part of the overall treatment plan for back and leg pain.  General Health Maintenance Discussed using a TENS unit for non-pharmacologic pain management. Although not yet tried, there is openness to considering it. Recommend a trial of the TENS unit for pain management and educate on its use and procurement from [employer] or [address].  Follow-up Schedule a follow-up appointment after the nerve block procedure. Coordinate with insurance for procedure approval and ensure a driver is available for the procedure day.       Imaging Orders         DG PAIN CLINIC C-ARM 1-60 MIN NO REPORT      Procedure Orders          LUMBAR FACET(MEDIAL BRANCH NERVE BLOCK) MB[facility]     Provider-requested follow-up: No follow-ups on file.  Future Appointments  Date Time Provider Department Center  05/12/2023  3:30 PM Jackquline Sawyer, MD [facility] None  05/21/2023  1:00 PM Marcelino Nurse, MD [facility] None  06/02/2023  3:30 PM Jackquline Sawyer, MD [facility] None  06/30/2023  3:30 PM Jackquline Sawyer, MD [facility] None  08/04/2023  1:30 PM Jackquline Sawyer, MD [facility] None  08/27/2023  1:30 PM Hester Alm BROCKS, MD [facility] None  09/02/2023  1:30 PM Jackquline Sawyer, MD [facility] None  11/10/2023  1:30 PM Jackquline Sawyer, MD [facility] None    Duration of encounter: .  Total time on encounter, as per AMA guidelines included both the face-to-face and non-face-to-face time personally spent by the physician and/or other qualified health care professional(s) on the day of the encounter (includes time in activities that require the physician or other qualified health care professional and does not include time in activities normally performed by clinical staff). Physician's time may include the following activities when performed: Preparing to see the patient (e.g., pre-charting review of records, searching for previously ordered imaging, lab work, and nerve conduction tests) Review of prior analgesic pharmacotherapies. Reviewing PMP Interpreting ordered tests (e.g., lab work, imaging, nerve conduction tests) Performing post-procedure evaluations, including interpretation of diagnostic procedures Obtaining and/or reviewing separately obtained history Performing a medically appropriate examination and/or evaluation Counseling and educating the patient/family/caregiver Ordering medications, tests, or procedures Referring and communicating with other health care professionals (when not separately reported) Documenting clinical information in the electronic or other health record Independently interpreting results (not separately reported) and communicating  results to the patient/ family/caregiver Care coordination (not separately reported)  Note by: Nurse Marcelino, MD (AI and TTS technology used. I apologize for any typographical errors that were not detected and corrected.) Date: 05/06/2023; Time: 2:53 PM

## 2023-05-12 ENCOUNTER — Ambulatory Visit (INDEPENDENT_AMBULATORY_CARE_PROVIDER_SITE_OTHER): Payer: Self-pay | Admitting: Dermatology

## 2023-05-12 DIAGNOSIS — L814 Other melanin hyperpigmentation: Secondary | ICD-10-CM

## 2023-05-12 NOTE — Progress Notes (Signed)
   Follow-Up Visit   Subjective  Jessica Haas is a 73 y.o. female who presents for the following: BBL treatment for lentigines of the arms and hands.  The following portions of the chart were reviewed this encounter and updated as appropriate: medications, allergies, medical history  Review of Systems:  No other skin or systemic complaints except as noted in HPI or Assessment and Plan.  Objective  Well appearing patient in no apparent distress; mood and affect are within normal limits.  A focused examination was performed of the following areas: Arms  Relevant physical exam findings are noted in the Assessment and Plan.  b/l arms and hands Scattered tan macules.                  Assessment & Plan   LENTIGINES b/l arms and hands BBL treatment today. Patient had arms and hands treated years ago.  Photorejuvenation - b/l arms and hands Prior to the procedure, the patient's past medical history, medications, allergies, and the rare but potential risks and complications were reviewed with the patient and a signed consent was obtained.  Pre and post treatment care was discussed and instructions provided.   [place] BBL - 05/12/23 1500      Patient Details   Current Skin Care: arms    Skin Type: I    Anesthestic Cream Applied: No    Photo Takes: Yes    Consent Signed: Yes      Treatment Details   Date: 05/12/23    Treatment #: 1    Area: a    Filter: 1st Pass      1st Pass   Location: Other   L arm and hand   Device: 515 Filter    BBL j/cm2: 9    PW Msec Sec: 10    Cooling Temp: 15    Pulses: 304    15x45: This crystal used      2nd Pass   Location: Other   R arm and hand   Device: 515 Filter    BBL j/cm2: 9    PW Msec Sec: 10    Cooling Temp: 15    Pulses: 295    15x45: This crystal used      3rd Pass   Location: Other   spot treat - arms and hands   Device: 515 Filter    BBL j/cm2: 12    PW Msec Sec: 5    Cooling Temp: 25    Pulses: 129     11mm: This crystal used     Laser safety: Patient was advised in laser safety.  Patient was fitted with laser safety goggles and advised to keep eyes closed during procedure with goggles on. Staff and provider ensured that patient and their own safety goggles were also on and eyes protected during procedure. Laser room door was secured and locked from the inside. Laser room door has laser safety sign affixed to the outside of the door.   Patient tolerated the procedure well.   Wynelle Link avoidance was stressed. The patient will call with any problems, questions or concerns prior to their next appointment.     Return as scheduled.  ICherlyn Labella, CMA, am acting as scribe for Willeen Niece, MD .   Documentation: I have reviewed the above documentation for accuracy and completeness, and I agree with the above.  Willeen Niece, MD

## 2023-05-12 NOTE — Patient Instructions (Signed)

## 2023-05-21 ENCOUNTER — Ambulatory Visit: Payer: Medicare Other | Admitting: Student in an Organized Health Care Education/Training Program

## 2023-05-26 ENCOUNTER — Encounter: Payer: Self-pay | Admitting: Student in an Organized Health Care Education/Training Program

## 2023-05-26 ENCOUNTER — Ambulatory Visit
Payer: Medicare Other | Attending: Student in an Organized Health Care Education/Training Program | Admitting: Student in an Organized Health Care Education/Training Program

## 2023-05-26 ENCOUNTER — Ambulatory Visit
Admission: RE | Admit: 2023-05-26 | Discharge: 2023-05-26 | Disposition: A | Discharge status: Home / Self Care | Payer: Medicare Other | Source: Ambulatory Visit | Attending: Student in an Organized Health Care Education/Training Program | Admitting: Student in an Organized Health Care Education/Training Program

## 2023-05-26 VITALS — BP 114/75 | HR 79 | Temp 97.9°F | Resp 14 | Ht 65.0 in | Wt 139.0 lb

## 2023-05-26 DIAGNOSIS — M47816 Spondylosis without myelopathy or radiculopathy, lumbar region: Secondary | ICD-10-CM | POA: Diagnosis present

## 2023-05-26 MED ORDER — MIDAZOLAM HCL 2 MG/2ML IJ SOLN
0.5000 mg | Freq: Once | INTRAMUSCULAR | Status: AC
  Administered 2023-05-26: 1.5 mg via INTRAVENOUS
  Filled 2023-05-26: qty 2

## 2023-05-26 MED ORDER — ROPIVACAINE HCL 2 MG/ML IJ SOLN
18.0000 mL | Freq: Once | INTRAMUSCULAR | Status: AC
  Administered 2023-05-26: 18 mL via PERINEURAL
  Filled 2023-05-26: qty 20

## 2023-05-26 MED ORDER — DEXAMETHASONE SODIUM PHOSPHATE 10 MG/ML IJ SOLN
20.0000 mg | Freq: Once | INTRAMUSCULAR | Status: AC
Start: 2023-05-26 — End: 2023-05-26
  Administered 2023-05-26: 20 mg
  Filled 2023-05-26: qty 2

## 2023-05-26 MED ORDER — LACTATED RINGERS IV SOLN
Freq: Once | INTRAVENOUS | Status: AC
Start: 2023-05-26 — End: 2023-05-26

## 2023-05-26 MED ORDER — LIDOCAINE HCL 2 % IJ SOLN
20.0000 mL | Freq: Once | INTRAMUSCULAR | Status: AC
  Administered 2023-05-26: 200 mg
  Filled 2023-05-26: qty 20

## 2023-05-26 NOTE — Progress Notes (Signed)
PROVIDER NOTE: Interpretation of information contained herein should be left to medically-trained personnel. Specific patient instructions are provided elsewhere under "Patient Instructions" section of medical record. This document was created in part using STT-dictation technology, any transcriptional errors that may result from this process are unintentional.  Patient: Jessica Haas Type: Established DOB: 1950/11/26 MRN: [id number] PCP: No primary care provider on file.  Service: Procedure DOS: 05/26/2023 Setting: Ambulatory Location: Ambulatory outpatient facility Delivery: Face-to-face Provider: Edward Jolly, MD Specialty: Interventional Pain Management Specialty designation: 09 Location: Outpatient facility Ref. Prov.: Mariam Dollar, MD       Interventional Therapy   Type: Lumbar Facet, Medial Branch Block(s) (w/ fluoroscopic mapping) #1  Laterality: Bilateral  Level: L3, L4, and L5 Medial Branch Level(s). Injecting these levels blocks the L3-4 and L4-5 lumbar facet joints. Imaging: Fluoroscopic guidance         Anesthesia: Local anesthesia (1-2% Lidocaine) YesSedation: Minimal Sedation                       DOS: 05/26/2023 Performed by: Edward Jolly, MD  Primary Purpose: Diagnostic/Therapeutic Indications: Low back pain severe enough to impact quality of life or function. 1. Lumbar facet arthropathy   2. Lumbar spondylosis    NAS-11 Pain score:   Pre-procedure: 5 /10   Post-procedure: 2 /10     Position / Prep / Materials:  Position: Prone  Prep solution: ChloraPrep (2% chlorhexidine gluconate and 70% isopropyl alcohol) Area Prepped: Posterolateral Lumbosacral Spine (Wide prep: From the lower border of the scapula down to the end of the tailbone and from flank to flank.)  Materials:  Tray: Block Needle(s):  Type: Spinal  Gauge (G): 22  Length: 3.5-in Qty: 2      H&P (Pre-op Assessment):  Jessica Haas is a 73 y.o. (year old), female patient, seen today for  interventional treatment. She  has a past surgical history that includes Abdominal hysterectomy; Carpal tunnel release; Foot Fusion (Left); Cataract extraction w/PHACO (Left, 12/19/2015); Cataract extraction w/PHACO (Right, 02/13/2016); Cervical fusion; and Bone excision (Right, 06/04/2018). Jessica Haas has a current medication list which includes the following prescription(s): acetaminophen, alprazolam, celecoxib, fluoxetine, gabapentin, hydrochlorothiazide, ivermectin, pitavastatin calcium, tizanidine, trazodone, zolpidem, altreno, clobetasol ointment, estradiol, and mupirocin ointment, and the following Facility-Administered Medications: lactated ringers. Her primarily concern today is the Back Pain (lower)  Initial Vital Signs:  Pulse/HCG Rate: 79ECG Heart Rate: 80 Temp: 97.9 F (36.6 C) Resp: 14 BP: 110/80 SpO2: 100 %  BMI: Estimated body mass index is 23.13 kg/m as calculated from the following:   Height as of this encounter: 5\' 5"  (1.651 m).   Weight as of this encounter: 139 lb (63 kg).  Risk Assessment: Allergies: Reviewed. She is allergic to doxycycline, erythromycin, lidocaine, losartan, rosuvastatin, tetracyclines & related, and thimerosal (thiomersal).  Allergy Precautions: None required Coagulopathies: Reviewed. None identified.  Blood-thinner therapy: None at this time Active Infection(s): Reviewed. None identified. Jessica Haas is afebrile  Site Confirmation: Jessica Haas was asked to confirm the procedure and laterality before marking the site Procedure checklist: Completed Consent: Before the procedure and under the influence of no sedative(s), amnesic(s), or anxiolytics, the patient was informed of the treatment options, risks and possible complications. To fulfill our ethical and legal obligations, as recommended by the American Medical Association's Code of Ethics, I have informed the patient of my clinical impression; the nature and purpose of the treatment or procedure;  the risks, benefits, and possible complications of the intervention; the alternatives,  including doing nothing; the risk(s) and benefit(s) of the alternative treatment(s) or procedure(s); and the risk(s) and benefit(s) of doing nothing. The patient was provided information about the general risks and possible complications associated with the procedure. These may include, but are not limited to: failure to achieve desired goals, infection, bleeding, organ or nerve damage, allergic reactions, paralysis, and death. In addition, the patient was informed of those risks and complications associated to Spine-related procedures, such as failure to decrease pain; infection (i.e.: Meningitis, epidural or intraspinal abscess); bleeding (i.e.: epidural hematoma, subarachnoid hemorrhage, or any other type of intraspinal or peri-dural bleeding); organ or nerve damage (i.e.: Any type of peripheral nerve, nerve root, or spinal cord injury) with subsequent damage to sensory, motor, and/or autonomic systems, resulting in permanent pain, numbness, and/or weakness of one or several areas of the body; allergic reactions; (i.e.: anaphylactic reaction); and/or death. Furthermore, the patient was informed of those risks and complications associated with the medications. These include, but are not limited to: allergic reactions (i.e.: anaphylactic or anaphylactoid reaction(s)); adrenal axis suppression; blood sugar elevation that in diabetics may result in ketoacidosis or comma; water retention that in patients with history of congestive heart failure may result in shortness of breath, pulmonary edema, and decompensation with resultant heart failure; weight gain; swelling or edema; medication-induced neural toxicity; particulate matter embolism and blood vessel occlusion with resultant organ, and/or nervous system infarction; and/or aseptic necrosis of one or more joints. Finally, the patient was informed that Medicine is not an exact  science; therefore, there is also the possibility of unforeseen or unpredictable risks and/or possible complications that may result in a catastrophic outcome. The patient indicated having understood very clearly. We have given the patient no guarantees and we have made no promises. Enough time was given to the patient to ask questions, all of which were answered to the patient's satisfaction. Ms. Masin has indicated that she wanted to continue with the procedure. Attestation: I, the ordering provider, attest that I have discussed with the patient the benefits, risks, side-effects, alternatives, likelihood of achieving goals, and potential problems during recovery for the procedure that I have provided informed consent. Date  Time: 05/26/2023 12:36 PM   Pre-Procedure Preparation:  Monitoring: As per clinic protocol. Respiration, ETCO2, SpO2, BP, heart rate and rhythm monitor placed and checked for adequate function Safety Precautions: Patient was assessed for positional comfort and pressure points before starting the procedure. Time-out: I initiated and conducted the "Time-out" before starting the procedure, as per protocol. The patient was asked to participate by confirming the accuracy of the "Time Out" information. Verification of the correct person, site, and procedure were performed and confirmed by me, the nursing staff, and the patient. "Time-out" conducted as per Joint Commission's Universal Protocol (UP.01.01.01). Time: 1326 Start Time: 1326 hrs.  Description of Procedure:          Laterality: (see above) Targeted Levels: (see above)  Safety Precautions: Aspiration looking for blood return was conducted prior to all injections. At no point did we inject any substances, as a needle was being advanced. Before injecting, the patient was told to immediately notify me if she was experiencing any new onset of "ringing in the ears, or metallic taste in the mouth". No attempts were made at seeking  any paresthesias. Safe injection practices and needle disposal techniques used. Medications properly checked for expiration dates. SDV (single dose vial) medications used. After the completion of the procedure, all disposable equipment used was discarded in the proper  designated Metallurgist. Local Anesthesia: Protocol guidelines were followed. The patient was positioned over the fluoroscopy table. The area was prepped in the usual manner. The time-out was completed. The target area was identified using fluoroscopy. A 12-in long, straight, sterile hemostat was used with fluoroscopic guidance to locate the targets for each level blocked. Once located, the skin was marked with an approved surgical skin marker. Once all sites were marked, the skin (epidermis, dermis, and hypodermis), as well as deeper tissues (fat, connective tissue and muscle) were infiltrated with a small amount of a short-acting local anesthetic, loaded on a 10cc syringe with a 25G, 1.5-in  Needle. An appropriate amount of time was allowed for local anesthetics to take effect before proceeding to the next step. Local Anesthetic: Lidocaine 2.0% The unused portion of the local anesthetic was discarded in the proper designated containers. Technical description of process:   L3 Medial Branch Nerve Block (MBB): The target area for the L3 medial branch is at the junction of the postero-lateral aspect of the superior articular process and the superior, posterior, and medial edge of the transverse process of L4. Under fluoroscopic guidance, a Quincke needle was inserted until contact was made with os over the superior postero-lateral aspect of the pedicular shadow (target area). After negative aspiration for blood, 2mL of the nerve block solution was injected without difficulty or complication. The needle was removed intact. L4 Medial Branch Nerve Block (MBB): The target area for the L4 medial branch is at the junction of the  postero-lateral aspect of the superior articular process and the superior, posterior, and medial edge of the transverse process of L5. Under fluoroscopic guidance, a Quincke needle was inserted until contact was made with os over the superior postero-lateral aspect of the pedicular shadow (target area). After negative aspiration for blood, 2 mL of the nerve block solution was injected without difficulty or complication. The needle was removed intact. L5 Medial Branch Nerve Block (MBB): The target area for the L5 medial branch is at the junction of the postero-lateral aspect of the superior articular process and the superior, posterior, and medial edge of the sacral ala. Under fluoroscopic guidance, a Quincke needle was inserted until contact was made with os over the superior postero-lateral aspect of the pedicular shadow (target area). After negative aspiration for blood, 2mL of the nerve block solution was injected without difficulty or complication. The needle was removed intact.  Once the entire procedure was completed, the treated area was cleaned, making sure to leave some of the prepping solution back to take advantage of its long term bactericidal properties.         Illustration of the posterior view of the lumbar spine and the posterior neural structures. Laminae of L2 through S1 are labeled. DPRL5, dorsal primary ramus of L5; DPRS1, dorsal primary ramus of S1; DPR3, dorsal primary ramus of L3; FJ, facet (zygapophyseal) joint L3-L4; I, inferior articular process of L4; LB1, lateral branch of dorsal primary ramus of L1; IAB, inferior articular branches from L3 medial branch (supplies L4-L5 facet joint); IBP, intermediate branch plexus; MB3, medial branch of dorsal primary ramus of L3; NR3, third lumbar nerve root; S, superior articular process of L5; SAB, superior articular branches from L4 (supplies L4-5 facet joint also); TP3, transverse process of L3.   Facet Joint Innervation (* possible  contribution)  L1-2 T12, L1 (L2*)  Medial Branch  L2-3 L1, L2 (L3*)         "          "  L3-4 L2, L3 (L4*)         "          "  L4-5 L3, L4 (L5*)         "          "  L5-S1 L4, L5, S1          "          "    Vitals:   05/26/23 1329 05/26/23 1334 05/26/23 1335 05/26/23 1341  BP: 108/77 116/76 105/73 114/75  Pulse:      Resp: 15 18 14 14   Temp:      TempSrc:      SpO2: 94% 95% 97% 95%  Weight:      Height:         End Time: 1335 hrs.  Imaging Guidance (Spinal):          Type of Imaging Technique: Fluoroscopy Guidance (Spinal) Indication(s): Fluoroscopy guidance for needle placement to enhance accuracy in procedures requiring precise needle localization for targeted delivery of medication in or near specific anatomical locations not easily accessible without such real-time imaging assistance. Exposure Time: Please see nurses notes. Contrast: None used. Fluoroscopic Guidance: I was personally present during the use of fluoroscopy. "Tunnel Vision Technique" used to obtain the best possible view of the target area. Parallax error corrected before commencing the procedure. "Direction-depth-direction" technique used to introduce the needle under continuous pulsed fluoroscopy. Once target was reached, antero-posterior, oblique, and lateral fluoroscopic projection used confirm needle placement in all planes. Images permanently stored in EMR. Interpretation: No contrast injected. I personally interpreted the imaging intraoperatively. Adequate needle placement confirmed in multiple planes. Permanent images saved into the patient's record.  Post-operative Assessment:  Post-procedure Vital Signs:  Pulse/HCG Rate: 7976 Temp: 97.9 F (36.6 C) Resp: 14 BP: 114/75 SpO2: 95 %  EBL: None  Complications: No immediate post-treatment complications observed by team, or reported by patient.  Note: The patient tolerated the entire procedure well. A repeat set of vitals were taken after the  procedure and the patient was kept under observation following institutional policy, for this type of procedure. Post-procedural neurological assessment was performed, showing return to baseline, prior to discharge. The patient was provided with post-procedure discharge instructions, including a section on how to identify potential problems. Should any problems arise concerning this procedure, the patient was given instructions to immediately contact us, at any time, without hesitation. In any case, we plan to contact the patient by telephone for a follow-up status report regarding this interventional procedure.  Comments:  No additional relevant information.  Plan of Care (POC)  Orders:  Orders Placed This Encounter  Procedures   DG PAIN [facility] C-ARM 1-60 MIN NO REPORT    Intraoperative interpretation by procedural physician at [facility].    Standing Status:   Standing    Number of Occurrences:   1    Reason for exam::   Assistance in needle guidance and placement for procedures requiring needle placement in or near specific anatomical locations not easily accessible without such assistance.     Medications ordered for procedure: Meds ordered this encounter  Medications   lidocaine (XYLOCAINE) 2 % (with pres) injection 400 mg   lactated ringers infusion   midazolam (VERSED) injection 0.5-2 mg    Make sure Flumazenil is available in the pyxis when using this medication. If oversedation occurs, administer 0.2 mg IV over 15 sec. If after 45 sec no response, administer 0.2 mg  again over 1 min; may repeat at 1 min intervals; not to exceed 4 doses (1 mg)   ropivacaine (PF) 2 mg/mL (0.2%) (NAROPIN) injection 18 mL   dexamethasone (DECADRON) injection 20 mg   Medications administered: We administered lidocaine, lactated ringers, midazolam, ropivacaine (PF) 2 mg/mL (0.2%), and dexamethasone.  See the medical record for exact dosing, route, and time of administration.  Follow-up  plan:   Return in about 2 weeks (around 06/09/2023) for PPE, F2F.       BLF L3,4,5 05/26/23    Recent Visits Date Type Provider Dept  05/06/23 Office Visit Edward Jolly, MD [facility]  Showing recent visits within past 90 days and meeting all other requirements Today's Visits Date Type Provider Dept  05/26/23 Procedure visit Edward Jolly, MD [facility]  Showing today's visits and meeting all other requirements Future Appointments Date Type Provider Dept  06/11/23 Appointment Edward Jolly, MD [facility]  Showing future appointments within next 90 days and meeting all other requirements  Disposition: Discharge home  Discharge (Date  Time): 05/26/2023; 1350 hrs.   Primary Care Physician: No primary care provider on file. Location: [facility] Note by: Edward Jolly, MD (TTS technology used. I apologize for any typographical errors that were not detected and corrected.) Date: 05/26/2023; Time: 1:44 PM  Disclaimer:  Medicine is not an Visual merchandiser. The only guarantee in medicine is that nothing is guaranteed. It is important to note that the decision to proceed with this intervention was based on the information collected from the patient. The Data and conclusions were drawn from the patient's questionnaire, the interview, and the physical examination. Because the information was provided in large part by the patient, it cannot be guaranteed that it has not been purposely or unconsciously manipulated. Every effort has been made to obtain as much relevant data as possible for this evaluation. It is important to note that the conclusions that lead to this procedure are derived in large part from the available data. Always take into account that the treatment will also be dependent on availability of resources and existing treatment guidelines, considered by other Pain Management Practitioners as being common knowledge and practice, at  the time of the intervention. For Medico-Legal purposes, it is also important to point out that variation in procedural techniques and pharmacological choices are the acceptable norm. The indications, contraindications, technique, and results of the above procedure should only be interpreted and judged by a Board-Certified Interventional Pain Specialist with extensive familiarity and expertise in the same exact procedure and technique.

## 2023-05-26 NOTE — Patient Instructions (Signed)

## 2023-05-27 ENCOUNTER — Telehealth: Payer: Self-pay

## 2023-05-27 NOTE — Telephone Encounter (Signed)
Post procedure follow up.  Phone kept going dead after first ring.  Unable to reach patient.

## 2023-05-29 ENCOUNTER — Telehealth: Payer: Self-pay

## 2023-05-29 NOTE — Telephone Encounter (Signed)
 Discussed with patient upcoming appointment for fillers to the lips and prophylactic valtrex .  Reminded patient to start Valtrex  500mg  1 po bid for 7 days starting 1 day prior to appointment.  Patient advised she had Valtrex  at home and did not need refill sent in./sh

## 2023-06-02 ENCOUNTER — Ambulatory Visit (INDEPENDENT_AMBULATORY_CARE_PROVIDER_SITE_OTHER): Payer: Self-pay | Admitting: Dermatology

## 2023-06-02 DIAGNOSIS — L988 Other specified disorders of the skin and subcutaneous tissue: Secondary | ICD-10-CM

## 2023-06-02 NOTE — Patient Instructions (Signed)
 Recommend  Normand Sloop, M.D. [address] [place], [place] [address] [phone]  [email or web address]    Due to recent changes in healthcare laws, you may see results of your pathology and/or laboratory studies on MyChart before the doctors have had a chance to review them. We understand that in some cases there may be results that are confusing or concerning to you. Please understand that not all results are received at the same time and often the doctors may need to interpret multiple results in order to provide you with the best plan of care or course of treatment. Therefore, we ask that you please give [place] 2 business days to thoroughly review all your results before contacting the office for clarification. Should we see a critical lab result, you will be contacted sooner.   If You Need Anything After Your Visit  If you have any questions or concerns for your doctor, please call our main line at [phone] and press option 4 to reach your doctor's medical assistant. If no one answers, please leave a voicemail as directed and we will return your call as soon as possible. Messages left after 4 pm will be answered the following business day.   You may also send [place] a message via MyChart. We typically respond to MyChart messages within 1-2 business days.  For prescription refills, please ask your pharmacy to contact our office. Our fax number is [phone].  If you have an urgent issue when the clinic is closed that cannot wait until the next business day, you can page your doctor at the number below.    Please note that while we do our best to be available for urgent issues outside of office hours, we are not available 24/7.   If you have an urgent issue and are unable to reach [place], you may choose to seek medical care at your doctor's office, retail clinic, urgent care center, or emergency room.  If you have a medical emergency, please immediately call 911 or go to  the emergency department.  Pager Numbers  - Dr. Gwen Pounds: [phone]  - Dr. Roseanne Reno: [phone]  - Dr. Katrinka Blazing: [phone]   In the event of inclement weather, please call our main line at [phone] for an update on the status of any delays or closures.  Dermatology Medication Tips: Please keep the boxes that topical medications come in in order to help keep track of the instructions about where and how to use these. Pharmacies typically print the medication instructions only on the boxes and not directly on the medication tubes.   If your medication is too expensive, please contact our office at [phone] option 4 or send [place] a message through MyChart.   We are unable to tell what your co-pay for medications will be in advance as this is different depending on your insurance coverage. However, we may be able to find a substitute medication at lower cost or fill out paperwork to get insurance to cover a needed medication.   If a prior authorization is required to get your medication covered by your insurance company, please allow [place] 1-2 business days to complete this process.  Drug prices often vary depending on where the prescription is filled and some pharmacies may offer cheaper prices.  The website www.goodrx.com contains coupons for medications through different pharmacies. The prices here do not account for what the cost may be with help from insurance (it may be cheaper with your insurance), but the website can give  you the price if you did not use any insurance.  - You can print the associated coupon and take it with your prescription to the pharmacy.  - You may also stop by our office during regular business hours and pick up a GoodRx coupon card.  - If you need your prescription sent electronically to a different pharmacy, notify our office through [facility] or by phone at [phone] option 4.     Si Usted Necesita Algo Despus de Su Visita  Tambin  puede enviarnos un mensaje a travs de Clinical cytogeneticist. Por lo general respondemos a los mensajes de MyChart en el transcurso de 1 a 2 das hbiles.  Para renovar recetas, por favor pida a su farmacia que se ponga en contacto con nuestra oficina. Annie Sable de fax es [place] [phone].  Si tiene un asunto urgente cuando la clnica est cerrada y que no puede esperar hasta el siguiente da hbil, puede llamar/localizar a su doctor(a) al nmero que aparece a continuacin.   Por favor, tenga en cuenta que aunque hacemos todo lo posible para estar disponibles para asuntos urgentes fuera del horario de [place], no estamos disponibles las 24 horas del da, los 7 [address]  [address] de la [place].   Si tiene un problema urgente y no puede comunicarse con nosotros, puede optar por buscar atencin mdica  en el consultorio de su doctor(a), en una clnica privada, en un centro de atencin urgente o en una sala de emergencias.  Si tiene Engineer, drilling, por favor llame inmediatamente al 911 o vaya a la sala de emergencias.  Nmeros de bper  - Dr. Gwen Pounds: [phone]  - Dra. Roseanne Reno: [phone]  - Dr. Katrinka Blazing: [phone]   En caso de inclemencias del tiempo, por favor llame a Lacy Duverney principal al [phone] para una actualizacin sobre el [place] de cualquier retraso o cierre.  Consejos para la medicacin en dermatologa: Por favor, guarde las cajas en las que vienen los medicamentos de uso tpico para ayudarle a seguir las instrucciones sobre dnde y cmo usarlos. Las farmacias generalmente imprimen las instrucciones del medicamento slo en las cajas y no directamente en los tubos del [place].   Si su medicamento es muy caro, por favor, pngase en contacto con Rolm Gala llamando al [phone] y presione la opcin 4 o envenos un mensaje a travs de Clinical cytogeneticist.   No podemos decirle cul ser su copago por los medicamentos por adelantado ya que esto es diferente dependiendo de la cobertura de su  seguro. Sin embargo, es posible que podamos encontrar un medicamento sustituto a Audiological scientist un formulario para que el seguro cubra el medicamento que se considera necesario.   Si se requiere una autorizacin previa para que su compaa de seguros [place] su medicamento, por favor permtanos de 1 a 2 das hbiles para completar [address]  Los precios de los medicamentos varan con frecuencia dependiendo del Environmental consultant de dnde se surte la receta y alguna farmacias pueden ofrecer precios ms baratos.  El sitio web www.goodrx.com tiene cupones para medicamentos de Health and safety inspector. Los precios aqu no tienen en cuenta lo que podra costar con la ayuda del seguro (puede ser ms barato con su seguro), pero el sitio web puede darle el precio si no utiliz Tourist information centre manager.  - Puede imprimir el cupn correspondiente y llevarlo con su receta a la farmacia.  - Tambin puede pasar por nuestra oficina durante el horario de atencin regular y Education officer, museum una tarjeta de cupones de GoodRx.  -  Si necesita que su receta se enve electrnicamente a Psychiatrist, informe a nuestra oficina a travs de MyChart de [address] o por telfono llamando al [phone] y presione la opcin 4.

## 2023-06-02 NOTE — Progress Notes (Signed)
   Follow-Up Visit   Subjective  Jessica Haas is a 73 y.o. female who presents for the following: filler for facial elastosis  The following portions of the chart were reviewed this encounter and updated as appropriate: medications, allergies, medical history  Review of Systems:  No other skin or systemic complaints except as noted in HPI or Assessment and Plan.  Objective  Well appearing patient in no apparent distress; mood and affect are within normal limits.  A focused examination was performed of the face. Relevant physical exam findings are noted in the Assessment and Plan or shown in photos.  Before photos         Injection map photo     Assessment & Plan    Facial Elastosis  Prior to the procedure, the patient's past medical history, allergies and the rare but potential risks and complications were reviewed with the patient and a signed consent was obtained. Pre and post-treatment care was discussed and instructions provided.   Location: cheeks  Filler Type: Juvederm Voluma  Procedure:  The area was prepped thoroughly with Puracyn. After introducing the needle into the desired treatment area, the syringe plunger was drawn back to ensure there was no flash of blood prior to injecting the filler in order to minimize risk of intravascular injection and vascular occlusion. (L cheek - 0.4 cc, R cheek - 0.6 cc) After injection of the filler, the treated areas were cleansed and iced to reduce swelling. Post-treatment instructions were reviewed with the patient.       Location: perioral, lips  Filler Type: Restylane Refyne  Procedure: Lidocaine -tetracaine  ointment was applied to treatment areas to achieve good local anesthesia. The area was prepped thoroughly with Puracyn. After introducing the needle into the desired treatment area, the syringe plunger was drawn back to ensure there was no flash of blood prior to injecting the filler in order to minimize risk of  intravascular injection and vascular occlusion. After injection of the filler, the treated areas were cleansed and iced to reduce swelling. Post-treatment instructions were reviewed with the patient.    Patient tolerated the procedure well. The patient will call with any problems, questions or concerns prior to their next appointment.    Return as scheduled.  IBernardine Bridegroom, CMA, am acting as scribe for Artemio Larry, MD .   Documentation: I have reviewed the above documentation for accuracy and completeness, and I agree with the above.  Artemio Larry, MD

## 2023-06-10 ENCOUNTER — Ambulatory Visit: Payer: Medicare Other

## 2023-06-11 ENCOUNTER — Encounter: Payer: Self-pay | Admitting: Student in an Organized Health Care Education/Training Program

## 2023-06-11 ENCOUNTER — Telehealth: Payer: Self-pay

## 2023-06-11 ENCOUNTER — Ambulatory Visit
Payer: Medicare Other | Attending: Student in an Organized Health Care Education/Training Program | Admitting: Student in an Organized Health Care Education/Training Program

## 2023-06-11 DIAGNOSIS — M5136 Other intervertebral disc degeneration, lumbar region with discogenic back pain only: Secondary | ICD-10-CM

## 2023-06-11 DIAGNOSIS — M47816 Spondylosis without myelopathy or radiculopathy, lumbar region: Secondary | ICD-10-CM

## 2023-06-11 NOTE — Progress Notes (Signed)
Patient: Jessica Haas  Service Category: E/M  Provider: Edward Jolly, MD  DOB: Oct 22, 1950  DOS: 06/11/2023  Location: Office  MRN: [id number]  Setting: Ambulatory outpatient  Referring Provider: No ref. provider found  Type: Established Patient  Specialty: Interventional Pain Management  PCP: No primary care provider on file.  Location: Remote location  Delivery: TeleHealth     Virtual Encounter - Pain Management PROVIDER NOTE: Information contained herein reflects review and annotations entered in association with encounter. Interpretation of such information and data should be left to medically-trained personnel. Information provided to patient can be located elsewhere in the medical record under "Patient Instructions". Document created using STT-dictation technology, any transcriptional errors that may result from process are unintentional.    Contact & Pharmacy Preferred: [phone] Home: [phone] (home) Mobile: [phone] (mobile) E-mail: [email or web address]  [facility] - [place], [place] - [address] [place] [address] Phone: [phone] Fax: [phone]  [facility] - [place], [place] - [address] [place] [address] Phone: [phone] Fax: [phone]  [id number] - [place], [place] - [address] [place] [address] Phone: [phone] Fax: [phone]   Pre-screening  Jessica Haas offered "in-person" vs "virtual" encounter. She indicated preferring virtual for this encounter.   Reason COVID-19*  Social distancing based on CDC and AMA recommendations.   I contacted Jessica Haas on 06/11/2023 via telephone.      I clearly identified myself as Edward Jolly, MD. I verified that I was speaking with the correct person using two identifiers (Name: Jessica Haas, and date of birth: 04-23-1950).  Consent I sought verbal advanced consent from Jessica Haas for virtual visit interactions. I informed Jessica Haas of possible security and privacy concerns, risks, and limitations associated with providing "not-in-person" medical evaluation and management services. I also informed Jessica Haas of the availability of "in-person" appointments. Finally, I informed her that there would be a charge for the virtual visit and that she could be  personally, fully or partially, financially responsible for it. Jessica Haas expressed understanding and agreed to proceed.   Historic Elements   Jessica Haas is a 73 y.o. year old, female patient evaluated today after our last contact on 05/26/2023. Jessica Haas  has a past medical history of Actinic keratosis (11/29/2014), Anemia (2008), Anxiety, Arthritis, Basal cell carcinoma of hand, Depression, GERD (gastroesophageal reflux disease), H/O abnormal mammogram (2008), H/O gastric ulcer (2008), High altitude sickness (2008), History of hiatal hernia, Neuromuscular disorder (HCC), Skin cancer (03/19/2016), Skin cancer (09/15/2018), Squamous cell carcinoma of skin (03/19/2016), Squamous cell carcinoma of skin (09/15/2018), Squamous cell carcinoma of skin (04/03/2021), Swelling, and Varicose vein of leg. She also  has a past surgical history that includes Abdominal hysterectomy; Carpal tunnel release; Foot Fusion (Left); Cataract extraction w/PHACO (Left, 12/19/2015); Cataract extraction w/PHACO (Right, 02/13/2016); Cervical fusion; and Bone excision (Right, 06/04/2018). Jessica Haas has a current medication list which includes the following prescription(s): acetaminophen, alprazolam, altreno, celecoxib, clobetasol ointment, fluoxetine, gabapentin, hydrochlorothiazide, ivermectin, mupirocin ointment, pitavastatin calcium, tizanidine, trazodone, zolpidem, and estradiol. She  reports that she quit smoking about 45 years ago. Her smoking use included cigarettes. She has quit using smokeless tobacco. She reports that she does not  drink alcohol and does not use drugs. Jessica Haas is allergic to doxycycline, erythromycin, lidocaine, losartan, rosuvastatin, tetracyclines & related, and thimerosal (thiomersal).  BMI: Estimated body mass index is  23.13 kg/m as calculated from the following:   Height as of 05/26/23: 5\' 5"  (1.651 m).   Weight as of 05/26/23: 139 lb (63 kg). Last encounter: 05/06/2023. Last procedure: 05/26/2023.  HPI  Today, she is being contacted for a post-procedure assessment.   Post-procedure evaluation    Type: Lumbar Facet, Medial Branch Block(s) (w/ fluoroscopic mapping) #1  Laterality: Bilateral  Level: L3, L4, and L5 Medial Branch Level(s). Injecting these levels blocks the L3-4 and L4-5 lumbar facet joints. Imaging: Fluoroscopic guidance         Anesthesia: Local anesthesia (1-2% Lidocaine) YesSedation: Minimal Sedation                       DOS: 05/26/2023 Performed by: Edward Jolly, MD  Primary Purpose: Diagnostic/Therapeutic Indications: Low back pain severe enough to impact quality of life or function. 1. Lumbar facet arthropathy   2. Lumbar spondylosis    NAS-11 Pain score:   Pre-procedure: 5 /10   Post-procedure: 2 /10     Effectiveness:  Initial hour after procedure: 100 %  Subsequent 4-6 hours post-procedure: 100 %  Analgesia past initial 6 hours: 80% Ongoing improvement:  Analgesic:  80% for 5 days then decreased to 50% Function: Somewhat improved ROM: Somewhat improved   Laboratory Chemistry Profile   Renal Lab Results  Component Value Date   BUN 19 04/27/2012   CREATININE 0.9 04/30/2018   GFR 79.26 04/27/2012    Hepatic Lab Results  Component Value Date   AST 29 04/30/2018   ALT 16 04/30/2018   ALBUMIN 4.1 04/27/2012   ALKPHOS 46 04/30/2018    Electrolytes Lab Results  Component Value Date   NA 138 04/30/2018   K 4.1 04/30/2018   CL 105 04/27/2012   CALCIUM 9.1 04/27/2012    Bone Lab Results  Component Value Date   VD25OH 29 04/30/2018     Inflammation (CRP: Acute Phase) (ESR: Chronic Phase) No results found for: "CRP", "ESRSEDRATE", "LATICACIDVEN"       Note: Above Lab results reviewed.   Assessment  The primary encounter diagnosis was Lumbar facet arthropathy. Diagnoses of Lumbar spondylosis and Degeneration of intervertebral disc of lumbar region with discogenic back pain were also pertinent to this visit.  Plan of Care  Positive response to first set of diagnostic lumbar facet medial branch nerve blocks done bilaterally at L3, L4, L5 as detailed above.  We discussed repeating diagnostic nerve block #2 for confirmation and then considering lumbar radiofrequency ablation for the purpose of obtaining longer-term pain relief.  Risk and benefits reviewed and patient would like to proceed with her second set of diagnostic lumbar facet medial branch nerve blocks and is looking forward to her lumbar RFA thereafter   Orders:  Orders Placed This Encounter  Procedures   LUMBAR FACET(MEDIAL BRANCH NERVE BLOCK) MBNB    Standing Status:   Future    Expiration Date:   09/08/2023    Scheduling Instructions:     Procedure: Lumbar facet block (AKA.: Lumbosacral medial branch nerve block)     Side: Bilateral     Level: L3-4, L4-5, Facets ( L3, L4, L5, aMedial Branch)     Sedation: IV Versed     Timeframe: ASAA    Where will this procedure be performed?:   [facility]   Follow-up plan:   Return in about 26 days (around 07/07/2023) for B/L L3, 4, 5 MBNB #2, in clinic IV Versed.      BLF  L3,4,5 05/26/23    Recent Visits Date Type Provider Dept  05/26/23 Procedure visit Edward Jolly, MD [facility]  05/06/23 Office Visit Edward Jolly, MD [facility]  Showing recent visits within past 90 days and meeting all other requirements Today's Visits Date Type Provider Dept  06/11/23 Office Visit Edward Jolly, MD [facility]  Showing today's visits and meeting all other requirements Future  Appointments No visits were found meeting these conditions. Showing future appointments within next 90 days and meeting all other requirements  I discussed the assessment and treatment plan with the patient. The patient was provided an opportunity to ask questions and all were answered. The patient agreed with the plan and demonstrated an understanding of the instructions.  Patient advised to call back or seek an in-person evaluation if the symptoms or condition worsens.  Duration of encounter: .  Note by: Edward Jolly, MD Date: 06/11/2023; Time: 12:39 PM

## 2023-06-11 NOTE — Telephone Encounter (Signed)
Patient has been called for PPE.

## 2023-06-11 NOTE — Patient Instructions (Signed)

## 2023-06-30 ENCOUNTER — Encounter: Payer: Self-pay | Admitting: Dermatology

## 2023-06-30 ENCOUNTER — Ambulatory Visit (INDEPENDENT_AMBULATORY_CARE_PROVIDER_SITE_OTHER): Payer: Self-pay | Admitting: Dermatology

## 2023-06-30 DIAGNOSIS — L814 Other melanin hyperpigmentation: Secondary | ICD-10-CM

## 2023-06-30 DIAGNOSIS — I781 Nevus, non-neoplastic: Secondary | ICD-10-CM

## 2023-06-30 DIAGNOSIS — L719 Rosacea, unspecified: Secondary | ICD-10-CM

## 2023-06-30 NOTE — Progress Notes (Signed)
 Follow-Up Visit   Subjective  Jessica Haas is a 73 y.o. female who presents for the following: Lentigines face, neck, chest, rosacea face   The following portions of the chart were reviewed this encounter and updated as appropriate: medications, allergies, medical history  Review of Systems:  No other skin or systemic complaints except as noted in HPI or Assessment and Plan.  Objective  Well appearing patient in no apparent distress; mood and affect are within normal limits.  A focused examination was performed of the following areas: Face, neck, chest  Relevant exam findings are noted in the Assessment and Plan.                                Assessment & Plan   LENTIGINES Face, neck, chest Exam: brown macules face, neck, chest  Treatment Plan: BBL today   [place] BBL - 06/30/23 1500      Patient Details   Current Skin Care: face, neck, chest    Skin Type: I    Anesthestic Cream Applied: No    Photo Takes: Yes    Consent Signed: Yes    Improvement from Previous Treatment: Yes      Treatment Details   Date: 06/30/23    Area: face, neck, chest    Filter: 1st Pass, 2nd Pass;3rd Pass;4th Pass      1st Pass   Location: F   pigmented lesions   Device: 515 filter    BBL j/cm2: 12    PW Msec Sec: 10    Cooling Temp: 25    Pulses: 230    15x45: This crystal used 157 pulses    15x15: This crystal used 73 pulses        3rd Pass   Location: Other   neck and chest, vascular and pigmented   Device: 560 filter    BBL j/cm2: 9    PW Msec Sec: 10    Cooling Temp: 15    Pulses: 193    15x45: This crystal used      4th Pass   Location: Other   neck, chest pigmented spot treat   Device: 515 filter    BBL j/cm2: 10,12    PW Msec Sec: 10    Cooling Temp: 20    Pulses: 73 (P)     15x15: this crystal used      Patient tolerated the procedure well.   Wynelle Link avoidance was stressed. The patient will call with any problems, questions or  concerns prior to their next appointment.  Laser safety: Patient was advised in laser safety.  Patient was fitted with laser safety goggles and advised to keep eyes closed during procedure with goggles on. Staff and provider ensured that patient and their own safety goggles were also on and eyes protected during procedure. Laser room door was secured and locked from the inside. Laser room door has laser safety sign affixed to the outside of the door.   ROSACEA face Exam Mid face, neck and chest with erythema and telangiectasias   Chronic and persistent condition with duration or expected duration over one year. Condition is symptomatic / bothersome to patient. Not to goal.   Rosacea is a chronic progressive skin condition usually affecting the face of adults, causing redness and/or acne bumps. It is treatable but not curable. It sometimes affects the eyes (ocular rosacea) as well. It may respond to topical and/or systemic medication  and can flare with stress, sun exposure, alcohol, exercise, topical steroids (including hydrocortisone/cortisone 10) and some foods.  Daily application of broad spectrum spf 30+ sunscreen to face is recommended to reduce flares.    Treatment Plan BBL today    [place] BBL - 06/30/23 1500      Patient Details   Current Skin Care: face, neck, chest    Skin Type: I    Anesthestic Cream Applied: No    Photo Takes: Yes    Consent Signed: Yes    Improvement from Previous Treatment: Yes      Treatment Details   Date: 06/30/23    Area: face, neck, chest    Filter: 1st Pass, 2nd Pass;3rd Pass;4th Pass        2nd Pass   Location: F   Vascular   Device: 560 filter    BBL j/cm2: 17    PW Msec Sec: 15    Cooling Temp: 20    Pulses: 109    15x15: this crystal used      3rd Pass   Location: Other   neck and chest, vascular and pigmented   Device: 560 filter    BBL j/cm2: 9    PW Msec Sec: 10    Cooling Temp: 15    Pulses: 193    15x45: This crystal  used     Patient tolerated the procedure well.   Wynelle Link avoidance was stressed. The patient will call with any problems, questions or concerns prior to their next appointment.  Laser safety: Patient was advised in laser safety.  Patient was fitted with laser safety goggles and advised to keep eyes closed during procedure with goggles on. Staff and provider ensured that patient and their own safety goggles were also on and eyes protected during procedure. Laser room door was secured and locked from the inside. Laser room door has laser safety sign affixed to the outside of the door.       Return if symptoms worsen or fail to improve.  I, [name], RMA, am acting as scribe for Willeen Niece, MD .   Documentation: I have reviewed the above documentation for accuracy and completeness, and I agree with the above.  Willeen Niece, MD

## 2023-06-30 NOTE — Patient Instructions (Signed)

## 2023-07-01 ENCOUNTER — Telehealth: Payer: Self-pay

## 2023-07-01 NOTE — Telephone Encounter (Signed)
 Patient did have some swelling from yesterdays BBL treatment but otherwise doing fine./sh

## 2023-07-02 ENCOUNTER — Ambulatory Visit
Admission: RE | Admit: 2023-07-02 | Discharge: 2023-07-02 | Disposition: A | Discharge status: Home / Self Care | Source: Ambulatory Visit | Attending: Student in an Organized Health Care Education/Training Program | Admitting: Student in an Organized Health Care Education/Training Program

## 2023-07-02 ENCOUNTER — Encounter: Payer: Self-pay | Admitting: Student in an Organized Health Care Education/Training Program

## 2023-07-02 ENCOUNTER — Ambulatory Visit
Attending: Student in an Organized Health Care Education/Training Program | Admitting: Student in an Organized Health Care Education/Training Program

## 2023-07-02 VITALS — BP 118/80 | HR 72 | Temp 97.3°F | Resp 16 | Ht 66.0 in | Wt 139.0 lb

## 2023-07-02 DIAGNOSIS — M47816 Spondylosis without myelopathy or radiculopathy, lumbar region: Secondary | ICD-10-CM | POA: Insufficient documentation

## 2023-07-02 MED ORDER — DEXAMETHASONE SODIUM PHOSPHATE 10 MG/ML IJ SOLN
20.0000 mg | Freq: Once | INTRAMUSCULAR | Status: AC
  Administered 2023-07-02: 20 mg

## 2023-07-02 MED ORDER — MIDAZOLAM HCL 2 MG/2ML IJ SOLN
0.5000 mg | Freq: Once | INTRAMUSCULAR | Status: AC
  Administered 2023-07-02: 1 mg via INTRAVENOUS

## 2023-07-02 MED ORDER — DEXAMETHASONE SODIUM PHOSPHATE 10 MG/ML IJ SOLN
INTRAMUSCULAR | Status: AC
  Filled 2023-07-02: qty 2

## 2023-07-02 MED ORDER — ROPIVACAINE HCL 2 MG/ML IJ SOLN
INTRAMUSCULAR | Status: AC
  Filled 2023-07-02: qty 20

## 2023-07-02 MED ORDER — LIDOCAINE HCL 2 % IJ SOLN
20.0000 mL | Freq: Once | INTRAMUSCULAR | Status: AC
  Administered 2023-07-02: 400 mg

## 2023-07-02 MED ORDER — LIDOCAINE HCL 2 % IJ SOLN
INTRAMUSCULAR | Status: AC
  Filled 2023-07-02: qty 20

## 2023-07-02 MED ORDER — LACTATED RINGERS IV SOLN
Freq: Once | INTRAVENOUS | Status: AC
Start: 2023-07-02 — End: 2023-07-02

## 2023-07-02 MED ORDER — ROPIVACAINE HCL 2 MG/ML IJ SOLN
18.0000 mL | Freq: Once | INTRAMUSCULAR | Status: AC
  Administered 2023-07-02: 18 mL via PERINEURAL

## 2023-07-02 MED ORDER — MIDAZOLAM HCL 2 MG/2ML IJ SOLN
INTRAMUSCULAR | Status: AC
  Filled 2023-07-02: qty 2

## 2023-07-02 NOTE — Progress Notes (Signed)
 Safety precautions to be maintained throughout the outpatient stay will include: orient to surroundings, keep bed in low position, maintain call bell within reach at all times, provide assistance with transfer out of bed and ambulation.

## 2023-07-02 NOTE — Patient Instructions (Signed)

## 2023-07-02 NOTE — Progress Notes (Signed)
 PROVIDER NOTE: Interpretation of information contained herein should be left to medically-trained personnel. Specific patient instructions are provided elsewhere under "Patient Instructions" section of medical record. This document was created in part using STT-dictation technology, any transcriptional errors that may result from this process are unintentional.  Patient: Jessica Haas Type: Established DOB: [birth year] MRN: [id number] PCP: Mariam Dollar, MD  Service: Procedure DOS: 07/02/2023 Setting: Ambulatory Location: Ambulatory outpatient facility Delivery: Face-to-face Provider: Edward Jolly, MD Specialty: Interventional Pain Management Specialty designation: 09 Location: Outpatient facility Ref. Prov.: No ref. provider found       Interventional Therapy   Type: Lumbar Facet, Medial Branch Block(s) (w/ fluoroscopic mapping) #2  Laterality: Bilateral  Level: L3, L4, and L5 Medial Branch Level(s). Injecting these levels blocks the L3-4 and L4-5 lumbar facet joints. Imaging: Fluoroscopic guidance         Anesthesia: Local anesthesia (1-2% Lidocaine) YesSedation: Minimal Sedation                       DOS: 07/02/2023 Performed by: Edward Jolly, MD  Primary Purpose: Diagnostic/Therapeutic Indications: Low back pain severe enough to impact quality of life or function. 1. Lumbar facet arthropathy   2. Lumbar spondylosis    NAS-11 Pain score:   Pre-procedure: 5 /10   Post-procedure: 5 /10     Position / Prep / Materials:  Position: Prone  Prep solution: ChloraPrep (2% chlorhexidine gluconate and 70% isopropyl alcohol) Area Prepped: Posterolateral Lumbosacral Spine (Wide prep: From the lower border of the scapula down to the end of the tailbone and from flank to flank.)  Materials:  Tray: Block Needle(s):  Type: Spinal  Gauge (G): 22  Length: 3.5-in Qty: 2      H&P (Pre-op Assessment):  [name] is a 73 y.o. (year old), female patient, seen today for interventional  treatment. She  has a past surgical history that includes Abdominal hysterectomy; Carpal tunnel release; Foot Fusion (Left); Cataract extraction w/PHACO (Left, 12/19/2015); Cataract extraction w/PHACO (Right, 02/13/2016); Cervical fusion; and Bone excision (Right, 06/04/2018). [name] has a current medication list which includes the following prescription(s): acetaminophen, alprazolam, altreno, celecoxib, clobetasol ointment, fluoxetine, gabapentin, hydrochlorothiazide, ivermectin, mupirocin ointment, pitavastatin calcium, tizanidine, trazodone, zolpidem, and estradiol, and the following Facility-Administered Medications: lactated ringers. Her primarily concern today is the Back Pain  Initial Vital Signs:  Pulse/HCG Rate: 77  Temp: (!) 97.3 F (36.3 C) Resp: 16 BP: 103/83 SpO2: 100 %  BMI: Estimated body mass index is 22.44 kg/m as calculated from the following:   Height as of this encounter: 5\' 6"  (1.676 m).   Weight as of this encounter: 139 lb (63 kg).  Risk Assessment: Allergies: Reviewed. She is allergic to doxycycline, erythromycin, lidocaine, losartan, rosuvastatin, tetracyclines & related, and thimerosal (thiomersal).  Allergy Precautions: None required Coagulopathies: Reviewed. None identified.  Blood-thinner therapy: None at this time Active Infection(s): Reviewed. None identified. [name] is afebrile  Site Confirmation: [name] was asked to confirm the procedure and laterality before marking the site Procedure checklist: Completed Consent: Before the procedure and under the influence of no sedative(s), amnesic(s), or anxiolytics, the patient was informed of the treatment options, risks and possible complications. To fulfill our ethical and legal obligations, as recommended by the American Medical Association's Code of Ethics, I have informed the patient of my clinical impression; the nature and purpose of the treatment or procedure; the risks, benefits, and possible  complications of the intervention; the alternatives, including doing nothing; the  risk(s) and benefit(s) of the alternative treatment(s) or procedure(s); and the risk(s) and benefit(s) of doing nothing. The patient was provided information about the general risks and possible complications associated with the procedure. These may include, but are not limited to: failure to achieve desired goals, infection, bleeding, organ or nerve damage, allergic reactions, paralysis, and death. In addition, the patient was informed of those risks and complications associated to Spine-related procedures, such as failure to decrease pain; infection (i.e.: Meningitis, epidural or intraspinal abscess); bleeding (i.e.: epidural hematoma, subarachnoid hemorrhage, or any other type of intraspinal or peri-dural bleeding); organ or nerve damage (i.e.: Any type of peripheral nerve, nerve root, or spinal cord injury) with subsequent damage to sensory, motor, and/or autonomic systems, resulting in permanent pain, numbness, and/or weakness of one or several areas of the body; allergic reactions; (i.e.: anaphylactic reaction); and/or death. Furthermore, the patient was informed of those risks and complications associated with the medications. These include, but are not limited to: allergic reactions (i.e.: anaphylactic or anaphylactoid reaction(s)); adrenal axis suppression; blood sugar elevation that in diabetics may result in ketoacidosis or comma; water retention that in patients with history of congestive heart failure may result in shortness of breath, pulmonary edema, and decompensation with resultant heart failure; weight gain; swelling or edema; medication-induced neural toxicity; particulate matter embolism and blood vessel occlusion with resultant organ, and/or nervous system infarction; and/or aseptic necrosis of one or more joints. Finally, the patient was informed that Medicine is not an exact science; therefore, there is also  the possibility of unforeseen or unpredictable risks and/or possible complications that may result in a catastrophic outcome. The patient indicated having understood very clearly. We have given the patient no guarantees and we have made no promises. Enough time was given to the patient to ask questions, all of which were answered to the patient's satisfaction. Ms. Lamere has indicated that she wanted to continue with the procedure. Attestation: I, the ordering provider, attest that I have discussed with the patient the benefits, risks, side-effects, alternatives, likelihood of achieving goals, and potential problems during recovery for the procedure that I have provided informed consent. Date  Time: 07/02/2023 12:33 PM   Pre-Procedure Preparation:  Monitoring: As per clinic protocol. Respiration, ETCO2, SpO2, BP, heart rate and rhythm monitor placed and checked for adequate function Safety Precautions: Patient was assessed for positional comfort and pressure points before starting the procedure. Time-out: I initiated and conducted the "Time-out" before starting the procedure, as per protocol. The patient was asked to participate by confirming the accuracy of the "Time Out" information. Verification of the correct person, site, and procedure were performed and confirmed by me, the nursing staff, and the patient. "Time-out" conducted as per Joint Commission's Universal Protocol (UP.01.01.01). Time: 1322 Start Time: 1322 hrs.  Description of Procedure:          Laterality: (see above) Targeted Levels: (see above)  Safety Precautions: Aspiration looking for blood return was conducted prior to all injections. At no point did we inject any substances, as a needle was being advanced. Before injecting, the patient was told to immediately notify me if she was experiencing any new onset of "ringing in the ears, or metallic taste in the mouth". No attempts were made at seeking any paresthesias. Safe injection  practices and needle disposal techniques used. Medications properly checked for expiration dates. SDV (single dose vial) medications used. After the completion of the procedure, all disposable equipment used was discarded in the proper designated medical waste containers.  Local Anesthesia: Protocol guidelines were followed. The patient was positioned over the fluoroscopy table. The area was prepped in the usual manner. The time-out was completed. The target area was identified using fluoroscopy. A 12-in long, straight, sterile hemostat was used with fluoroscopic guidance to locate the targets for each level blocked. Once located, the skin was marked with an approved surgical skin marker. Once all sites were marked, the skin (epidermis, dermis, and hypodermis), as well as deeper tissues (fat, connective tissue and muscle) were infiltrated with a small amount of a short-acting local anesthetic, loaded on a 10cc syringe with a 25G, 1.5-in  Needle. An appropriate amount of time was allowed for local anesthetics to take effect before proceeding to the next step. Local Anesthetic: Lidocaine 2.0% The unused portion of the local anesthetic was discarded in the proper designated containers. Technical description of process:   L3 Medial Branch Nerve Block (MBB): The target area for the L3 medial branch is at the junction of the postero-lateral aspect of the superior articular process and the superior, posterior, and medial edge of the transverse process of L4. Under fluoroscopic guidance, a Quincke needle was inserted until contact was made with os over the superior postero-lateral aspect of the pedicular shadow (target area). After negative aspiration for blood, 2mL of the nerve block solution was injected without difficulty or complication. The needle was removed intact. L4 Medial Branch Nerve Block (MBB): The target area for the L4 medial branch is at the junction of the postero-lateral aspect of the superior  articular process and the superior, posterior, and medial edge of the transverse process of L5. Under fluoroscopic guidance, a Quincke needle was inserted until contact was made with os over the superior postero-lateral aspect of the pedicular shadow (target area). After negative aspiration for blood, 2 mL of the nerve block solution was injected without difficulty or complication. The needle was removed intact. L5 Medial Branch Nerve Block (MBB): The target area for the L5 medial branch is at the junction of the postero-lateral aspect of the superior articular process and the superior, posterior, and medial edge of the sacral ala. Under fluoroscopic guidance, a Quincke needle was inserted until contact was made with os over the superior postero-lateral aspect of the pedicular shadow (target area). After negative aspiration for blood, 2mL of the nerve block solution was injected without difficulty or complication. The needle was removed intact.  Once the entire procedure was completed, the treated area was cleaned, making sure to leave some of the prepping solution back to take advantage of its long term bactericidal properties.         Illustration of the posterior view of the lumbar spine and the posterior neural structures. Laminae of L2 through S1 are labeled. DPRL5, dorsal primary ramus of L5; DPRS1, dorsal primary ramus of S1; DPR3, dorsal primary ramus of L3; FJ, facet (zygapophyseal) joint L3-L4; I, inferior articular process of L4; LB1, lateral branch of dorsal primary ramus of L1; IAB, inferior articular branches from L3 medial branch (supplies L4-L5 facet joint); IBP, intermediate branch plexus; MB3, medial branch of dorsal primary ramus of L3; NR3, third lumbar nerve root; S, superior articular process of L5; SAB, superior articular branches from L4 (supplies L4-5 facet joint also); TP3, transverse process of L3.   Facet Joint Innervation (* possible contribution)  L1-2 T12, L1 (L2*)  Medial  Branch  L2-3 L1, L2 (L3*)         "          "  L3-4 L2, L3 (L4*)         "          "  L4-5 L3, L4 (L5*)         "          "  L5-S1 L4, L5, S1          "          "    Vitals:   07/02/23 1310 07/02/23 1315 07/02/23 1320 07/02/23 1330  BP: 118/81 119/86 121/83 118/80  Pulse: 75 76 73 72  Resp: 15 16 15 16   Temp:      TempSrc:      SpO2: 100% 98% 100% 99%  Weight:      Height:         End Time: 1326 hrs.  Imaging Guidance (Spinal):          Type of Imaging Technique: Fluoroscopy Guidance (Spinal) Indication(s): Fluoroscopy guidance for needle placement to enhance accuracy in procedures requiring precise needle localization for targeted delivery of medication in or near specific anatomical locations not easily accessible without such real-time imaging assistance. Exposure Time: Please see nurses notes. Contrast: None used. Fluoroscopic Guidance: I was personally present during the use of fluoroscopy. "Tunnel Vision Technique" used to obtain the best possible view of the target area. Parallax error corrected before commencing the procedure. "Direction-depth-direction" technique used to introduce the needle under continuous pulsed fluoroscopy. Once target was reached, antero-posterior, oblique, and lateral fluoroscopic projection used confirm needle placement in all planes. Images permanently stored in EMR. Interpretation: No contrast injected. I personally interpreted the imaging intraoperatively. Adequate needle placement confirmed in multiple planes. Permanent images saved into the patient's record.  Post-operative Assessment:  Post-procedure Vital Signs:  Pulse/HCG Rate: 72  Temp: (!) 97.3 F (36.3 C) Resp: 16 BP: 118/80 SpO2: 99 %  EBL: None  Complications: No immediate post-treatment complications observed by team, or reported by patient.  Note: The patient tolerated the entire procedure well. A repeat set of vitals were taken after the procedure and the patient was kept  under observation following institutional policy, for this type of procedure. Post-procedural neurological assessment was performed, showing return to baseline, prior to discharge. The patient was provided with post-procedure discharge instructions, including a section on how to identify potential problems. Should any problems arise concerning this procedure, the patient was given instructions to immediately contact us, at any time, without hesitation. In any case, we plan to contact the patient by telephone for a follow-up status report regarding this interventional procedure.  Comments:  No additional relevant information.  Plan of Care (POC)  Orders:  Orders Placed This Encounter  Procedures   DG PAIN CLIN[facility] C-ARM 1-60 MIN NO REPORT    Intraoperative interpretation by procedural physician at [facility].    Standing Status:   Standing    Number of Occurrences:   1    Reason for exam::   Assistance in needle guidance and placement for procedures requiring needle placement in or near specific anatomical locations not easily accessible without such assistance.     Medications ordered for procedure: Meds ordered this encounter  Medications   lidocaine (XYLOCAINE) 2 % (with pres) injection 400 mg   lactated ringers infusion   midazolam (VERSED) injection 0.5-2 mg    Make sure Flumazenil is available in the pyxis when using this medication. If oversedation occurs, administer 0.2 mg IV over 15 sec. If after 45 sec no response, administer  0.2 mg again over 1 min; may repeat at 1 min intervals; not to exceed 4 doses (1 mg)   ropivacaine (PF) 2 mg/mL (0.2%) (NAROPIN) injection 18 mL   dexamethasone (DECADRON) injection 20 mg   Medications administered: We administered lidocaine, lactated ringers, midazolam, ropivacaine (PF) 2 mg/mL (0.2%), and dexamethasone.  See the medical record for exact dosing, route, and time of administration.  Follow-up plan:   Return in about 2 weeks  (around 07/16/2023) for VV PPE.       BLF L3,4,5 05/26/23, #2 07/02/23    Recent Visits Date Type Provider Dept  06/11/23 Office Visit Edward Jolly, MD [facility]  05/26/23 Procedure visit Edward Jolly, MD [facility]  05/06/23 Office Visit Edward Jolly, MD [facility]  Showing recent visits within past 90 days and meeting all other requirements Today's Visits Date Type Provider Dept  07/02/23 Procedure visit Edward Jolly, MD [facility]  Showing today's visits and meeting all other requirements Future Appointments Date Type Provider Dept  07/23/23 Appointment Edward Jolly, MD [facility]  Showing future appointments within next 90 days and meeting all other requirements  Disposition: Discharge home  Discharge (Date  Time): 07/02/2023; 1335 hrs.   Primary Care Physician: Mariam Dollar, MD Location: [facility] Note by: Edward Jolly, MD (TTS technology used. I apologize for any typographical errors that were not detected and corrected.) Date: 07/02/2023; Time: 1:49 PM  Disclaimer:  Medicine is not an Visual merchandiser. The only guarantee in medicine is that nothing is guaranteed. It is important to note that the decision to proceed with this intervention was based on the information collected from the patient. The Data and conclusions were drawn from the patient's questionnaire, the interview, and the physical examination. Because the information was provided in large part by the patient, it cannot be guaranteed that it has not been purposely or unconsciously manipulated. Every effort has been made to obtain as much relevant data as possible for this evaluation. It is important to note that the conclusions that lead to this procedure are derived in large part from the available data. Always take into account that the treatment will also be dependent on availability of resources and existing treatment guidelines,  considered by other Pain Management Practitioners as being common knowledge and practice, at the time of the intervention. For Medico-Legal purposes, it is also important to point out that variation in procedural techniques and pharmacological choices are the acceptable norm. The indications, contraindications, technique, and results of the above procedure should only be interpreted and judged by a Board-Certified Interventional Pain Specialist with extensive familiarity and expertise in the same exact procedure and technique.

## 2023-07-03 ENCOUNTER — Telehealth: Payer: Self-pay

## 2023-07-03 NOTE — Telephone Encounter (Signed)
 Attempt to contact for post-procedure f/u. No answer. LVM.

## 2023-07-23 ENCOUNTER — Ambulatory Visit
Attending: Student in an Organized Health Care Education/Training Program | Admitting: Student in an Organized Health Care Education/Training Program

## 2023-07-23 DIAGNOSIS — M5136 Other intervertebral disc degeneration, lumbar region with discogenic back pain only: Secondary | ICD-10-CM

## 2023-07-23 DIAGNOSIS — M47816 Spondylosis without myelopathy or radiculopathy, lumbar region: Secondary | ICD-10-CM | POA: Diagnosis not present

## 2023-07-23 NOTE — Progress Notes (Signed)
 Patient: Jessica Haas  Service Category: E/M  Provider: Edward Jolly, MD  DOB: 12-15-1950  DOS: 07/23/2023  Location: Office  MRN: [id number]  Setting: Ambulatory outpatient  Referring Provider: Mariam Dollar, MD  Type: Established Patient  Specialty: Interventional Pain Management  PCP: Mariam Dollar, MD  Location: Remote location  Delivery: TeleHealth     Virtual Encounter - Pain Management PROVIDER NOTE: Information contained herein reflects review and annotations entered in association with encounter. Interpretation of such information and data should be left to medically-trained personnel. Information provided to patient can be located elsewhere in the medical record under "Patient Instructions". Document created using STT-dictation technology, any transcriptional errors that may result from process are unintentional.    Contact & Pharmacy Preferred: [phone] Home: [phone] (home) Mobile: [phone] (mobile) E-mail: [email or web address]  [facility] - [place], [place] - [address] [place] [address] Phone: [phone] Fax: [phone]  [facility] - [place], [place] - [address] [place] [address] Phone: [phone] Fax: [phone]  [id number] - [place], [place] - [address] [place] [address] Phone: [phone] Fax: [phone]   Pre-screening  Jessica Haas offered "in-person" vs "virtual" encounter. She indicated preferring virtual for this encounter.   Reason COVID-19*  Social distancing based on CDC and AMA recommendations.   I contacted Jessica Haas on 07/23/2023 via telephone.      I clearly identified myself as Edward Jolly, MD. I verified that I was speaking with the correct person using two identifiers (Name: Jessica Haas, and date of birth: [birth year]).  Consent I sought verbal advanced consent from Jessica Haas for virtual  visit interactions. I informed Jessica Haas of possible security and privacy concerns, risks, and limitations associated with providing "not-in-person" medical evaluation and management services. I also informed Jessica Haas of the availability of "in-person" appointments. Finally, I informed her that there would be a charge for the virtual visit and that she could be  personally, fully or partially, financially responsible for it. Jessica Haas expressed understanding and agreed to proceed.   Historic Elements   Jessica Haas is a 73 y.o. year old, female patient evaluated today after our last contact on 07/02/2023. Jessica Haas  has a past medical history of Actinic keratosis (11/29/2014), Anemia (2008), Anxiety, Arthritis, Basal cell carcinoma of hand, Depression, GERD (gastroesophageal reflux disease), H/O abnormal mammogram (2008), H/O gastric ulcer (2008), High altitude sickness (2008), History of hiatal hernia, Neuromuscular disorder (HCC), Skin cancer (03/19/2016), Skin cancer (09/15/2018), Squamous cell carcinoma of skin (03/19/2016), Squamous cell carcinoma of skin (09/15/2018), Squamous cell carcinoma of skin (04/03/2021), Swelling, and Varicose vein of leg. She also  has a past surgical history that includes Abdominal hysterectomy; Carpal tunnel release; Foot Fusion (Left); Cataract extraction w/PHACO (Left, 12/19/2015); Cataract extraction w/PHACO (Right, 02/13/2016); Cervical fusion; and Bone excision (Right, 06/04/2018). Jessica Haas has a current medication list which includes the following prescription(s): acetaminophen, alprazolam, altreno, celecoxib, clobetasol ointment, fluoxetine, gabapentin, hydrochlorothiazide, ivermectin, mupirocin ointment, pitavastatin calcium, tizanidine, trazodone, zolpidem, and estradiol. She  reports that she quit smoking about 45 years ago. Her smoking use included cigarettes. She has quit using smokeless tobacco. She reports that she does not drink alcohol and  does not use drugs. Jessica Haas is allergic to doxycycline, erythromycin, lidocaine, losartan, rosuvastatin, tetracyclines & related, and thimerosal (thiomersal).  BMI: Estimated body mass index is 22.44 kg/m  as calculated from the following:   Height as of 07/02/23: 5\' 6"  (1.676 m).   Weight as of 07/02/23: 139 lb (63 kg). Last encounter: 06/11/2023. Last procedure: 07/02/2023.  HPI  Today, she is being contacted for a post-procedure assessment.    Post-procedure evaluation    Type: Lumbar Facet, Medial Branch Block(s) (w/ fluoroscopic mapping) #2  Laterality: Bilateral  Level: L3, L4, and L5 Medial Branch Level(s). Injecting these levels blocks the L3-4 and L4-5 lumbar facet joints. Imaging: Fluoroscopic guidance         Anesthesia: Local anesthesia (1-2% Lidocaine) YesSedation: Minimal Sedation                       DOS: 07/02/2023 Performed by: Edward Jolly, MD  Primary Purpose: Diagnostic/Therapeutic Indications: Low back pain severe enough to impact quality of life or function. 1. Lumbar facet arthropathy   2. Lumbar spondylosis    NAS-11 Pain score:   Pre-procedure: 5 /10   Post-procedure: 5 /10    Effectiveness:  Initial hour after procedure: 100 %  Subsequent 4-6 hours post-procedure: 100 %  Analgesia past initial 6 hours: 80 %  Ongoing improvement:  Analgesic:  80% Function: Somewhat improved ROM: Somewhat improved   Laboratory Chemistry Profile   Renal Lab Results  Component Value Date   BUN 19 04/27/2012   CREATININE 0.9 04/30/2018   GFR 79.26 04/27/2012    Hepatic Lab Results  Component Value Date   AST 29 04/30/2018   ALT 16 04/30/2018   ALBUMIN 4.1 04/27/2012   ALKPHOS 46 04/30/2018    Electrolytes Lab Results  Component Value Date   NA 138 04/30/2018   K 4.1 04/30/2018   CL 105 04/27/2012   CALCIUM 9.1 04/27/2012    Bone Lab Results  Component Value Date   VD25OH 29 04/30/2018    Inflammation (CRP: Acute Phase) (ESR: Chronic  Phase) No results found for: "CRP", "ESRSEDRATE", "LATICACIDVEN"       Note: Above Lab results reviewed.   Assessment  The primary encounter diagnosis was Lumbar facet arthropathy. Diagnoses of Lumbar spondylosis and Degeneration of intervertebral disc of lumbar region with discogenic back pain were also pertinent to this visit.  Plan of Care  Patient is status post 2 positive diagnostic lumbar facet medial branch nerve blocks done 05/26/2023 and 07/02/2023 both of which provided her with approximately 80% pain relief for 5 days.  Her pain relief gradually decreased thereafter.  We discussed lumbar radiofrequency ablation for the purpose of obtaining longer-term pain relief.  Risk and benefits reviewed and patient would like to proceed.  She would like to do this with IV Versed.   Orders:  Orders Placed This Encounter  Procedures   Radiofrequency,Lumbar    Standing Status:   Future    Expected Date:   08/13/2023    Expiration Date:   10/22/2023    Scheduling Instructions:     Side(s): Bilateral     Level(s) L3, L4, L5, Medial Branch Nerve(s)     Sedation: With Sedation- IV Versed     Scheduling Timeframe: As soon as pre-approved    Where will this procedure be performed?:   [facility]   Follow-up plan:   Return in about 3 weeks (around 08/13/2023) for B/L L3, 4, 5 RFA, in clinic IV Versed.      BLF L3,4,5 05/26/23, #2 07/02/23    Recent Visits Date Type Provider Dept  07/02/23 Procedure visit Edward Jolly, MD [facility]  06/11/23 Office Visit Edward Jolly, MD [facility]  05/26/23 Procedure visit Edward Jolly, MD [facility]  05/06/23 Office Visit Edward Jolly, MD [facility]  Showing recent visits within past 90 days and meeting all other requirements Today's Visits Date Type Provider Dept  07/23/23 Office Visit Edward Jolly, MD [facility]  Showing today's visits and meeting all other requirements Future  Appointments No visits were found meeting these conditions. Showing future appointments within next 90 days and meeting all other requirements  I discussed the assessment and treatment plan with the patient. The patient was provided an opportunity to ask questions and all were answered. The patient agreed with the plan and demonstrated an understanding of the instructions.  Patient advised to call back or seek an in-person evaluation if the symptoms or condition worsens.  Duration of encounter: .  Note by: Edward Jolly, MD Date: 07/23/2023; Time: 2:37 PM

## 2023-07-28 ENCOUNTER — Encounter: Payer: Self-pay | Admitting: Dermatology

## 2023-07-28 ENCOUNTER — Ambulatory Visit: Admitting: Dermatology

## 2023-07-28 DIAGNOSIS — B353 Tinea pedis: Secondary | ICD-10-CM

## 2023-07-28 DIAGNOSIS — L988 Other specified disorders of the skin and subcutaneous tissue: Secondary | ICD-10-CM

## 2023-07-28 DIAGNOSIS — L570 Actinic keratosis: Secondary | ICD-10-CM | POA: Diagnosis not present

## 2023-07-28 DIAGNOSIS — L578 Other skin changes due to chronic exposure to nonionizing radiation: Secondary | ICD-10-CM | POA: Diagnosis not present

## 2023-07-28 DIAGNOSIS — W908XXA Exposure to other nonionizing radiation, initial encounter: Secondary | ICD-10-CM | POA: Diagnosis not present

## 2023-07-28 DIAGNOSIS — Z7189 Other specified counseling: Secondary | ICD-10-CM

## 2023-07-28 MED ORDER — CICLOPIROX OLAMINE 0.77 % EX CREA: TOPICAL_CREAM | Freq: Two times a day (BID) | CUTANEOUS | 2 refills | Status: AC

## 2023-07-28 NOTE — Progress Notes (Signed)
 Follow-Up Visit   Subjective  Jessica Haas is a 73 y.o. female who presents for the following: Botox for facial elastosis  The following portions of the chart were reviewed this encounter and updated as appropriate: medications, allergies, medical history  Review of Systems:  No other skin or systemic complaints except as noted in HPI or Assessment and Plan.  Objective  Well appearing patient in no apparent distress; mood and affect are within normal limits.  A focused examination was performed of the face.  Relevant physical exam findings are noted in the Assessment and Plan.      Assessment & Plan    Facial Elastosis  Location: See attached image  Informed consent: Discussed risks (infection, pain, bleeding, bruising, swelling, allergic reaction, paralysis of nearby muscles, eyelid droop, double vision, neck weakness, difficulty breathing, headache, undesirable cosmetic result, and need for additional treatment) and benefits of the procedure, as well as the alternatives.  Informed consent was obtained.  Preparation: The area was cleansed with alcohol.  Procedure Details:  Botox was injected into the dermis with a 30-gauge needle. Pressure applied to any bleeding. Ice packs offered for swelling.  Lot Number:  [id number] Expiration:  05/2025  Total Units Injected:  70 units Forehead 6.5 units (could consider adding more to forehead since she feels that this wears off faster than rest of Botox) Frown Complex 22.5 units Brow Lift 2.5 units x 2, 1.25 units x 2 Crows feet 10 units x 2 Upper Lip 6 units DAO's 2.5 units x 2  Plan: Tylenol may be used for headache.  Allow 2 weeks before returning to clinic for additional dosing as needed. Patient will call for any problems.  [facility] Exam: maceration with fissure at right 4th webspace  Treatment Plan: Continue ciclopirox cr twice daily    ACTINIC DAMAGE WITH PRECANCEROUS ACTINIC KERATOSES Counseling for Topical  Chemotherapy Management: Patient exhibits: - Severe, confluent actinic changes with pre-cancerous actinic keratoses that is secondary to cumulative UV radiation exposure over time, at BL medial cheeks - Condition that is severe; chronic, not at goal. - diffuse scaly erythematous macules and papules with underlying dyspigmentation - Discussed Prescription "Field Treatment" topical Chemotherapy for Severe, Chronic Confluent Actinic Changes with Pre-Cancerous Actinic Keratoses Field treatment involves treatment of an entire area of skin that has confluent Actinic Changes (Sun/ Ultraviolet light damage) and PreCancerous Actinic Keratoses by method of PhotoDynamic Therapy (PDT) and/or prescription Topical Chemotherapy agents such as 5-fluorouracil, 5-fluorouracil/calcipotriene, and/or imiquimod.  The purpose is to decrease the number of clinically evident and subclinical PreCancerous lesions to prevent progression to development of skin cancer by chemically destroying early precancer changes that may or may not be visible.  It has been shown to reduce the risk of developing skin cancer in the treated area. As a result of treatment, redness, scaling, crusting, and open sores may occur during treatment course. One or more than one of these methods may be used and may have to be used several times to control, suppress and eliminate the PreCancerous changes. Discussed treatment course, expected reaction, and possible side effects. - Recommend daily broad spectrum sunscreen SPF 30+ to sun-exposed areas, reapply every 2 hours as needed.  - Staying in the shade or wearing long sleeves, sun glasses (UVA+UVB protection) and wide brim hats (4-inch brim around the entire circumference of the hat) are also recommended. - Call for new or changing lesions. - medial cheeks bilateral, scaly macules - patient will schedule PDT with red light  in the fall to the face  Reviewed course of treatment and expected reaction.   Patient advised to expect inflammation and crusting and advised that erosions are possible.  Patient advised to be diligent with sun protection during and after treatment. Counseled to keep medication out of reach of children and pets.   Return for as scheduled, Botox.  Anise Salvo, RMA, am acting as scribe for Willeen Niece, MD .   Documentation: I have reviewed the above documentation for accuracy and completeness, and I agree with the above.  Willeen Niece, MD

## 2023-07-28 NOTE — Patient Instructions (Signed)

## 2023-08-04 ENCOUNTER — Ambulatory Visit: Payer: Medicare Other | Admitting: Dermatology

## 2023-08-25 ENCOUNTER — Ambulatory Visit: Admitting: Student in an Organized Health Care Education/Training Program

## 2023-08-27 ENCOUNTER — Ambulatory Visit: Payer: Medicare Other | Admitting: Dermatology

## 2023-09-02 ENCOUNTER — Ambulatory Visit: Payer: Medicare Other | Admitting: Dermatology

## 2023-09-03 ENCOUNTER — Ambulatory Visit
Admission: RE | Admit: 2023-09-03 | Discharge: 2023-09-03 | Disposition: A | Discharge status: Home / Self Care | Source: Ambulatory Visit | Attending: Student in an Organized Health Care Education/Training Program | Admitting: Student in an Organized Health Care Education/Training Program

## 2023-09-03 ENCOUNTER — Encounter: Payer: Self-pay | Admitting: Student in an Organized Health Care Education/Training Program

## 2023-09-03 ENCOUNTER — Ambulatory Visit: Admitting: Dermatology

## 2023-09-03 ENCOUNTER — Ambulatory Visit
Attending: Student in an Organized Health Care Education/Training Program | Admitting: Student in an Organized Health Care Education/Training Program

## 2023-09-03 VITALS — BP 125/79 | HR 73 | Temp 97.3°F | Resp 16 | Ht 66.0 in | Wt 129.0 lb

## 2023-09-03 DIAGNOSIS — M5136 Other intervertebral disc degeneration, lumbar region with discogenic back pain only: Secondary | ICD-10-CM | POA: Insufficient documentation

## 2023-09-03 DIAGNOSIS — M47816 Spondylosis without myelopathy or radiculopathy, lumbar region: Secondary | ICD-10-CM

## 2023-09-03 DIAGNOSIS — M1711 Unilateral primary osteoarthritis, right knee: Secondary | ICD-10-CM | POA: Diagnosis present

## 2023-09-03 MED ORDER — ROPIVACAINE HCL 2 MG/ML IJ SOLN
18.0000 mL | Freq: Once | INTRAMUSCULAR | Status: AC
  Administered 2023-09-03: 18 mL via PERINEURAL
  Filled 2023-09-03: qty 20

## 2023-09-03 MED ORDER — MIDAZOLAM HCL 2 MG/2ML IJ SOLN
0.5000 mg | Freq: Once | INTRAMUSCULAR | Status: AC
  Administered 2023-09-03: 2 mg via INTRAVENOUS
  Filled 2023-09-03: qty 2

## 2023-09-03 MED ORDER — METHYLPREDNISOLONE ACETATE 40 MG/ML IJ SUSP
40.0000 mg | Freq: Once | INTRAMUSCULAR | Status: AC
  Administered 2023-09-03: 40 mg via INTRA_ARTICULAR

## 2023-09-03 MED ORDER — METHYLPREDNISOLONE ACETATE 40 MG/ML IJ SUSP
INTRAMUSCULAR | Status: AC
  Filled 2023-09-03: qty 1

## 2023-09-03 MED ORDER — DEXAMETHASONE SODIUM PHOSPHATE 10 MG/ML IJ SOLN
20.0000 mg | Freq: Once | INTRAMUSCULAR | Status: AC
  Administered 2023-09-03: 20 mg
  Filled 2023-09-03: qty 2

## 2023-09-03 MED ORDER — LIDOCAINE HCL 2 % IJ SOLN
20.0000 mL | Freq: Once | INTRAMUSCULAR | Status: AC
  Administered 2023-09-03: 400 mg
  Filled 2023-09-03: qty 20

## 2023-09-03 MED ORDER — LACTATED RINGERS IV SOLN: Freq: Once | INTRAVENOUS | Status: AC

## 2023-09-03 MED ORDER — LIDOCAINE HCL (PF) 2 % IJ SOLN
INTRAMUSCULAR | Status: AC
  Filled 2023-09-03: qty 5

## 2023-09-03 NOTE — Progress Notes (Signed)
 PROVIDER NOTE: Interpretation of information contained herein should be left to medically-trained personnel. Specific patient instructions are provided elsewhere under "Patient Instructions" section of medical record. This document was created in part using STT-dictation technology, any transcriptional errors that may result from this process are unintentional.  Patient: Jessica Haas Type: Established DOB: 02-09-1951 MRN: [id number] PCP: No primary care provider on file.  Service: Procedure DOS: 09/03/2023 Setting: Ambulatory Location: Ambulatory outpatient facility Delivery: Face-to-face Provider: Cephus Collin, MD Specialty: Interventional Pain Management Specialty designation: 09 Location: Outpatient facility Ref. Prov.: No ref. provider found       Interventional Therapy   Procedure: Lumbar Facet, Medial Branch Radiofrequency Ablation (RFA) #1  Laterality: Bilateral (-50)  Level: L3, L4, and L5 Medial Branch Level(s). These levels will denervate the L3-4 and L4-5 lumbar facet joints.  Imaging: Fluoroscopy-guided         Anesthesia: Local anesthesia (1-2% Lidocaine ) Sedation: Minimal Sedation                       DOS: 09/03/2023  Performed by: Cephus Collin, MD  Purpose: Therapeutic/Palliative Indications: Low back pain severe enough to impact quality of life or function. Indications: 1. Lumbar facet arthropathy   2. Lumbar spondylosis   3. Degeneration of intervertebral disc of lumbar region with discogenic back pain    Jessica Haas has been dealing with the above chronic pain for longer than three months and has either failed to respond, was unable to tolerate, or simply did not get enough benefit from other more conservative therapies including, but not limited to: 1. Over-the-counter medications 2. Anti-inflammatory medications 3. Muscle relaxants 4. Membrane stabilizers 5. Opioids 6. Physical therapy and/or chiropractic manipulation 7. Modalities (Heat, ice, etc.) 8.  Invasive techniques such as nerve blocks. Jessica Haas has attained more than 50% relief of the pain from a series of diagnostic injections conducted in separate occasions.  Pain Score: Pre-procedure: 5 /10 Post-procedure: 5 /10     Position / Prep / Materials:  Position: Prone  Prep solution: ChloraPrep (2% chlorhexidine gluconate and 70% isopropyl alcohol) Prep Area: Entire Lumbosacral Region (Lower back from mid-thoracic region to end of tailbone and from flank to flank.) Materials:  Tray: RFA (Radiofrequency) tray Needle(s):  Type: RFA (Teflon-coated radiofrequency ablation needles) Gauge (G): 22  Length: Regular (10cm) Qty: 3     H&P (Pre-op Assessment):  Jessica Haas is a 73 y.o. (year old), female patient, seen today for interventional treatment. She  has a past surgical history that includes Abdominal hysterectomy; Carpal tunnel release; Foot Fusion (Left); Cataract extraction w/PHACO (Left, 12/19/2015); Cataract extraction w/PHACO (Right, 02/13/2016); Cervical fusion; and Bone excision (Right, 06/04/2018). Jessica Haas has a current medication list which includes the following prescription(s): acetaminophen , alprazolam , altreno , celecoxib , ciclopirox , estradiol, fluoxetine , gabapentin, hydrochlorothiazide, ivermectin , mupirocin  ointment, pitavastatin calcium, tizanidine , trazodone, zolpidem , and clobetasol  ointment, and the following Facility-Administered Medications: lactated ringers . Her primarily concern today is the Back Pain  Initial Vital Signs:  Pulse/HCG Rate: 73ECG Heart Rate: 74 Temp: (!) 97.3 F (36.3 C) Resp: 16 BP: 111/78 SpO2: 100 %  BMI: Estimated body mass index is 20.82 kg/m as calculated from the following:   Height as of this encounter: 5\' 6"  (1.676 m).   Weight as of this encounter: 129 lb (58.5 kg).  Risk Assessment: Allergies: Reviewed. She is allergic to doxycycline, erythromycin, lidocaine , losartan, rosuvastatin, tetracyclines & related, and  thimerosal (thiomersal).  Allergy Precautions: None required Coagulopathies: Reviewed. None identified.  Blood-thinner therapy: None  at this time Active Infection(s): Reviewed. None identified. Jessica Haas is afebrile  Site Confirmation: Jessica Haas was asked to confirm the procedure and laterality before marking the site Procedure checklist: Completed Consent: Before the procedure and under the influence of no sedative(s), amnesic(s), or anxiolytics, the patient was informed of the treatment options, risks and possible complications. To fulfill our ethical and legal obligations, as recommended by the American Medical Association's Code of Ethics, I have informed the patient of my clinical impression; the nature and purpose of the treatment or procedure; the risks, benefits, and possible complications of the intervention; the alternatives, including doing nothing; the risk(s) and benefit(s) of the alternative treatment(s) or procedure(s); and the risk(s) and benefit(s) of doing nothing. The patient was provided information about the general risks and possible complications associated with the procedure. These may include, but are not limited to: failure to achieve desired goals, infection, bleeding, organ or nerve damage, allergic reactions, paralysis, and death. In addition, the patient was informed of those risks and complications associated to Spine-related procedures, such as failure to decrease pain; infection (i.e.: Meningitis, epidural or intraspinal abscess); bleeding (i.e.: epidural hematoma, subarachnoid hemorrhage, or any other type of intraspinal or peri-dural bleeding); organ or nerve damage (i.e.: Any type of peripheral nerve, nerve root, or spinal cord injury) with subsequent damage to sensory, motor, and/or autonomic systems, resulting in permanent pain, numbness, and/or weakness of one or several areas of the body; allergic reactions; (i.e.: anaphylactic reaction); and/or  death. Furthermore, the patient was informed of those risks and complications associated with the medications. These include, but are not limited to: allergic reactions (i.e.: anaphylactic or anaphylactoid reaction(s)); adrenal axis suppression; blood sugar elevation that in diabetics may result in ketoacidosis or comma; water retention that in patients with history of congestive heart failure may result in shortness of breath, pulmonary edema, and decompensation with resultant heart failure; weight gain; swelling or edema; medication-induced neural toxicity; particulate matter embolism and blood vessel occlusion with resultant organ, and/or nervous system infarction; and/or aseptic necrosis of one or more joints. Finally, the patient was informed that Medicine is not an exact science; therefore, there is also the possibility of unforeseen or unpredictable risks and/or possible complications that may result in a catastrophic outcome. The patient indicated having understood very clearly. We have given the patient no guarantees and we have made no promises. Enough time was given to the patient to ask questions, all of which were answered to the patient's satisfaction. Ms. Guider has indicated that she wanted to continue with the procedure. Attestation: I, the ordering provider, attest that I have discussed with the patient the benefits, risks, side-effects, alternatives, likelihood of achieving goals, and potential problems during recovery for the procedure that I have provided informed consent. Date  Time: 09/03/2023 12:58 PM  Pre-Procedure Preparation:  Monitoring: As per clinic protocol. Respiration, ETCO2, SpO2, BP, heart rate and rhythm monitor placed and checked for adequate function Safety Precautions: Patient was assessed for positional comfort and pressure points before starting the procedure. Time-out: I initiated and conducted the "Time-out" before starting the procedure, as per protocol. The  patient was asked to participate by confirming the accuracy of the "Time Out" information. Verification of the correct person, site, and procedure were performed and confirmed by me, the nursing staff, and the patient. "Time-out" conducted as per Joint Commission's Universal Protocol (UP.01.01.01). Time: 1335 Start Time: 1335 hrs.  Description of Procedure:          Laterality: See above.  Levels:  See above. Safety Precautions: Aspiration looking for blood return was conducted prior to all injections. At no point did we inject any substances, as a needle was being advanced. Before injecting, the patient was told to immediately notify me if she was experiencing any new onset of "ringing in the ears, or metallic taste in the mouth". No attempts were made at seeking any paresthesias. Safe injection practices and needle disposal techniques used. Medications properly checked for expiration dates. SDV (single dose vial) medications used. After the completion of the procedure, all disposable equipment used was discarded in the proper designated medical waste containers. Local Anesthesia: Protocol guidelines were followed. The patient was positioned over the fluoroscopy table. The area was prepped in the usual manner. The time-out was completed. The target area was identified using fluoroscopy. A 12-in long, straight, sterile hemostat was used with fluoroscopic guidance to locate the targets for each level blocked. Once located, the skin was marked with an approved surgical skin marker. Once all sites were marked, the skin (epidermis, dermis, and hypodermis), as well as deeper tissues (fat, connective tissue and muscle) were infiltrated with a small amount of a short-acting local anesthetic, loaded on a 10cc syringe with a 25G, 1.5-in  Needle. An appropriate amount of time was allowed for local anesthetics to take effect before proceeding to the next step. Technical description of process:  Radiofrequency Ablation  (RFA)  L3 Medial Branch Nerve RFA: The target area for the L3 medial branch is at the junction of the postero-lateral aspect of the superior articular process and the superior, posterior, and medial edge of the transverse process of L4. Under fluoroscopic guidance, a Radiofrequency needle was inserted until contact was made with os over the superior postero-lateral aspect of the pedicular shadow (target area). Sensory and motor testing was conducted to properly adjust the position of the needle. Once satisfactory placement of the needle was achieved, the numbing solution was slowly injected after negative aspiration for blood. 2.0 mL of the nerve block solution was injected without difficulty or complication. After waiting for at least 3 minutes, the ablation was performed. Once completed, the needle was removed intact. L4 Medial Branch Nerve RFA: The target area for the L4 medial branch is at the junction of the postero-lateral aspect of the superior articular process and the superior, posterior, and medial edge of the transverse process of L5. Under fluoroscopic guidance, a Radiofrequency needle was inserted until contact was made with os over the superior postero-lateral aspect of the pedicular shadow (target area). Sensory and motor testing was conducted to properly adjust the position of the needle. Once satisfactory placement of the needle was achieved, the numbing solution was slowly injected after negative aspiration for blood. 2.0 mL of the nerve block solution was injected without difficulty or complication. After waiting for at least 3 minutes, the ablation was performed. Once completed, the needle was removed intact. L5 Medial Branch Nerve RFA: The target area for the L5 medial branch is at the junction of the postero-lateral aspect of the superior articular process of S1 and the superior, posterior, and medial edge of the sacral ala. Under fluoroscopic guidance, a Radiofrequency needle was inserted  until contact was made with os over the superior postero-lateral aspect of the pedicular shadow (target area). Sensory and motor testing was conducted to properly adjust the position of the needle. Once satisfactory placement of the needle was achieved, the numbing solution was slowly injected after negative aspiration for blood. 2.0 mL of the  nerve block solution was injected without difficulty or complication. After waiting for at least 3 minutes, the ablation was performed. Once completed, the needle was removed intact.  Radiofrequency lesioning (ablation):  Radiofrequency Generator: Medtronic AccurianTM AG 1000 RF Generator Sensory Stimulation Parameters: 50 Hz was used to locate & identify the nerve, making sure that the needle was positioned such that there was no sensory stimulation below 0.3 V or above 0.7 V. Motor Stimulation Parameters: 2 Hz was used to evaluate the motor component. Care was taken not to lesion any nerves that demonstrated motor stimulation of the lower extremities at an output of less than 2.5 times that of the sensory threshold, or a maximum of 2.0 V. Lesioning Technique Parameters: Standard Radiofrequency settings. (Not bipolar or pulsed.) Temperature Settings: 80 degrees C Lesioning time: 60 seconds Stationary intra-operative compliance: Compliant  Once the entire procedure was completed, the treated area was cleaned, making sure to leave some of the prepping solution back to take advantage of its long term bactericidal properties.    Illustration of the posterior view of the lumbar spine and the posterior neural structures. Laminae of L2 through S1 are labeled. DPRL5, dorsal primary ramus of L5; DPRS1, dorsal primary ramus of S1; DPR3, dorsal primary ramus of L3; FJ, facet (zygapophyseal) joint L3-L4; I, inferior articular process of L4; LB1, lateral branch of dorsal primary ramus of L1; IAB, inferior articular branches from L3 medial branch (supplies L4-L5 facet joint);  IBP, intermediate branch plexus; MB3, medial branch of dorsal primary ramus of L3; NR3, third lumbar nerve root; S, superior articular process of L5; SAB, superior articular branches from L4 (supplies L4-5 facet joint also); TP3, transverse process of L3.  Facet Joint Innervation (* possible contribution)  L1-2 T12, L1 (L2*)  Medial Branch  L2-3 L1, L2 (L3*)         "          "  L3-4 L2, L3 (L4*)         "          "  L4-5 L3, L4 (L5*)         "          "  L5-S1 L4, L5, S1          "          "    Vitals:   09/03/23 1345 09/03/23 1350 09/03/23 1355 09/03/23 1400  BP: 114/76 116/80 108/80 118/82  Pulse:      Resp: 11 18 18 15   Temp:      SpO2: 100% 99% 100% 99%  Weight:      Height:        Start Time: 1335 hrs. End Time: 1404 hrs.  Imaging Guidance (Spinal):          Type of Imaging Technique: Fluoroscopy Guidance (Spinal) Indication(s): Fluoroscopy guidance for needle placement to enhance accuracy in procedures requiring precise needle localization for targeted delivery of medication in or near specific anatomical locations not easily accessible without such real-time imaging assistance. Exposure Time: Please see nurses notes. Contrast: None used. Fluoroscopic Guidance: I was personally present during the use of fluoroscopy. "Tunnel Vision Technique" used to obtain the best possible view of the target area. Parallax error corrected before commencing the procedure. "Direction-depth-direction" technique used to introduce the needle under continuous pulsed fluoroscopy. Once target was reached, antero-posterior, oblique, and lateral fluoroscopic projection used confirm needle placement in all planes. Images permanently stored in EMR. Interpretation: No contrast  injected. I personally interpreted the imaging intraoperatively. Adequate needle placement confirmed in multiple planes. Permanent images saved into the patient's record.  Antibiotic Prophylaxis:   Anti-infectives (From  admission, onward)    None      Indication(s): None identified   Post-operative Assessment:  Post-procedure Vital Signs:  Pulse/HCG Rate: 7371 Temp: (!) 97.3 F (36.3 C) Resp: 15 BP: 118/82 SpO2: 99 %  EBL: None  Complications: No immediate post-treatment complications observed by team, or reported by patient.  Note: The patient tolerated the entire procedure well. A repeat set of vitals were taken after the procedure and the patient was kept under observation following institutional policy, for this type of procedure. Post-procedural neurological assessment was performed, showing return to baseline, prior to discharge. The patient was provided with post-procedure discharge instructions, including a section on how to identify potential problems. Should any problems arise concerning this procedure, the patient was given instructions to immediately contact us , at any time, without hesitation. In any case, we plan to contact the patient by telephone for a follow-up status report regarding this interventional procedure.  Comments:  No additional relevant information.  Plan of Care (POC)  Orders:  Orders Placed This Encounter  Procedures   DG PA[facility] C-ARM 1-60 MIN NO REPORT    Intraoperative interpretation by procedural physician at [facility].    Standing Status:   Standing    Number of Occurrences:   1    Reason for exam::   Assistance in needle guidance and placement for procedures requiring needle placement in or near specific anatomical locations not easily accessible without such assistance.   Medications ordered for procedure: Meds ordered this encounter  Medications   lidocaine  (XYLOCAINE ) 2 % (with pres) injection 400 mg   lactated ringers  infusion   midazolam  (VERSED ) injection 0.5-2 mg    Make sure Flumazenil is available in the pyxis when using this medication. If oversedation occurs, administer 0.2 mg IV over 15 sec. If after 45 sec no response,  administer 0.2 mg again over 1 min; may repeat at 1 min intervals; not to exceed 4 doses (1 mg)   ropivacaine  (PF) 2 mg/mL (0.2%) (NAROPIN ) injection 18 mL   dexamethasone  (DECADRON ) injection 20 mg   Medications administered: We administered lidocaine , lactated ringers , midazolam , ropivacaine  (PF) 2 mg/mL (0.2%), and dexamethasone .  See the medical record for exact dosing, route, and time of administration.  Follow-up plan:   Return in about 6 weeks (around 10/15/2023) for PPE, F2F.       BLF L3,4,5 05/26/23, #2 07/02/23; B/L L3,4,5 RFA 09/03/23     Recent Visits Date Type Provider Dept  07/23/23 Office Visit Cephus Collin, MD [facility]  07/02/23 Procedure visit Cephus Collin, MD [facility]  06/11/23 Office Visit Cephus Collin, MD [facility]  Showing recent visits within past 90 days and meeting all other requirements Today's Visits Date Type Provider Dept  09/03/23 Procedure visit Cephus Collin, MD [facility]  Showing today's visits and meeting all other requirements Future Appointments Date Type Provider Dept  10/14/23 Appointment Cephus Collin, MD [facility]  Showing future appointments within next 90 days and meeting all other requirements  Disposition: Discharge home  Discharge (Date  Time): 09/03/2023;   hrs.   Primary Care Physician: No primary care provider on file. Location: [facility] Note by: Cephus Collin, MD (TTS technology used. I apologize for any typographical errors that were not detected and corrected.)  Date: 09/03/2023; Time: 2:10 PM  Disclaimer:  Medicine is not an Visual merchandiser. The only guarantee in medicine is that nothing is guaranteed. It is important to note that the decision to proceed with this intervention was based on the information collected from the patient. The Data and conclusions were drawn from the patient's questionnaire, the interview, and the physical  examination. Because the information was provided in large part by the patient, it cannot be guaranteed that it has not been purposely or unconsciously manipulated. Every effort has been made to obtain as much relevant data as possible for this evaluation. It is important to note that the conclusions that lead to this procedure are derived in large part from the available data. Always take into account that the treatment will also be dependent on availability of resources and existing treatment guidelines, considered by other Pain Management Practitioners as being common knowledge and practice, at the time of the intervention. For Medico-Legal purposes, it is also important to point out that variation in procedural techniques and pharmacological choices are the acceptable norm. The indications, contraindications, technique, and results of the above procedure should only be interpreted and judged by a Board-Certified Interventional Pain Specialist with extensive familiarity and expertise in the same exact procedure and technique.

## 2023-09-03 NOTE — Progress Notes (Signed)
 Safety precautions to be maintained throughout the outpatient stay will include: orient to surroundings, keep bed in low position, maintain call bell within reach at all times, provide assistance with transfer out of bed and ambulation.

## 2023-09-03 NOTE — Progress Notes (Signed)
 PROVIDER NOTE: Interpretation of information contained herein should be left to medically-trained personnel. Specific patient instructions are provided elsewhere under "Patient Instructions" section of medical record. This document was created in part using STT-dictation technology, any transcriptional errors that may result from this process are unintentional.  Patient: Jessica Haas Type: Established DOB: [birth year] MRN: [id number] PCP: No primary care provider on file.  Service: Procedure DOS: 09/03/2023 Setting: Ambulatory Location: Ambulatory outpatient facility Delivery: Face-to-face Provider: Cephus Collin, MD Specialty: Interventional Pain Management Specialty designation: 09 Location: Outpatient facility Ref. Prov.: No ref. provider found       Interventional Therapy   Type:  Steroid Intra-articular Knee Injection #1  Laterality: Right (-RT) Level/approach: Medial Imaging guidance: None required ([id number]) Anesthesia: Local anesthesia (1-2% Lidocaine ) Sedation: Minimal Sedation                       DOS: 09/03/2023  Performed by: Cephus Collin, MD  Purpose: Diagnostic/Therapeutic Indications: Knee arthralgia associated to osteoarthritis of the knee  NAS-11 score:   Pre-procedure: 5 /10   Post-procedure: 0-No pain/10     Pre-Procedure Preparation  Monitoring: As per clinic protocol.  Risk Assessment: Vitals:  [id number] body mass index is 20.82 kg/m as calculated from the following:   Height as of this encounter: 5\' 6"  (1.676 m).   Weight as of this encounter: 129 lb (58.5 kg)., Rate:73ECG Heart Rate: 74, BP:111/78, Resp:16, Temp:(!) 97.3 F (36.3 C), SpO2:100 %  Allergies: She is allergic to doxycycline, erythromycin, lidocaine , losartan, rosuvastatin, tetracyclines & related, and thimerosal (thiomersal).  Precautions: No additional precautions required  Blood-thinner(s): None at this time  Coagulopathies: Reviewed. None identified.   Active Infection(s):  Reviewed. None identified. Jessica Haas is afebrile   Location setting: Exam room Position: Sitting w/ knee bent 90 degrees Safety Precautions: Patient was assessed for positional comfort and pressure points before starting the procedure. Prepping solution: DuraPrep (Iodine  Povacrylex [0.7% available iodine ] and Isopropyl Alcohol, 74% w/w) Prep Area: Entire knee region Approach: percutaneous, just above the tibial plateau, lateral to the infrapatellar tendon. Intended target: Intra-articular knee space Materials: Tray: Block Needle(s): Regular Qty: 1/side Length: 1.5-inch Gauge: 25G (x1) + 22G (x1)  Meds ordered this encounter  Medications   lidocaine  (XYLOCAINE ) 2 % (with pres) injection 400 mg   lactated ringers  infusion   midazolam  (VERSED ) injection 0.5-2 mg    Make sure Flumazenil is available in the pyxis when using this medication. If oversedation occurs, administer 0.2 mg IV over 15 sec. If after 45 sec no response, administer 0.2 mg again over 1 min; may repeat at 1 min intervals; not to exceed 4 doses (1 mg)   ropivacaine  (PF) 2 mg/mL (0.2%) (NAROPIN ) injection 18 mL   dexamethasone  (DECADRON ) injection 20 mg   methylPREDNISolone acetate (DEPO-MEDROL) injection 40 mg    Orders Placed This Encounter  Procedures   DG PAIN CLINIC C-ARM 1-60 MIN NO REPORT    Intraoperative interpretation by procedural physician at [facility].    Standing Status:   Standing    Number of Occurrences:   1    Reason for exam::   Assistance in needle guidance and placement for procedures requiring needle placement in or near specific anatomical locations not easily accessible without such assistance.     Time-out: 1429 I initiated and conducted the "Time-out" before starting the procedure, as per protocol. The patient was asked to participate by confirming the accuracy of the "Time Out" information. Verification of the correct  person, site, and procedure were performed and confirmed by  me, the nursing staff, and the patient. "Time-out" conducted as per Joint Commission's Universal Protocol (UP.01.01.01). Procedure checklist: Completed  H&P (Pre-op  Assessment)  [name] is a 73 y.o. (year old), female patient, seen today for interventional treatment. She  has a past surgical history that includes Abdominal hysterectomy; Carpal tunnel release; Foot Fusion (Left); Cataract extraction w/PHACO (Left, 12/19/2015); Cataract extraction w/PHACO (Right, 02/13/2016); Cervical fusion; and Bone excision (Right, 06/04/2018). Jessica Haas has a current medication list which includes the following prescription(s): acetaminophen , alprazolam , altreno , celecoxib , ciclopirox , estradiol, fluoxetine , gabapentin, hydrochlorothiazide, ivermectin , mupirocin  ointment, pitavastatin calcium, tizanidine , trazodone, zolpidem , and clobetasol  ointment. Her primarily concern today is the Back Pain  She is allergic to doxycycline, erythromycin, lidocaine , losartan, rosuvastatin, tetracyclines & related, and thimerosal (thiomersal).   Last encounter: My last encounter with her was on 07/23/2023. Pertinent problems: Jessica Haas does not have any pertinent problems on file. Pain Assessment: Severity of Chronic pain is reported as a 5 /10. Location: Back Lower/down left leg, skipping thigh then to outer left shin. Onset: More than a month ago. Quality: Other (Comment) (pain). Timing: Constant. Modifying factor(s): tylenol , celebrex . Vitals:  height is 5\' 6"  (1.676 m) and weight is 129 lb (58.5 kg). Her temperature is 97.3 F (36.3 C) (abnormal). Her blood pressure is 125/79 and her pulse is 73. Her respiration is 16 and oxygen  saturation is 100%.   Reason for encounter: "interventional pain management therapy due pain of at least four (4) weeks in duration, with failure to respond and/or inability to tolerate more conservative care.  Site Confirmation: Jessica Haas was asked to confirm the procedure and laterality  before marking the site.  Consent: Before the procedure and under the influence of no sedative(s), amnesic(s), or anxiolytics, the patient was informed of the treatment options, risks and possible complications. To fulfill our ethical and legal obligations, as recommended by the American Medical Association's Code of Ethics, I have informed the patient of my clinical impression; the nature and purpose of the treatment or procedure; the risks, benefits, and possible complications of the intervention; the alternatives, including doing nothing; the risk(s) and benefit(s) of the alternative treatment(s) or procedure(s); and the risk(s) and benefit(s) of doing nothing. The patient was provided information about the general risks and possible complications associated with the procedure. These may include, but are not limited to: failure to achieve desired goals, infection, bleeding, organ or nerve damage, allergic reactions, paralysis, and death. In addition, the patient was informed of those risks and complications associated to Spine-related procedures, such as failure to decrease pain; infection (i.e.: Meningitis, epidural or intraspinal abscess); bleeding (i.e.: epidural hematoma, subarachnoid hemorrhage, or any other type of intraspinal or peri-dural bleeding); organ or nerve damage (i.e.: Any type of peripheral nerve, nerve root, or spinal cord injury) with subsequent damage to sensory, motor, and/or autonomic systems, resulting in permanent pain, numbness, and/or weakness of one or several areas of the body; allergic reactions; (i.e.: anaphylactic reaction); and/or death. Furthermore, the patient was informed of those risks and complications associated with the medications. These include, but are not limited to: allergic reactions (i.e.: anaphylactic or anaphylactoid reaction(s)); adrenal axis suppression; blood sugar elevation that in diabetics may result in ketoacidosis or comma; water retention that in  patients with history of congestive heart failure may result in shortness of breath, pulmonary edema, and decompensation with resultant heart failure; weight gain; swelling or edema; medication-induced neural toxicity; particulate matter embolism and blood  vessel occlusion with resultant organ, and/or nervous system infarction; and/or aseptic necrosis of one or more joints. Finally, the patient was informed that Medicine is not an exact science; therefore, there is also the possibility of unforeseen or unpredictable risks and/or possible complications that may result in a catastrophic outcome. The patient indicated having understood very clearly. We have given the patient no guarantees and we have made no promises. Enough time was given to the patient to ask questions, all of which were answered to the patient's satisfaction. Ms. Gogue has indicated that she wanted to continue with the procedure. Attestation: I, the ordering provider, attest that I have discussed with the patient the benefits, risks, side-effects, alternatives, likelihood of achieving goals, and potential problems during recovery for the procedure that I have provided informed consent.  Date  Time: 09/03/2023 12:58 PM  Description of procedure  Start Time: 1430 hrs  Local Anesthesia: Once the patient was positioned, prepped, and time-out was completed. The target area was identified located. The skin was marked with an approved surgical skin marker. Once marked, the skin (epidermis, dermis, and hypodermis), and deeper tissues (fat, connective tissue and muscle) were infiltrated with a small amount of a short-acting local anesthetic, loaded on a 10cc syringe with a 25G, 1.5-in  Needle. An appropriate amount of time was allowed for local anesthetics to take effect before proceeding to the next step. Local Anesthetic: Lidocaine  1-2% The unused portion of the local anesthetic was discarded in the proper designated containers. Safety  Precautions: Aspiration looking for blood return was conducted prior to all injections. At no point did I inject any substances, as a needle was being advanced. Before injecting, the patient was told to immediately notify me if she was experiencing any new onset of "ringing in the ears, or metallic taste in the mouth". No attempts were made at seeking any paresthesias. Safe injection practices and needle disposal techniques used. Medications properly checked for expiration dates. SDV (single dose vial) medications used. After the completion of the procedure, all disposable equipment used was discarded in the proper designated medical waste containers.  Technical description: Protocol guidelines were followed. After positioning, the target area was identified and prepped in the usual manner. Skin & deeper tissues infiltrated with local anesthetic. Appropriate amount of time allowed to pass for local anesthetics to take effect. Proper needle placement secured. Once satisfactory needle placement was confirmed, I proceeded to inject the desired solution in slow, incremental fashion, intermittently assessing for discomfort or any signs of abnormal or undesired spread of substance. Once completed, the needle was removed and disposed of, as per hospital protocols. The area was cleaned, making sure to leave some of the prepping solution back to take advantage of its long term bactericidal properties.  5 cc solution made of 4 cc of 2% lidocaine , 1 cc of methylprednisolone, 40 mg/cc.  Injected into the right knee joint.  Aspiration:  Negative        Vitals:   09/03/23 1355 09/03/23 1400 09/03/23 1405 09/03/23 1414  BP: 108/80 118/82 121/84 125/79  Pulse:      Resp: 18 15 14 16   Temp:      SpO2: 100% 99% 95% 100%  Weight:      Height:        End Time: 1431 hrs    Post-op assessment  Post-procedure Vital Signs:  Pulse/HCG Rate: 7373 Temp: (!) 97.3 F (36.3 C) Resp: 16 BP: 125/79 SpO2: 100  %  EBL: None  Complications: No  immediate post-treatment complications observed by team, or reported by patient.  Note: The patient tolerated the entire procedure well. A repeat set of vitals were taken after the procedure and the patient was kept under observation following institutional policy, for this type of procedure. Post-procedural neurological assessment was performed, showing return to baseline, prior to discharge. The patient was provided with post-procedure discharge instructions, including a section on how to identify potential problems. Should any problems arise concerning this procedure, the patient was given instructions to immediately contact us , at any time, without hesitation. In any case, we plan to contact the patient by telephone for a follow-up status report regarding this interventional procedure.  Comments:  No additional relevant information.  Plan of care   Medications administered: We administered lidocaine , lactated ringers , midazolam , ropivacaine  (PF) 2 mg/mL (0.2%), dexamethasone , and methylPREDNISolone acetate.  Follow-up plan:   Return in about 6 weeks (around 10/15/2023) for PPE, F2F.       Recent Visits Date Type Provider Dept  07/23/23 Office Visit Cephus Collin, MD [facility]  07/02/23 Procedure visit Cephus Collin, MD [facility]  06/11/23 Office Visit Cephus Collin, MD [facility]  Showing recent visits within past 90 days and meeting all other requirements Today's Visits Date Type Provider Dept  09/03/23 Procedure visit Cephus Collin, MD [facility]  Showing today's visits and meeting all other requirements Future Appointments Date Type Provider Dept  10/14/23 Appointment Cephus Collin, MD [facility]  Showing future appointments within next 90 days and meeting all other requirements   Disposition: Discharge home  Discharge (Date  Time): 09/03/2023; 1435 hrs.   Primary Care Physician: No  primary care provider on file. Location: [facility] Note by: Cephus Collin, MD Date: 09/03/2023; Time: 2:43 PM  DISCLAIMER: Medicine is not an Visual merchandiser. It has no guarantees or warranties. The decision to proceed with this intervention was based on the information collected from the patient. Conclusions were drawn from the patient's questionnaire, interview, and examination. Because information was provided in large part by the patient, it cannot be guaranteed that it has not been purposely or unconsciously manipulated or altered. Every effort has been made to obtain as much accurate, relevant, available data as possible. Always take into account that the treatment will also be dependent on availability of resources and existing treatment guidelines, considered by other Pain Management Specialists as being common knowledge and practice, at the time of the intervention. It is also important to point out that variation in procedural techniques and pharmacological choices are the acceptable norm. For Medico-Legal review purposes, the indications, contraindications, technique, and results of the these procedures should only be evaluated, judged and interpreted by a Board-Certified Interventional Pain Specialist with extensive familiarity and expertise in the same exact procedure and technique.

## 2023-09-03 NOTE — Patient Instructions (Signed)

## 2023-09-04 ENCOUNTER — Telehealth: Payer: Self-pay | Admitting: *Deleted

## 2023-09-04 NOTE — Telephone Encounter (Signed)
 Post procedure call;  reports she is doing well.  No other concerns.

## 2023-09-08 ENCOUNTER — Ambulatory Visit: Payer: Medicare Other | Admitting: Dermatology

## 2023-09-09 ENCOUNTER — Telehealth: Payer: Self-pay

## 2023-09-09 NOTE — Telephone Encounter (Signed)
 Patient called office asking for RF of Altreno  prescription to [facility]. Patient has also sent request asking for Tretinoin  0.025%. she would like both of these on hand to use and rotate. Patient feels as if she does not use Altreno  consistently when it starts to dry her out.

## 2023-09-10 MED ORDER — ALTRENO 0.05 % EX LOTN: 1.0000 | TOPICAL_LOTION | CUTANEOUS | 0 refills | Status: DC

## 2023-09-10 MED ORDER — TRETINOIN 0.025 % EX CREA
TOPICAL_CREAM | Freq: Every day | CUTANEOUS | 0 refills | Status: DC
Start: 2023-09-10 — End: 2024-01-07

## 2023-09-10 NOTE — Telephone Encounter (Signed)
 Prescriptions sent to [facility]. aw

## 2023-09-24 ENCOUNTER — Ambulatory Visit: Admitting: Dermatology

## 2023-09-24 DIAGNOSIS — L814 Other melanin hyperpigmentation: Secondary | ICD-10-CM | POA: Diagnosis not present

## 2023-09-24 DIAGNOSIS — W908XXA Exposure to other nonionizing radiation, initial encounter: Secondary | ICD-10-CM | POA: Diagnosis not present

## 2023-09-24 DIAGNOSIS — L570 Actinic keratosis: Secondary | ICD-10-CM

## 2023-09-24 DIAGNOSIS — Z1283 Encounter for screening for malignant neoplasm of skin: Secondary | ICD-10-CM

## 2023-09-24 DIAGNOSIS — D692 Other nonthrombocytopenic purpura: Secondary | ICD-10-CM

## 2023-09-24 DIAGNOSIS — Z7189 Other specified counseling: Secondary | ICD-10-CM

## 2023-09-24 DIAGNOSIS — Z85828 Personal history of other malignant neoplasm of skin: Secondary | ICD-10-CM

## 2023-09-24 DIAGNOSIS — D1801 Hemangioma of skin and subcutaneous tissue: Secondary | ICD-10-CM

## 2023-09-24 DIAGNOSIS — L820 Inflamed seborrheic keratosis: Secondary | ICD-10-CM | POA: Diagnosis not present

## 2023-09-24 DIAGNOSIS — L578 Other skin changes due to chronic exposure to nonionizing radiation: Secondary | ICD-10-CM | POA: Diagnosis not present

## 2023-09-24 DIAGNOSIS — L853 Xerosis cutis: Secondary | ICD-10-CM

## 2023-09-24 DIAGNOSIS — B353 Tinea pedis: Secondary | ICD-10-CM

## 2023-09-24 DIAGNOSIS — L719 Rosacea, unspecified: Secondary | ICD-10-CM

## 2023-09-24 DIAGNOSIS — Z86007 Personal history of in-situ neoplasm of skin: Secondary | ICD-10-CM

## 2023-09-24 DIAGNOSIS — D229 Melanocytic nevi, unspecified: Secondary | ICD-10-CM

## 2023-09-24 DIAGNOSIS — L821 Other seborrheic keratosis: Secondary | ICD-10-CM

## 2023-09-24 MED ORDER — FLUCONAZOLE 200 MG PO TABS: 200.0000 mg | ORAL_TABLET | Freq: Every day | ORAL | 0 refills | Status: DC

## 2023-09-24 MED ORDER — CICLOPIROX 0.77 % EX GEL: CUTANEOUS | 1 refills | Status: DC

## 2023-09-24 NOTE — Patient Instructions (Addendum)
 For tinea pedis: Apply Ciclopirox  apply twice a day to affected areas.    Start Fluconazole 200mg , take daily by mouth for 1 week, then take by mouth once a week until gone.   Recommend OTC Zeasorb AF powder to body folds daily after shower.  It is often found in the athlete's foot section in the pharmacy.  Avoid using powders that contain cornstarch.     Cryotherapy Aftercare  Wash gently with soap and water everyday.   Apply Vaseline and Band-Aid daily until healed.     Recommend daily broad spectrum sunscreen SPF 30+ to sun-exposed areas, reapply every 2 hours as needed. Call for new or changing lesions.  Staying in the shade or wearing long sleeves, sun glasses (UVA+UVB protection) and wide brim hats (4-inch brim around the entire circumference of the hat) are also recommended for sun protection.    Melanoma ABCDEs  Melanoma is the most dangerous type of skin cancer, and is the leading cause of death from skin disease.  You are more likely to develop melanoma if you: Have light-colored skin, light-colored eyes, or red or blond hair Spend a lot of time in the sun Tan regularly, either outdoors or in a tanning bed Have had blistering sunburns, especially during childhood Have a close family member who has had a melanoma Have atypical moles or large birthmarks  Early detection of melanoma is key since treatment is typically straightforward and cure rates are extremely high if we catch it early.   The first sign of melanoma is often a change in a mole or a new dark spot.  The ABCDE system is a way of remembering the signs of melanoma.  A for asymmetry:  The two halves do not match. B for border:  The edges of the growth are irregular. C for color:  A mixture of colors are present instead of an even brown color. D for diameter:  Melanomas are usually (but not always) greater than 6mm - the size of a pencil eraser. E for evolution:  The spot keeps changing in size, shape, and  color.  Please check your skin once per month between visits. You can use a small mirror in front and a large mirror behind you to keep an eye on the back side or your body.   If you see any new or changing lesions before your next follow-up, please call to schedule a visit.  Please continue daily skin protection including broad spectrum sunscreen SPF 30+ to sun-exposed areas, reapplying every 2 hours as needed when you're outdoors.   Staying in the shade or wearing long sleeves, sun glasses (UVA+UVB protection) and wide brim hats (4-inch brim around the entire circumference of the hat) are also recommended for sun protection.     Due to recent changes in healthcare laws, you may see results of your pathology and/or laboratory studies on MyChart before the doctors have had a chance to review them. We understand that in some cases there may be results that are confusing or concerning to you. Please understand that not all results are received at the same time and often the doctors may need to interpret multiple results in order to provide you with the best plan of care or course of treatment. Therefore, we ask that you please give us  2 business days to thoroughly review all your results before contacting the office for clarification. Should we see a critical lab result, you will be contacted sooner.   If You Need Anything  After Your Visit  If you have any questions or concerns for your doctor, please call our main line at [phone] and press option 4 to reach your doctor's medical assistant. If no one answers, please leave a voicemail as directed and we will return your call as soon as possible. Messages left after 4 pm will be answered the following business day.   You may also send us  a message via MyChart. We typically respond to MyChart messages within 1-2 business days.  For prescription refills, please ask your pharmacy to contact our office. Our fax number is [phone].  If you have  an urgent issue when the clinic is closed that cannot wait until the next business day, you can page your doctor at the number below.    Please note that while we do our best to be available for urgent issues outside of office hours, we are not available 24/7.   If you have an urgent issue and are unable to reach us , you may choose to seek medical care at your doctor's office, retail clinic, urgent care center, or emergency room.  If you have a medical emergency, please immediately call 911 or go to the emergency department.  Pager Numbers  - Dr. Bary Likes: [phone]  - Dr. Annette Barters: [phone]  - Dr. Felipe Horton: [phone]   In the event of inclement weather, please call our main line at [phone] for an update on the status of any delays or closures.  Dermatology Medication Tips: Please keep the boxes that topical medications come in in order to help keep track of the instructions about where and how to use these. Pharmacies typically print the medication instructions only on the boxes and not directly on the medication tubes.   If your medication is too expensive, please contact our office at [phone] option 4 or send us  a message through MyChart.   We are unable to tell what your co-pay for medications will be in advance as this is different depending on your insurance coverage. However, we may be able to find a substitute medication at lower cost or fill out paperwork to get insurance to cover a needed medication.   If a prior authorization is required to get your medication covered by your insurance company, please allow us  1-2 business days to complete this process.  Drug prices often vary depending on where the prescription is filled and some pharmacies may offer cheaper prices.  The website www.goodrx.com contains coupons for medications through different pharmacies. The prices here do not account for what the cost may be with help from insurance (it may be cheaper with  your insurance), but the website can give you the price if you did not use any insurance.  - You can print the associated coupon and take it with your prescription to the pharmacy.  - You may also stop by our office during regular business hours and pick up a GoodRx coupon card.  - If you need your prescription sent electronically to a different pharmacy, notify our office through [facility] or by phone at [phone] option 4.     Si Usted Necesita Algo Despus de Su Visita  Tambin puede enviarnos un mensaje a travs de Clinical cytogeneticist. Por lo general respondemos a los mensajes de MyChart en el transcurso de 1 a 2 das hbiles.  Para renovar recetas, por favor pida a su farmacia que se ponga en contacto con nuestra oficina. Franz Jacks de fax es [place] [phone].  Si tiene un asunto  urgente cuando la clnica est cerrada y que no puede esperar hasta el siguiente da hbil, puede llamar/localizar a su doctor(a) al nmero que aparece a continuacin.   Por favor, tenga en cuenta que aunque hacemos todo lo posible para estar disponibles para asuntos urgentes fuera del horario de [place], no estamos disponibles las 24 horas del da, los 7 [address]  [address] de la [place].   Si tiene un problema urgente y no puede comunicarse con nosotros, puede optar por buscar atencin mdica  en el consultorio de su doctor(a), en una clnica privada, en un centro de atencin urgente o en una sala de emergencias.  Si tiene Engineer, drilling, por favor llame inmediatamente al 911 o vaya a la sala de emergencias.  Nmeros de bper  - Dr. Bary Likes: [phone]  - Dra. Annette Barters: [phone]  - Dr. Felipe Horton: [phone]   En caso de inclemencias del tiempo, por favor llame a Lajuan Pila principal al [phone] para una actualizacin sobre el [place] de cualquier retraso o cierre.  Consejos para la medicacin en dermatologa: Por favor, guarde las cajas en las que vienen los medicamentos de uso tpico para  ayudarle a seguir las instrucciones sobre dnde y cmo usarlos. Las farmacias generalmente imprimen las instrucciones del medicamento slo en las cajas y no directamente en los tubos del [place].   Si su medicamento es muy caro, por favor, pngase en contacto con Bettyjane Brunet llamando al [phone] y presione la opcin 4 o envenos un mensaje a travs de Clinical cytogeneticist.   No podemos decirle cul ser su copago por los medicamentos por adelantado ya que esto es diferente dependiendo de la cobertura de su seguro. Sin embargo, es posible que podamos encontrar un medicamento sustituto a Audiological scientist un formulario para que el seguro cubra el medicamento que se considera necesario.   Si se requiere una autorizacin previa para que su compaa de seguros [place] su medicamento, por favor permtanos de 1 a 2 das hbiles para completar este proceso.  Los precios de los medicamentos varan con frecuencia dependiendo del Environmental consultant de dnde se surte la receta y alguna farmacias pueden ofrecer precios ms baratos.  El sitio web www.goodrx.com tiene cupones para medicamentos de Health and safety inspector. Los precios aqu no tienen en cuenta lo que podra costar con la ayuda del seguro (puede ser ms barato con su seguro), pero el sitio web puede darle el precio si no utiliz Tourist information centre manager.  - Puede imprimir el cupn correspondiente y llevarlo con su receta a la farmacia.  - Tambin puede pasar por nuestra oficina durante el horario de atencin regular y Education officer, museum una tarjeta de cupones de GoodRx.  - Si necesita que su receta se enve electrnicamente a una farmacia diferente, informe a nuestra oficina a travs de MyChart de [place] o por telfono llamando al [phone] y presione la opcin 4.

## 2023-09-24 NOTE — Progress Notes (Signed)
 Follow-Up Visit   Subjective  Jessica Haas is a 73 y.o. female who presents for the following: Skin Cancer Screening and Full Body Skin Exam. Patient with hx BCC, SCCIS, and SCC. Patient some places at face and upper chest, some are irritating, itchy and she picks at them.. Patient she had BBL done a few months ago and left some "spots" at face and chest.   The patient presents for Total-Body Skin Exam (TBSE) for skin cancer screening and mole check. The patient has spots, moles and lesions to be evaluated, some may be new or changing and the patient may have concern these could be cancer.   The following portions of the chart were reviewed this encounter and updated as appropriate: medications, allergies, medical history  Review of Systems:  No other skin or systemic complaints except as noted in HPI or Assessment and Plan.  Objective  Well appearing patient in no apparent distress; mood and affect are within normal limits.  A full examination was performed including scalp, head, eyes, ears, nose, lips, neck, chest, axillae, abdomen, back, buttocks, bilateral upper extremities, bilateral lower extremities, hands, feet, fingers, toes, fingernails, and toenails. All findings within normal limits unless otherwise noted below.   Relevant physical exam findings are noted in the Assessment and Plan.  R chest x1, L chest x 1, R heel x1, L nasal ala rim x1, R lat foot x6, R ankle x1, R lower nasal dorsum x 1, L lateral foot x5, L medial foot x1 (18) Pink scaly macules R anterior shoulder x2, R upper back x5, L post thigh x2, R post thigh x1, L abdomen x1, L low pretibia x2, Spinal low back x1, L buttock x2, R buttock x1 (17) Stuck on waxy paps with erythema  Assessment & Plan   SKIN CANCER SCREENING PERFORMED TODAY.  ACTINIC DAMAGE WITH PRECANCEROUS ACTINIC KERATOSES Counseling for Topical Chemotherapy Management: Patient exhibits: - Severe, confluent actinic changes with  pre-cancerous actinic keratoses that is secondary to cumulative UV radiation exposure over time - Condition that is severe; chronic, not at goal. - diffuse scaly erythematous macules and papules with underlying dyspigmentation - Discussed Prescription "Field Treatment" topical Chemotherapy for Severe, Chronic Confluent Actinic Changes with Pre-Cancerous Actinic Keratoses Field treatment involves treatment of an entire area of skin that has confluent Actinic Changes (Sun/ Ultraviolet light damage) and PreCancerous Actinic Keratoses by method of PhotoDynamic Therapy (PDT) and/or prescription Topical Chemotherapy agents such as 5-fluorouracil, 5-fluorouracil/calcipotriene, and/or imiquimod.  The purpose is to decrease the number of clinically evident and subclinical PreCancerous lesions to prevent progression to development of skin cancer by chemically destroying early precancer changes that may or may not be visible.  It has been shown to reduce the risk of developing skin cancer in the treated area. As a result of treatment, redness, scaling, crusting, and open sores may occur during treatment course. One or more than one of these methods may be used and may have to be used several times to control, suppress and eliminate the PreCancerous changes. Discussed treatment course, expected reaction, and possible side effects. - Recommend daily broad spectrum sunscreen SPF 30+ to sun-exposed areas, reapply every 2 hours as needed.  - Staying in the shade or wearing long sleeves, sun glasses (UVA+UVB protection) and wide brim hats (4-inch brim around the entire circumference of the hat) are also recommended. - Call for new or changing lesions. - Recommend red light PDT with debridement to face.  Patient will schedule.   -  Pink scaly macules c/w Aks at face- R medial cheek, R nasal dorsum, nasal tip, chin, mid forehead - Discussed treating with red light before treating with cryotherapy to prevent scarring. Pt has  had scarring with previous cryotherapy on forehead. - Recommend 2 treatments 4-6 weeks apart.   LENTIGINES, SEBORRHEIC KERATOSES, HEMANGIOMAS - Benign normal skin lesions - Benign-appearing - Call for any changes  MELANOCYTIC NEVI - Tan-brown and/or pink-flesh-colored symmetric macules and papules - Benign appearing on exam today - Observation - Call clinic for new or changing moles - Recommend daily use of broad spectrum spf 30+ sunscreen to sun-exposed areas.   HISTORY OF BASAL CELL CARCINOMA OF THE SKIN Back- 90s - No evidence of recurrence today - Recommend regular full body skin exams - Recommend daily broad spectrum sunscreen SPF 30+ to sun-exposed areas, reapply every 2 hours as needed.  - Call if any new or changing lesions are noted between office visits  HISTORY OF SQUAMOUS CELL CARCINOMA IN SITU OF THE SKIN Right calf- 03/19/2016 - No evidence of recurrence today - Recommend regular full body skin exams - Recommend daily broad spectrum sunscreen SPF 30+ to sun-exposed areas, reapply every 2 hours as needed.  - Call if any new or changing lesions are noted between office visits  HISTORY OF SQUAMOUS CELL CARCINOMA OF THE SKIN Multiple see history  - No evidence of recurrence today BL lower legs- calves - Recommend regular full body skin exams - Recommend daily broad spectrum sunscreen SPF 30+ to sun-exposed areas, reapply every 2 hours as needed.  - Call if any new or changing lesions are noted between office visits  ROSACEA Exam: Mild erythema at the cheeks, pink papule.  Chronic and persistent condition with duration or expected duration over one year. Condition is improving with treatment but not currently at goal. Still has redness after BBL treatment.   Rosacea is a chronic progressive skin condition usually affecting the face of adults, causing redness and/or acne bumps. It is treatable but not curable. It sometimes affects the eyes (ocular rosacea) as well.  It may respond to topical and/or systemic medication and can flare with stress, sun exposure, alcohol, exercise, topical steroids (including hydrocortisone/cortisone 10) and some foods.  Daily application of broad spectrum spf 30+ sunscreen to face is recommended to reduce flares.  Patient denies grittiness of the eyes.  Treatment Plan Continue Soolantra  nightly to face   Discussed Rhofade, patient would prefer to continue Soolantra  since her insurance has been covering it.  Can continue Tretinoin  0.025% cream. Hold off while recovering from red light PDT.   Topical retinoid medications like tretinoin /Retin-A , adapalene/Differin, tazarotene/Fabior, and Epiduo/Epiduo Forte can cause dryness and irritation when first started. Only apply a pea-sized amount to the entire affected area. Avoid applying it around the eyes, edges of mouth and creases at the nose. If you experience irritation, use a good moisturizer first and/or apply the medicine less often. If you are doing well with the medicine, you can increase how often you use it until you are applying every night. Be careful with sun protection while using this medication as it can make you sensitive to the sun. This medicine should not be used by pregnant women.    Counseling for BBL / IPL / Laser and Coordination of Care Discussed the treatment option of Broad Band Light (BBL) /Intense Pulsed Light (IPL)/ Laser for skin discoloration, including brown spots and redness.  Typically we recommend at least 1-3 treatment sessions about 5-8 weeks apart  for best results.  Cannot have tanned skin when BBL performed, and regular use of sunscreen/photoprotection is advised after the procedure to help maintain results. The patient's condition may also require "maintenance treatments" in the future.  The fee for BBL / laser treatments is $350 per treatment session for the whole face.  A fee can be quoted for other parts of the body.  Insurance typically does not  pay for BBL/laser treatments and therefore the fee is an out-of-pocket cost. Recommend prophylactic valtrex  treatment. Once scheduled for procedure, will send Rx in prior to patient's appointment.   Purpura - Chronic; persistent and recurrent.  Treatable, but not curable. Pt on Celebrex   - Violaceous macules and patches - Benign - Related to trauma, age, sun damage and/or use of blood thinners, chronic use of topical and/or oral steroids - Observe - Can use OTC arnica containing moisturizer such as Dermend Bruise Formula if desired - Call for worsening or other concerns  Xerosis - diffuse xerotic patches - recommend gentle, hydrating skin care - gentle skin care handout given  TINEA PEDIS Exam: Maceration with deep fissure at R 4th webspace  Treatment Plan: Start Ciclopirox  gel apply BID to aa.   Start Fluconazole 200mg , take daily for 1 week, then take once a week until gone. #30  Side effects of fluconazole (diflucan) include nausea, diarrhea, headache, dizziness, taste changes, rare risk of irritation of the liver, allergy, or decreased blood counts (which could show up as infection or tiredness).   Recommend OTC Zeasorb AF powder to web space daily in AM.  It is often found in the athlete's foot section in the pharmacy.  Avoid using powders that contain cornstarch.   AK (ACTINIC KERATOSIS) (18) R chest x1, L chest x 1, R heel x1, L nasal ala rim x1, R lat foot x6, R ankle x1, R lower nasal dorsum x 1, L lateral foot x5, L medial foot x1 (18) Actinic keratoses are precancerous spots that appear secondary to cumulative UV radiation exposure/sun exposure over time. They are chronic with expected duration over 1 year. A portion of actinic keratoses will progress to squamous cell carcinoma of the skin. It is not possible to reliably predict which spots will progress to skin cancer and so treatment is recommended to prevent development of skin cancer.  Recommend daily broad spectrum  sunscreen SPF 30+ to sun-exposed areas, reapply every 2 hours as needed.  Recommend staying in the shade or wearing long sleeves, sun glasses (UVA+UVB protection) and wide brim hats (4-inch brim around the entire circumference of the hat). Call for new or changing lesions. Destruction of lesion - R chest x1, L chest x 1, R heel x1, L nasal ala rim x1, R lat foot x6, R ankle x1, R lower nasal dorsum x 1, L lateral foot x5, L medial foot x1 (18)  Destruction method: cryotherapy   Informed consent: discussed and consent obtained   Lesion destroyed using liquid nitrogen: Yes   Region frozen until ice ball extended beyond lesion: Yes   Outcome: patient tolerated procedure well with no complications   Post-procedure details: wound care instructions given   INFLAMED SEBORRHEIC KERATOSIS (17) R anterior shoulder x2, R upper back x5, L post thigh x2, R post thigh x1, L abdomen x1, L low pretibia x2, Spinal low back x1, L buttock x2, R buttock x1 (17) Symptomatic, irritating, patient would like treated.  Recommend Amlactin 15% cream to help smooth spots at legs.  Destruction of lesion - R  anterior shoulder x2, R upper back x5, L post thigh x2, R post thigh x1, L abdomen x1, L low pretibia x2, Spinal low back x1, L buttock x2, R buttock x1 (17)  Destruction method: cryotherapy   Informed consent: discussed and consent obtained   Lesion destroyed using liquid nitrogen: Yes   Region frozen until ice ball extended beyond lesion: Yes   Outcome: patient tolerated procedure well with no complications   Post-procedure details: wound care instructions given   Additional details:  Prior to procedure, discussed risks of blister formation, small wound, skin dyspigmentation, or rare scar following cryotherapy. Recommend Vaseline ointment to treated areas while healing.   Return for [place], w/ Nurse,  AK f/u at botox appt, 1 year TBSE, w/ Dr. Annette Barters, HxBCC, HxSCC, HxSCCIS.  I, Jacquelynn V. Grier Leber, CMA, am  acting as scribe for Artemio Larry, MD .   Documentation: I have reviewed the above documentation for accuracy and completeness, and I agree with the above.  Artemio Larry, MD

## 2023-10-14 ENCOUNTER — Ambulatory Visit: Admitting: Student in an Organized Health Care Education/Training Program

## 2023-10-20 ENCOUNTER — Ambulatory Visit: Admitting: Dermatology

## 2023-10-20 DIAGNOSIS — L988 Other specified disorders of the skin and subcutaneous tissue: Secondary | ICD-10-CM

## 2023-10-20 DIAGNOSIS — L570 Actinic keratosis: Secondary | ICD-10-CM

## 2023-10-20 DIAGNOSIS — W908XXA Exposure to other nonionizing radiation, initial encounter: Secondary | ICD-10-CM

## 2023-10-20 NOTE — Progress Notes (Signed)
   Follow-Up Visit   Subjective  Jessica Haas is a 73 y.o. female who presents for the following: Botox for facial elastosis and AK f/u. Patient concerned about right eyebrow being higher than left.   The following portions of the chart were reviewed this encounter and updated as appropriate: medications, allergies, medical history  Review of Systems:  No other skin or systemic complaints except as noted in HPI or Assessment and Plan.  Objective  Well appearing patient in no apparent distress; mood and affect are within normal limits.  A focused examination was performed of the face.  Relevant physical exam findings are noted in the Assessment and Plan.  Injection map photo   Eyebrows at rest   Eyebrows raised   Left Forehead Pink scaly macule.  Assessment & Plan   AK (ACTINIC KERATOSIS) Left Forehead Actinic keratoses are precancerous spots that appear secondary to cumulative UV radiation exposure/sun exposure over time. They are chronic with expected duration over 1 year. A portion of actinic keratoses will progress to squamous cell carcinoma of the skin. It is not possible to reliably predict which spots will progress to skin cancer and so treatment is recommended to prevent development of skin cancer.  Recommend daily broad spectrum sunscreen SPF 30+ to sun-exposed areas, reapply every 2 hours as needed.  Recommend staying in the shade or wearing long sleeves, sun glasses (UVA+UVB protection) and wide brim hats (4-inch brim around the entire circumference of the hat). Call for new or changing lesions. Destruction of lesion - Left Forehead  Destruction method: cryotherapy   Informed consent: discussed and consent obtained   Lesion destroyed using liquid nitrogen: Yes   Region frozen until ice ball extended beyond lesion: Yes   Outcome: patient tolerated procedure well with no complications   Post-procedure details: wound care instructions given   Additional  details:  Prior to procedure, discussed risks of blister formation, small wound, skin dyspigmentation, or rare scar following cryotherapy. Recommend Vaseline ointment to treated areas while healing.  ELASTOSIS OF SKIN   Facial Elastosis  Botox 66.25 units - Forehead 9 units - Frown complex 22.5 units - Botox comma 2.5 units (R), 1.25 units (L) - Crow's feet 10 units x 2 - Upper lip 6 units - DAOs 2.5 units x 2  Location: See attached image  Informed consent: Discussed risks (infection, pain, bleeding, bruising, swelling, allergic reaction, paralysis of nearby muscles, eyelid droop, double vision, neck weakness, difficulty breathing, headache, undesirable cosmetic result, and need for additional treatment) and benefits of the procedure, as well as the alternatives.  Informed consent was obtained.  Preparation: The area was cleansed with alcohol.  Procedure Details:  Botox was injected into the dermis with a 30-gauge needle. Pressure applied to any bleeding. Ice packs offered for swelling.  Lot Number:  [id number] Expiration:  08/2025  Total Units Injected:  66.25 units (billed for 66 units)  Plan: Tylenol  may be used for headache.  Allow 2 weeks before returning to clinic for additional dosing as needed. Patient will call for any problems.  Return as scheduled, for Botox recheck- add in brow lift at that time pending results of today's treatment.  IAndrea Kerns, CMA, am acting as scribe for Rexene Rattler, MD .   Documentation: I have reviewed the above documentation for accuracy and completeness, and I agree with the above.  Rexene Rattler, MD

## 2023-10-20 NOTE — Patient Instructions (Signed)

## 2023-10-21 ENCOUNTER — Encounter: Payer: Self-pay | Admitting: Student in an Organized Health Care Education/Training Program

## 2023-10-21 ENCOUNTER — Ambulatory Visit
Attending: Student in an Organized Health Care Education/Training Program | Admitting: Student in an Organized Health Care Education/Training Program

## 2023-10-21 VITALS — BP 132/84 | HR 83 | Temp 97.7°F | Resp 16 | Ht 65.0 in | Wt 139.0 lb

## 2023-10-21 DIAGNOSIS — M180 Bilateral primary osteoarthritis of first carpometacarpal joints: Secondary | ICD-10-CM | POA: Diagnosis not present

## 2023-10-21 DIAGNOSIS — M47816 Spondylosis without myelopathy or radiculopathy, lumbar region: Secondary | ICD-10-CM | POA: Insufficient documentation

## 2023-10-21 NOTE — Progress Notes (Signed)
 Safety precautions to be maintained throughout the outpatient stay will include: orient to surroundings, keep bed in low position, maintain call bell within reach at all times, provide assistance with transfer out of bed and ambulation.

## 2023-10-21 NOTE — Progress Notes (Signed)
 PROVIDER NOTE: Interpretation of information contained herein should be left to medically-trained personnel. Specific patient instructions are provided elsewhere under Patient Instructions section of medical record. This document was created in part using AI and STT-dictation technology, any transcriptional errors that may result from this process are unintentional.  Patient: Jessica Haas  Service: E/M   PCP: No primary care provider on file.  DOB: 05/11/1950  DOS: 10/21/2023  Provider: Wallie Sherry, MD  MRN: [id number]  Delivery: Face-to-face  Specialty: Interventional Pain Management  Type: Established Patient  Setting: Ambulatory outpatient facility  Specialty designation: 09  Referring Prov.: No ref. provider found  Location: Outpatient office facility       History of present illness (HPI) Jessica Haas, a 73 y.o. year old female, is here today because of her Arthritis of carpometacarpal [facility]) joint of both thumbs [M18.0]. Jessica Haas primary complain today is Back Pain (Lumbar bilateral )   Pain Assessment: Severity of Chronic pain is reported as a 6 /10. Location: Back Lower, Left, Right/into left leg lower shin. Onset: More than a month ago. Quality: Discomfort. Timing: Intermittent. Modifying factor(s): procedure helped for the first few days.. Vitals:  height is 5' 5 (1.651 m) and weight is 139 lb (63 kg). Her temporal temperature is 97.7 F (36.5 C). Her blood pressure is 132/84 and her pulse is 83. Her respiration is 16 and oxygen  saturation is 98%.  BMI: Estimated body mass index is 23.13 kg/m as calculated from the following:   Height as of this encounter: 5' 5 (1.651 m).   Weight as of this encounter: 139 lb (63 kg).  Last encounter: 07/23/2023. Last procedure: 09/03/2023.  Reason for encounter:  Post-Procedure Evaluation   Type:  Steroid Intra-articular Knee Injection #1  Laterality: Right (-RT) Level/approach: Medial Imaging guidance: None required  ([id number]) Anesthesia: Local anesthesia (1-2% Lidocaine ) Sedation: Minimal Sedation                       DOS: 09/03/2023  Performed by: Wallie Sherry, MD  Purpose: Diagnostic/Therapeutic Indications: Knee arthralgia associated to osteoarthritis of the knee  Procedure #2: Bilateral L3, L4, L5 RFA    Effectiveness:  Initial hour after procedure: 100 % (bilateral lumbar facet = 100)  Subsequent 4-6 hours post-procedure: 100 % (100)  Analgesia past initial 6 hours: 90 % (100 %, much better x 2 - 3 days, wakes up feeling good and as she goes through the day back becomes painful)  Ongoing improvement:  Analgesic:  30-40% Function: Jessica Haas reports improvement in function    ROS  Constitutional: Denies any fever or chills Gastrointestinal: No reported hemesis, hematochezia, vomiting, or acute GI distress Musculoskeletal: Bilateral CMC pain Neurological: No reported episodes of acute onset apraxia, aphasia, dysarthria, agnosia, amnesia, paralysis, loss of coordination, or loss of consciousness  Medication Review  ALPRAZolam , Ciclopirox , FLUoxetine , Ivermectin , Pitavastatin Calcium, Tretinoin , acetaminophen , celecoxib , ciclopirox , clobetasol  ointment, estradiol, fluconazole , gabapentin, hydrochlorothiazide, mupirocin  ointment, tiZANidine , traZODone, tretinoin , and zolpidem   History Review  Allergy: Jessica Haas is allergic to doxycycline, erythromycin, lidocaine , losartan, rosuvastatin, tetracyclines & related, and thimerosal (thiomersal). Drug: Jessica Haas  reports no history of drug use. Alcohol:  reports no history of alcohol use. Tobacco:  reports that she quit smoking about 45 years ago. Her smoking use included cigarettes. She has quit using smokeless tobacco. Social: Jessica Haas  reports that she quit smoking about 45 years ago. Her smoking use included cigarettes. She has quit using smokeless tobacco. She reports  that she does not drink alcohol and does not use  drugs. Medical:  has a past medical history of Actinic keratosis (11/29/2014), Anemia (2008), Anxiety, Arthritis, Basal cell carcinoma of hand, Depression, GERD (gastroesophageal reflux disease), H/O abnormal mammogram (2008), H/O gastric ulcer (2008), High altitude sickness (2008), History of hiatal hernia, Neuromuscular disorder (HCC), Skin cancer (03/19/2016), Skin cancer (09/15/2018), Squamous cell carcinoma of skin (03/19/2016), Squamous cell carcinoma of skin (09/15/2018), Squamous cell carcinoma of skin (04/03/2021), Swelling, and Varicose vein of leg. Surgical: Jessica Haas  has a past surgical history that includes Abdominal hysterectomy; Carpal tunnel release; Foot Fusion (Left); Cataract extraction w/PHACO (Left, 12/19/2015); Cataract extraction w/PHACO (Right, 02/13/2016); Cervical fusion; and Bone excision (Right, 06/04/2018). Family: family history includes COPD in her father; COPD (age of onset: 75) in her maternal grandmother; Cancer (age of onset: 75) in her mother; Heart disease (age of onset: 75) in her father.  Laboratory Chemistry Profile   Renal Lab Results  Component Value Date   BUN 19 04/27/2012   CREATININE 0.9 04/30/2018   GFR 79.26 04/27/2012    Hepatic Lab Results  Component Value Date   AST 29 04/30/2018   ALT 16 04/30/2018   ALBUMIN 4.1 04/27/2012   ALKPHOS 46 04/30/2018    Electrolytes Lab Results  Component Value Date   NA 138 04/30/2018   K 4.1 04/30/2018   CL 105 04/27/2012   CALCIUM 9.1 04/27/2012    Bone Lab Results  Component Value Date   VD25OH 29 04/30/2018    Inflammation (CRP: Acute Phase) (ESR: Chronic Phase) No results found for: CRP, ESRSEDRATE, LATICACIDVEN       Note: Above Lab results reviewed.  Recent Imaging Review  DG PAIN CLINIC C-ARM 1-60 MIN NO REPORT Fluoro was used, but no Radiologist interpretation will be provided.  Please refer to NOTES tab for provider progress note. Note: Reviewed        Physical Exam   Vitals: BP 132/84 (BP Location: Right Arm, Patient Position: Sitting, Cuff Size: Normal)   Pulse 83   Temp 97.7 F (36.5 C) (Temporal)   Resp 16   Ht 5' 5 (1.651 m)   Wt 139 lb (63 kg)   SpO2 98%   BMI 23.13 kg/m  BMI: Estimated body mass index is 23.13 kg/m as calculated from the following:   Height as of this encounter: 5' 5 (1.651 m).   Weight as of this encounter: 139 lb (63 kg). Ideal: Ideal body weight: 57 kg (125 lb 10.6 oz) Adjusted ideal body weight: 59.4 kg (131 lb) General appearance: Well nourished, well developed, and well hydrated. In no apparent acute distress Mental status: Alert, oriented x 3 (person, place, & time)       Respiratory: No evidence of acute respiratory distress Eyes: PERLA  Bilateral thumb pain Assessment   Diagnosis Status  1. Arthritis of carpometacarpal (CMC) joint of both thumbs   2. Lumbar facet arthropathy   3. Lumbar spondylosis    Having a Flare-up Controlled Controlled   Updated Problems: No problems updated.  Plan of Care  1. Arthritis of carpometacarpal (CMC) joint of both thumbs (Primary) - Small Joint Injection/Arthrocentesis; Future  2. Lumbar facet arthropathy  3. Lumbar spondylosis Repeat lumbar RFA as needed, consider Sprint PNS   Orders:  Orders Placed This Encounter  Procedures   Small Joint Injection/Arthrocentesis    Standing Status:   Future    Expected Date:   10/29/2023    Expiration Date:   10/20/2024  Scheduling Instructions:     B/L CMC thumb injection NS     BLF L3,4,5 05/26/23, #2 07/02/23; B/L L3,4,5 RFA 09/03/23     Return in about 8 days (around 10/29/2023) for B/L CMC thumb injection NS.    Recent Visits Date Type Provider Dept  09/03/23 Procedure visit Marcelino Nurse, MD [facility]  07/23/23 Office Visit Marcelino Nurse, MD [facility]  Showing recent visits within past 90 days and meeting all other requirements Today's Visits Date Type Provider Dept  10/21/23 Office  Visit Marcelino Nurse, MD [facility]  Showing today's visits and meeting all other requirements Future Appointments Date Type Provider Dept  10/29/23 Appointment Marcelino Nurse, MD [facility]  Showing future appointments within next 90 days and meeting all other requirements  I discussed the assessment and treatment plan with the patient. The patient was provided an opportunity to ask questions and all were answered. The patient agreed with the plan and demonstrated an understanding of the instructions.  Patient advised to call back or seek an in-person evaluation if the symptoms or condition worsens.  Duration of encounter: 20 minutes.  Total time on encounter, as per AMA guidelines included both the face-to-face and non-face-to-face time personally spent by the physician and/or other qualified health care professional(s) on the day of the encounter (includes time in activities that require the physician or other qualified health care professional and does not include time in activities normally performed by clinical staff). Physician's time may include the following activities when performed: Preparing to see the patient (e.g., pre-charting review of records, searching for previously ordered imaging, lab work, and nerve conduction tests) Review of prior analgesic pharmacotherapies. Reviewing PMP Interpreting ordered tests (e.g., lab work, imaging, nerve conduction tests) Performing post-procedure evaluations, including interpretation of diagnostic procedures Obtaining and/or reviewing separately obtained history Performing a medically appropriate examination and/or evaluation Counseling and educating the patient/family/caregiver Ordering medications, tests, or procedures Referring and communicating with other health care professionals (when not separately reported) Documenting clinical information in the electronic or other health record Independently interpreting results  (not separately reported) and communicating results to the patient/ family/caregiver Care coordination (not separately reported)  Note by: Nurse Marcelino, MD (TTS and AI technology used. I apologize for any typographical errors that were not detected and corrected.) Date: 10/21/2023; Time: 2:53 PM

## 2023-10-28 ENCOUNTER — Encounter

## 2023-10-28 ENCOUNTER — Ambulatory Visit

## 2023-10-29 ENCOUNTER — Ambulatory Visit
Attending: Student in an Organized Health Care Education/Training Program | Admitting: Student in an Organized Health Care Education/Training Program

## 2023-10-29 ENCOUNTER — Encounter: Payer: Self-pay | Admitting: Student in an Organized Health Care Education/Training Program

## 2023-10-29 VITALS — BP 113/75 | HR 93 | Temp 97.2°F | Ht 64.0 in | Wt 139.0 lb

## 2023-10-29 DIAGNOSIS — M180 Bilateral primary osteoarthritis of first carpometacarpal joints: Secondary | ICD-10-CM | POA: Insufficient documentation

## 2023-10-29 MED ORDER — LIDOCAINE HCL 2 % IJ SOLN
20.0000 mL | Freq: Once | INTRAMUSCULAR | Status: AC
  Administered 2023-10-29: 200 mg
  Filled 2023-10-29: qty 40

## 2023-10-29 MED ORDER — DEXAMETHASONE SODIUM PHOSPHATE 10 MG/ML IJ SOLN
20.0000 mg | Freq: Once | INTRAMUSCULAR | Status: AC
  Administered 2023-10-29: 20 mg
  Filled 2023-10-29: qty 2

## 2023-10-29 NOTE — Patient Instructions (Signed)

## 2023-10-29 NOTE — Progress Notes (Signed)
 PROVIDER NOTE: Interpretation of information contained herein should be left to medically-trained personnel. Specific patient instructions are provided elsewhere under Patient Instructions section of medical record. This document was created in part using STT-dictation technology, any transcriptional errors that may result from this process are unintentional.  Patient: Jessica Haas Type: Established DOB: Jan 25, 1951 MRN: [id number] PCP: Anitra Arlean FALCON, MD  Service: Procedure DOS: 10/29/2023 Setting: Ambulatory Location: Ambulatory outpatient facility Delivery: Face-to-face Provider: Wallie Sherry, MD Specialty: Interventional Pain Management Specialty designation: 09 Location: Outpatient facility Ref. Prov.: No ref. provider found       Interventional Therapy   Primary Reason for Visit: Interventional Pain Management Treatment. CC: Other (Thumb/)    Procedure:          Anesthesia, Analgesia, Anxiolysis:  Type: Diagnostic CMC (Carpometacarpal) Steroid Injection           Region: Palmar aspect Interspace between [facility] and Trapezium Level: Wrist Laterality: Bilateral  Type: Local Anesthesia Local Anesthetic: Lidocaine  1-2% Sedation: None  Indication(s):  Analgesia Route: Infiltration ([place]/IM) IV Access: N/A   Position: Sitting   1. Arthritis of carpometacarpal (CMC) joint of both thumbs    NAS-11 Pain score:   Pre-procedure: 7 /10   Post-procedure: 0-No pain/10     H&P (Pre-op Assessment):  Jessica Haas is a 73 y.o. (year old), female patient, seen today for interventional treatment. She  has a past surgical history that includes Abdominal hysterectomy; Carpal tunnel release; Foot Fusion (Left); Cataract extraction w/PHACO (Left, 12/19/2015); Cataract extraction w/PHACO (Right, 02/13/2016); Cervical fusion; and Bone excision (Right, 06/04/2018). Jessica Haas has a current medication list which includes the following prescription(s): acetaminophen , alprazolam , celecoxib , ciclopirox ,  ciclopirox , clobetasol  ointment, estradiol, fluconazole , fluoxetine , gabapentin, hydrochlorothiazide, ivermectin , mupirocin  ointment, pitavastatin calcium, tizanidine , trazodone, altreno , tretinoin , and zolpidem . Her primarily concern today is the Other (Thumb/)  Initial Vital Signs:  Pulse/HCG Rate: 93  Temp: (!) 97.2 F (36.2 C) Resp:   BP: 113/75 SpO2: 96 %  BMI: Estimated body mass index is 23.86 kg/m as calculated from the following:   Height as of this encounter: 5' 4 (1.626 m).   Weight as of this encounter: 139 lb (63 kg).  Risk Assessment: Allergies: Reviewed. She is allergic to doxycycline, erythromycin, lidocaine , losartan, rosuvastatin, tetracyclines & related, and thimerosal (thiomersal).  Allergy Precautions: None required Coagulopathies: Reviewed. None identified.  Blood-thinner therapy: None at this time Active Infection(s): Reviewed. None identified. [name] is afebrile  Site Confirmation: Jessica Haas was asked to confirm the procedure and laterality before marking the site Procedure checklist: Completed Consent: Before the procedure and under the influence of no sedative(s), amnesic(s), or anxiolytics, the patient was informed of the treatment options, risks and possible complications. To fulfill our ethical and legal obligations, as recommended by the American Medical Association's Code of Ethics, I have informed the patient of my clinical impression; the nature and purpose of the treatment or procedure; the risks, benefits, and possible complications of the intervention; the alternatives, including doing nothing; the risk(s) and benefit(s) of the alternative treatment(s) or procedure(s); and the risk(s) and benefit(s) of doing nothing. The patient was provided information about the general risks and possible complications associated with the procedure. These may include, but are not limited to: failure to achieve desired goals, infection, bleeding, organ or nerve  damage, allergic reactions, paralysis, and death. In addition, the patient was informed of those risks and complications associated to the procedure, such as failure to decrease pain; infection; bleeding; organ or nerve damage with subsequent  damage to sensory, motor, and/or autonomic systems, resulting in permanent pain, numbness, and/or weakness of one or several areas of the body; allergic reactions; (i.e.: anaphylactic reaction); and/or death. Furthermore, the patient was informed of those risks and complications associated with the medications. These include, but are not limited to: allergic reactions (i.e.: anaphylactic or anaphylactoid reaction(s)); adrenal axis suppression; blood sugar elevation that in diabetics may result in ketoacidosis or comma; water retention that in patients with history of congestive heart failure may result in shortness of breath, pulmonary edema, and decompensation with resultant heart failure; weight gain; swelling or edema; medication-induced neural toxicity; particulate matter embolism and blood vessel occlusion with resultant organ, and/or nervous system infarction; and/or aseptic necrosis of one or more joints. Finally, the patient was informed that Medicine is not an exact science; therefore, there is also the possibility of unforeseen or unpredictable risks and/or possible complications that may result in a catastrophic outcome. The patient indicated having understood very clearly. We have given the patient no guarantees and we have made no promises. Enough time was given to the patient to ask questions, all of which were answered to the patient's satisfaction. Jessica Haas has indicated that she wanted to continue with the procedure. Attestation: I, the ordering provider, attest that I have discussed with the patient the benefits, risks, side-effects, alternatives, likelihood of achieving goals, and potential problems during recovery for the procedure that I have  provided informed consent. Date  Time: 10/29/2023  1:15 PM  Pre-Procedure Preparation:  Monitoring: As per clinic protocol. Respiration, ETCO2, SpO2, BP, heart rate and rhythm monitor placed and checked for adequate function Safety Precautions: Patient was assessed for positional comfort and pressure points before starting the procedure. Time-out: I initiated and conducted the Time-out before starting the procedure, as per protocol. The patient was asked to participate by confirming the accuracy of the Time Out information. Verification of the correct person, site, and procedure were performed and confirmed by me, the nursing staff, and the patient. Time-out conducted as per Joint Commission's Universal Protocol (UP.01.01.01). Time: 1339 Start Time: 1339 hrs.  Description of Procedure:          Target Area: Space between [facility] and Trapezium Approach: Palmar approach. Area Prepped: Entire wrist Region ChloraPrep (2% chlorhexidine gluconate and 70% isopropyl alcohol) Safety Precautions: Aspiration looking for blood return was conducted prior to all injections. At no point did we inject any substances, as a needle was being advanced. No attempts were made at seeking any paresthesias. Safe injection practices and needle disposal techniques used. Medications properly checked for expiration dates. SDV (single dose vial) medications used. Description of the Procedure: Protocol guidelines were followed. The patient was placed in position. The target area was identified and the area prepped in the usual manner. Skin & deeper tissues infiltrated with local anesthetic. Appropriate amount of time allowed to pass for local anesthetics to take effect. The procedure needles were then advanced to the target area. Proper needle placement secured. Negative aspiration confirmed. Solution injected in intermittent fashion, asking for systemic symptoms every 0.5cc of injectate. The needles were then removed and the  area cleansed, making sure to leave some of the prepping solution back to take advantage of its long term bactericidal properties.  Vitals:   10/29/23 1321  BP: 113/75  Pulse: 93  Temp: (!) 97.2 F (36.2 C)  SpO2: 96%  Weight: 139 lb (63 kg)  Height: 5' 4 (1.626 m)    Start Time: 1339 hrs. End Time: 1343  hrs. Materials:  Needle(s) Type: Regular needle Gauge: 25G Length: 1.5-in 4 cc solution made of 2 cc of 2% lidocaine ,  2 cc of Decadron  10 mg/cc.  2 cc injected for the left CMC joint, 2 cc injected for the right [facility] joint  Antibiotic Prophylaxis:   Anti-infectives (From admission, onward)    None      Indication(s): None identified  Post-operative Assessment:  Post-procedure Vital Signs:  Pulse/HCG Rate: 93  Temp: (!) 97.2 F (36.2 C) Resp:   BP: 113/75 SpO2: 96 %  EBL: None  Complications: No immediate post-treatment complications observed by team, or reported by patient.  Note: The patient tolerated the entire procedure well. A repeat set of vitals were taken after the procedure and the patient was kept under observation following institutional policy, for this type of procedure. Post-procedural neurological assessment was performed, showing return to baseline, prior to discharge. The patient was provided with post-procedure discharge instructions, including a section on how to identify potential problems. Should any problems arise concerning this procedure, the patient was given instructions to immediately contact us , at any time, without hesitation. In any case, we plan to contact the patient by telephone for a follow-up status report regarding this interventional procedure.  Comments:  No additional relevant information.  Plan of Care (POC)   Medications ordered for procedure: Meds ordered this encounter  Medications   lidocaine  (XYLOCAINE ) 2 % (with pres) injection 400 mg   dexamethasone  (DECADRON ) injection 20 mg   Medications administered: We  administered lidocaine  and dexamethasone .  See the medical record for exact dosing, route, and time of administration.    BLF L3,4,5 05/26/23, #2 07/02/23; B/L L3,4,5 RFA 09/03/23       Follow-up plan:   Return in about 3 months (around 01/29/2024), or repeat CMC injection (bilateral thumbs).     Recent Visits Date Type Provider Dept  10/21/23 Office Visit Marcelino Nurse, MD [facility]  09/03/23 Procedure visit Marcelino Nurse, MD [facility]  Showing recent visits within past 90 days and meeting all other requirements Today's Visits Date Type Provider Dept  10/29/23 Procedure visit Marcelino Nurse, MD [facility]  Showing today's visits and meeting all other requirements Future Appointments No visits were found meeting these conditions. Showing future appointments within next 90 days and meeting all other requirements   Disposition: Discharge home  Discharge (Date  Time): 10/29/2023; 1347 hrs.   Primary Care Physician: Anitra Arlean FALCON, MD Location: [facility] Note by: Nurse Marcelino, MD (TTS technology used. I apologize for any typographical errors that were not detected and corrected.) Date: 10/29/2023; Time: 2:38 PM  Disclaimer:  Medicine is not an Visual merchandiser. The only guarantee in medicine is that nothing is guaranteed. It is important to note that the decision to proceed with this intervention was based on the information collected from the patient. The Data and conclusions were drawn from the patient's questionnaire, the interview, and the physical examination. Because the information was provided in large part by the patient, it cannot be guaranteed that it has not been purposely or unconsciously manipulated. Every effort has been made to obtain as much relevant data as possible for this evaluation. It is important to note that the conclusions that lead to this procedure are derived in large part from the available data.  Always take into account that the treatment will also be dependent on availability of resources and existing treatment guidelines, considered by other Pain Management Practitioners as  being common knowledge and practice, at the time of the intervention. For Medico-Legal purposes, it is also important to point out that variation in procedural techniques and pharmacological choices are the acceptable norm. The indications, contraindications, technique, and results of the above procedure should only be interpreted and judged by a Board-Certified Interventional Pain Specialist with extensive familiarity and expertise in the same exact procedure and technique.

## 2023-10-29 NOTE — Progress Notes (Signed)
 Safety precautions to be maintained throughout the outpatient stay will include: orient to surroundings, keep bed in low position, maintain call bell within reach at all times, provide assistance with transfer out of bed and ambulation.

## 2023-10-30 ENCOUNTER — Telehealth: Payer: Self-pay | Admitting: *Deleted

## 2023-10-30 NOTE — Telephone Encounter (Signed)
 No problems post procedure.

## 2023-11-03 ENCOUNTER — Ambulatory Visit (INDEPENDENT_AMBULATORY_CARE_PROVIDER_SITE_OTHER): Admitting: Dermatology

## 2023-11-03 DIAGNOSIS — L988 Other specified disorders of the skin and subcutaneous tissue: Secondary | ICD-10-CM

## 2023-11-03 NOTE — Patient Instructions (Signed)

## 2023-11-03 NOTE — Progress Notes (Signed)
   Follow-Up Visit   Subjective  Jessica Haas is a 73 y.o. female who presents for the following: Botox for facial elastosis  The following portions of the chart were reviewed this encounter and updated as appropriate: medications, allergies, medical history  Review of Systems:  No other skin or systemic complaints except as noted in HPI or Assessment and Plan.  Objective  Well appearing patient in no apparent distress; mood and affect are within normal limits.  A focused examination was performed of the face.  Relevant physical exam findings are noted in the Assessment and Plan.  Injection map photo     Assessment & Plan    Facial Elastosis Eyebrows are more even today after increasing comma injection on right side. Botox 7.5 units for brow lift.  1.25 units 1 cm above brow at mid pupillary line, 2.5 units at inferior lateral brow  Location: See attached image  Informed consent: Discussed risks (infection, pain, bleeding, bruising, swelling, allergic reaction, paralysis of nearby muscles, eyelid droop, double vision, neck weakness, difficulty breathing, headache, undesirable cosmetic result, and need for additional treatment) and benefits of the procedure, as well as the alternatives.  Informed consent was obtained.  Preparation: The area was cleansed with alcohol.  Procedure Details:  Botox was injected into the dermis with a 30-gauge needle. Pressure applied to any bleeding. Ice packs offered for swelling.  Lot Number:  [id number] Expiration:  08/2025  Total Units Injected:  7.5  Plan: Tylenol  may be used for headache.  Allow 2 weeks before returning to clinic for additional dosing as needed. Patient will call for any problems.  Return as scheduled.  IAndrea Kerns, CMA, am acting as scribe for Rexene Rattler, MD .   Documentation: I have reviewed the above documentation for accuracy and completeness, and I agree with the above.  Rexene Rattler, MD

## 2023-11-10 ENCOUNTER — Ambulatory Visit: Payer: Medicare Other | Admitting: Dermatology

## 2023-11-17 ENCOUNTER — Other Ambulatory Visit: Payer: Self-pay | Admitting: Dermatology

## 2023-11-25 ENCOUNTER — Encounter: Payer: Medicare Other | Admitting: Dermatology

## 2023-12-01 ENCOUNTER — Encounter

## 2023-12-01 ENCOUNTER — Ambulatory Visit: Admitting: Dermatology

## 2023-12-01 DIAGNOSIS — L570 Actinic keratosis: Secondary | ICD-10-CM

## 2023-12-01 MED ORDER — AMINOLEVULINIC ACID HCL 10 % EX GEL
2000.0000 mg | Freq: Once | CUTANEOUS | Status: AC
  Administered 2023-12-01 (×2): 2000 mg via TOPICAL

## 2023-12-01 NOTE — Progress Notes (Signed)
 Patient completed red light phototherapy with debridement today to face.  ACTINIC KERATOSES Exam: Erythematous thin papules/macules with gritty scale.  Treatment Plan:  Red Light Photodynamic therapy  Procedure discussed: discussed risks, benefits, side effects. and alternatives   Prep: site scrubbed/prepped with acetone   Debridement needed: Yes (performed by Physician with sand paper.  (CPT [id number])) Location:  face Number of lesions:  Multiple (> 15) Type of treatment:  Red light Aminolevulinic Acid (see MAR for details): Ameluz Aminolevulinic Acid comment:  [id number] Amount of Ameluz (mg):  1 Incubation time (minutes):  60 Number of minutes under lamp:  20 Cooling:  Fan Outcome: patient tolerated procedure well with no complications   Post-procedure details: sunscreen applied and aftercare instructions given to patient    Related Medications Aminolevulinic Acid HCl 10 % GEL 2,000 mg  Amanda White, RMA  I personally debrided area prior to application of aminolevulinic acid, Artemio Larry MD  Documentation: I have reviewed the above documentation for accuracy and completeness, and I agree with the above.  Artemio Larry, MD

## 2023-12-01 NOTE — Patient Instructions (Signed)

## 2024-01-05 ENCOUNTER — Encounter

## 2024-01-05 ENCOUNTER — Ambulatory Visit

## 2024-01-07 ENCOUNTER — Other Ambulatory Visit: Payer: Self-pay | Admitting: Dermatology

## 2024-01-26 ENCOUNTER — Ambulatory Visit: Admitting: Dermatology

## 2024-02-02 ENCOUNTER — Encounter: Payer: Self-pay | Admitting: Student in an Organized Health Care Education/Training Program

## 2024-02-02 ENCOUNTER — Ambulatory Visit
Attending: Student in an Organized Health Care Education/Training Program | Admitting: Student in an Organized Health Care Education/Training Program

## 2024-02-02 VITALS — BP 99/63 | HR 95 | Temp 97.3°F | Resp 18 | Ht 65.0 in | Wt 139.0 lb

## 2024-02-02 DIAGNOSIS — M180 Bilateral primary osteoarthritis of first carpometacarpal joints: Secondary | ICD-10-CM | POA: Diagnosis present

## 2024-02-02 DIAGNOSIS — M1711 Unilateral primary osteoarthritis, right knee: Secondary | ICD-10-CM | POA: Diagnosis present

## 2024-02-02 DIAGNOSIS — M47816 Spondylosis without myelopathy or radiculopathy, lumbar region: Secondary | ICD-10-CM | POA: Insufficient documentation

## 2024-02-02 MED ORDER — METHYLPREDNISOLONE ACETATE 40 MG/ML IJ SUSP
INTRAMUSCULAR | Status: AC
  Filled 2024-02-02: qty 1

## 2024-02-02 MED ORDER — DEXAMETHASONE SOD PHOSPHATE PF 10 MG/ML IJ SOLN
20.0000 mg | Freq: Once | INTRAMUSCULAR | Status: AC
  Administered 2024-02-02: 20 mg

## 2024-02-02 MED ORDER — METHYLPREDNISOLONE ACETATE 40 MG/ML IJ SUSP
40.0000 mg | Freq: Once | INTRAMUSCULAR | Status: AC
  Administered 2024-02-02: 40 mg via INTRA_ARTICULAR

## 2024-02-02 MED ORDER — ROPIVACAINE HCL 2 MG/ML IJ SOLN
INTRAMUSCULAR | Status: AC
  Filled 2024-02-02: qty 20

## 2024-02-02 MED ORDER — LIDOCAINE HCL 2 % IJ SOLN
20.0000 mL | Freq: Once | INTRAMUSCULAR | Status: AC
  Administered 2024-02-02: 400 mg
  Filled 2024-02-02: qty 20

## 2024-02-02 NOTE — Progress Notes (Signed)
 Safety precautions to be maintained throughout the outpatient stay will include: orient to surroundings, keep bed in low position, maintain call bell within reach at all times, provide assistance with transfer out of bed and ambulation.

## 2024-02-02 NOTE — Progress Notes (Signed)
 PROVIDER NOTE: Interpretation of information contained herein should be left to medically-trained personnel. Specific patient instructions are provided elsewhere under Patient Instructions section of medical record. This document was created in part using STT-dictation technology, any transcriptional errors that may result from this process are unintentional.  Patient: Jessica Haas Type: Established DOB: 01/31/1951 MRN: [id number] PCP: Anitra Arlean FALCON, MD  Service: Procedure DOS: 02/02/2024 Setting: Ambulatory Location: Ambulatory outpatient facility Delivery: Face-to-face Provider: Wallie Sherry, MD Specialty: Interventional Pain Management Specialty designation: 09 Location: Outpatient facility Ref. Prov.: Anitra Arlean FALCON, MD       Interventional Therapy   Primary Reason for Visit: Interventional Pain Management Treatment. CC: Pain (Bilateral thumbs) and Knee Pain (right)    Procedure:          Anesthesia, Analgesia, Anxiolysis:  Type: Diagnostic CMC (Carpometacarpal) Steroid Injection           Region: Palmar aspect Interspace between [facility] and Trapezium #2 Level: Wrist Laterality: Bilateral  Type: Local Anesthesia Local Anesthetic: Lidocaine  1-2% Sedation: None  Indication(s):  Analgesia Route: Infiltration ([place]/IM) IV Access: N/A   Position: Sitting   1. Arthritis of carpometacarpal (CMC) joint of both thumbs   2. Primary osteoarthritis of right knee   3. Lumbar facet arthropathy   4. Lumbar spondylosis    NAS-11 Pain score:   Pre-procedure: 8 /10   Post-procedure: 8 /10     H&P (Pre-op Assessment):  Jessica Haas is a 73 y.o. (year old), female patient, seen today for interventional treatment. She  has a past surgical history that includes Abdominal hysterectomy; Carpal tunnel release; Foot Fusion (Left); Cataract extraction w/PHACO (Left, 12/19/2015); Cataract extraction w/PHACO (Right, 02/13/2016); Cervical fusion; and Bone excision (Right, 06/04/2018). Jessica Haas  has a current medication list which includes the following prescription(s): acetaminophen , alprazolam , celecoxib , ciclopirox , estradiol, fluconazole , hydrochlorothiazide, mupirocin  ointment, pitavastatin calcium, tizanidine , trazodone, altreno , tretinoin , zolpidem , fluoxetine , and soolantra , and the following Facility-Administered Medications: dexamethasone , lidocaine , and methylprednisolone  acetate. Her primarily concern today is the Pain (Bilateral thumbs) and Knee Pain (right)  Initial Vital Signs:  Pulse/HCG Rate: 95  Temp: (!) 97.3 F (36.3 C) Resp: 18 BP: 99/63 SpO2: 100 %  BMI: Estimated body mass index is 23.13 kg/m as calculated from the following:   Height as of this encounter: 5' 5 (1.651 m).   Weight as of this encounter: 139 lb (63 kg).  Risk Assessment: Allergies: Reviewed. She is allergic to doxycycline, erythromycin, lidocaine , losartan, rosuvastatin, tetracyclines & related, and thimerosal (thiomersal).  Allergy Precautions: None required Coagulopathies: Reviewed. None identified.  Blood-thinner therapy: None at this time Active Infection(s): Reviewed. None identified. Jessica Haas is afebrile  Site Confirmation: Jessica Haas was asked to confirm the procedure and laterality before marking the site Procedure checklist: Completed Consent: Before the procedure and under the influence of no sedative(s), amnesic(s), or anxiolytics, the patient was informed of the treatment options, risks and possible complications. To fulfill our ethical and legal obligations, as recommended by the American Medical Association's Code of Ethics, I have informed the patient of my clinical impression; the nature and purpose of the treatment or procedure; the risks, benefits, and possible complications of the intervention; the alternatives, including doing nothing; the risk(s) and benefit(s) of the alternative treatment(s) or procedure(s); and the risk(s) and benefit(s) of doing nothing. The  patient was provided information about the general risks and possible complications associated with the procedure. These may include, but are not limited to: failure to achieve desired goals, infection, bleeding, organ or nerve  damage, allergic reactions, paralysis, and death. In addition, the patient was informed of those risks and complications associated to the procedure, such as failure to decrease pain; infection; bleeding; organ or nerve damage with subsequent damage to sensory, motor, and/or autonomic systems, resulting in permanent pain, numbness, and/or weakness of one or several areas of the body; allergic reactions; (i.e.: anaphylactic reaction); and/or death. Furthermore, the patient was informed of those risks and complications associated with the medications. These include, but are not limited to: allergic reactions (i.e.: anaphylactic or anaphylactoid reaction(s)); adrenal axis suppression; blood sugar elevation that in diabetics may result in ketoacidosis or comma; water retention that in patients with history of congestive heart failure may result in shortness of breath, pulmonary edema, and decompensation with resultant heart failure; weight gain; swelling or edema; medication-induced neural toxicity; particulate matter embolism and blood vessel occlusion with resultant organ, and/or nervous system infarction; and/or aseptic necrosis of one or more joints. Finally, the patient was informed that Medicine is not an exact science; therefore, there is also the possibility of unforeseen or unpredictable risks and/or possible complications that may result in a catastrophic outcome. The patient indicated having understood very clearly. We have given the patient no guarantees and we have made no promises. Enough time was given to the patient to ask questions, all of which were answered to the patient's satisfaction. Jessica Haas has indicated that she wanted to continue with the procedure. Attestation:  I, the ordering provider, attest that I have discussed with the patient the benefits, risks, side-effects, alternatives, likelihood of achieving goals, and potential problems during recovery for the procedure that I have provided informed consent. Date  Time: 02/02/2024  1:02 PM  Pre-Procedure Preparation:  Monitoring: As per clinic protocol. Respiration, ETCO2, SpO2, BP, heart rate and rhythm monitor placed and checked for adequate function Safety Precautions: Patient was assessed for positional comfort and pressure points before starting the procedure. Time-out: I initiated and conducted the Time-out before starting the procedure, as per protocol. The patient was asked to participate by confirming the accuracy of the Time Out information. Verification of the correct person, site, and procedure were performed and confirmed by me, the nursing staff, and the patient. Time-out conducted as per Joint Commission's Universal Protocol (UP.01.01.01). Time: 1340 Start Time: 1340 hrs.  Description of Procedure:          Target Area: Space between [facility] and Trapezium Approach: Palmar approach. Area Prepped: Entire wrist Region ChloraPrep (2% chlorhexidine gluconate and 70% isopropyl alcohol) Safety Precautions: Aspiration looking for blood return was conducted prior to all injections. At no point did we inject any substances, as a needle was being advanced. No attempts were made at seeking any paresthesias. Safe injection practices and needle disposal techniques used. Medications properly checked for expiration dates. SDV (single dose vial) medications used. Description of the Procedure: Protocol guidelines were followed. The patient was placed in position. The target area was identified and the area prepped in the usual manner. Skin & deeper tissues infiltrated with local anesthetic. Appropriate amount of time allowed to pass for local anesthetics to take effect. The procedure needles were then advanced  to the target area. Proper needle placement secured. Negative aspiration confirmed. Solution injected in intermittent fashion, asking for systemic symptoms every 0.5cc of injectate. The needles were then removed and the area cleansed, making sure to leave some of the prepping solution back to take advantage of its long term bactericidal properties.  Vitals:   02/02/24 1316  BP: 99/63  Pulse: 95  Resp: 18  Temp: (!) 97.3 F (36.3 C)  TempSrc: Temporal  SpO2: 100%  Weight: 139 lb (63 kg)  Height: 5' 5 (1.651 m)     Start Time: 1340 hrs. End Time:   hrs. Materials:  Needle(s) Type: Regular needle Gauge: 25G Length: 1.5-in 4 cc solution made of 2 cc of 2% lidocaine ,  2 cc of Decadron  10 mg/cc.  2 cc injected for the left CMC joint, 2 cc injected for the right [facility] joint  Antibiotic Prophylaxis:   Anti-infectives (From admission, onward)    None      Indication(s): None identified  Post-operative Assessment:  Post-procedure Vital Signs:  Pulse/HCG Rate: 95  Temp: (!) 97.3 F (36.3 C) Resp: 18 BP: 99/63 SpO2: 100 %  EBL: None  Complications: No immediate post-treatment complications observed by team, or reported by patient.  Note: The patient tolerated the entire procedure well. A repeat set of vitals were taken after the procedure and the patient was kept under observation following institutional policy, for this type of procedure. Post-procedural neurological assessment was performed, showing return to baseline, prior to discharge. The patient was provided with post-procedure discharge instructions, including a section on how to identify potential problems. Should any problems arise concerning this procedure, the patient was given instructions to immediately contact us , at any time, without hesitation. In any case, we plan to contact the patient by telephone for a follow-up status report regarding this interventional procedure.  Comments:  No additional relevant  information.  Plan of Care (POC)  Patient is also endorsing increased axial low back pain related to lumbar facet arthropathy and spondylosis.  Her previous lumbar radiofrequency ablation bilaterally at L3, L4, L5 was on 09/03/2023 which provided her with 75% pain relief for approximately 5 months now with gradual return.  We discussed repeating bilateral RFA after November 14   Medications ordered for procedure: Meds ordered this encounter  Medications   lidocaine  (XYLOCAINE ) 2 % (with pres) injection 400 mg   dexamethasone  (DECADRON ) injection 20 mg   methylPREDNISolone  acetate (DEPO-MEDROL ) injection 40 mg   Medications administered: Jerusalem B. Poirier had no medications administered during this visit.  See the medical record for exact dosing, route, and time of administration.    BLF L3,4,5 05/26/23, #2 07/02/23; B/L L3,4,5 RFA 09/03/23 B/L CMC injections: 10/29/23       Follow-up plan:   Return for B/L L3,4,5 RFA #2.     Recent Visits No visits were found meeting these conditions. Showing recent visits within past 90 days and meeting all other requirements Today's Visits Date Type Provider Dept  02/02/24 Procedure visit Marcelino Nurse, MD [facility]  Showing today's visits and meeting all other requirements Future Appointments Date Type Provider Dept  03/08/24 Appointment Marcelino Nurse, MD [facility]  Showing future appointments within next 90 days and meeting all other requirements   Disposition: Discharge home  Discharge (Date  Time): 02/02/2024;   hrs.   Primary Care Physician: Anitra Arlean FALCON, MD Location: [facility] Note by: Nurse Marcelino, MD (TTS technology used. I apologize for any typographical errors that were not detected and corrected.) Date: 02/02/2024; Time: 2:09 PM  Disclaimer:  Medicine is not an Visual merchandiser. The only guarantee in medicine is that nothing is guaranteed. It is important to note that the  decision to proceed with this intervention was based on the information collected from the patient. The Data and conclusions were drawn from the  patient's questionnaire, the interview, and the physical examination. Because the information was provided in large part by the patient, it cannot be guaranteed that it has not been purposely or unconsciously manipulated. Every effort has been made to obtain as much relevant data as possible for this evaluation. It is important to note that the conclusions that lead to this procedure are derived in large part from the available data. Always take into account that the treatment will also be dependent on availability of resources and existing treatment guidelines, considered by other Pain Management Practitioners as being common knowledge and practice, at the time of the intervention. For Medico-Legal purposes, it is also important to point out that variation in procedural techniques and pharmacological choices are the acceptable norm. The indications, contraindications, technique, and results of the above procedure should only be interpreted and judged by a Board-Certified Interventional Pain Specialist with extensive familiarity and expertise in the same exact procedure and technique.

## 2024-02-02 NOTE — Progress Notes (Signed)
 PROVIDER NOTE: Interpretation of information contained herein should be left to medically-trained personnel. Specific patient instructions are provided elsewhere under Patient Instructions section of medical record. This document was created in part using STT-dictation technology, any transcriptional errors that may result from this process are unintentional.  Patient: Jessica Haas Type: Established DOB: 1950-09-20 MRN: [id number] PCP: Anitra Arlean FALCON, MD  Service: Procedure DOS: 02/02/2024 Setting: Ambulatory Location: Ambulatory outpatient facility Delivery: Face-to-face Provider: Wallie Sherry, MD Specialty: Interventional Pain Management Specialty designation: 09 Location: Outpatient facility Ref. Prov.: Anitra Arlean FALCON, MD       Interventional Therapy   Type:  Steroid Intra-articular Knee Injection #2  Laterality: Right (-RT) Level/approach: Medial Imaging guidance: None required ([id number]) Anesthesia: Local anesthesia (1-2% Lidocaine ) Sedation:                         DOS: 02/02/2024  Performed by: Wallie Sherry, MD  Purpose: Diagnostic/Therapeutic Indications: Knee arthralgia associated to osteoarthritis of the knee  NAS-11 score:   Pre-procedure: 8 /10   Post-procedure: 8 /10     Pre-Procedure Preparation  Monitoring: As per clinic protocol.  Risk Assessment: Vitals:  AFP:Zdupfjuzi body mass index is 23.13 kg/m as calculated from the following:   Height as of this encounter: 5' 5 (1.651 m).   Weight as of this encounter: 139 lb (63 kg)., Rate:95 , BP:99/63, Resp:18, Temp:(!) 97.3 F (36.3 C), SpO2:100 %  Allergies: She is allergic to doxycycline, erythromycin, lidocaine , losartan, rosuvastatin, tetracyclines & related, and thimerosal (thiomersal).  Precautions: No additional precautions required  Blood-thinner(s): None at this time  Coagulopathies: Reviewed. None identified.   Active Infection(s): Reviewed. None identified. Jessica Haas is afebrile   Location  setting: Exam room Position: Sitting w/ knee bent 90 degrees Safety Precautions: Patient was assessed for positional comfort and pressure points before starting the procedure. Prepping solution: DuraPrep (Iodine  Povacrylex [0.7% available iodine ] and Isopropyl Alcohol, 74% w/w) Prep Area: Entire knee region Approach: percutaneous, just above the tibial plateau, lateral to the infrapatellar tendon. Intended target: Intra-articular knee space Materials: Tray: Block Needle(s): Regular Qty: 1/side Length: 1.5-inch Gauge: 25G (x1) + 22G (x1)  Meds ordered this encounter  Medications   lidocaine  (XYLOCAINE ) 2 % (with pres) injection 400 mg   dexamethasone  (DECADRON ) injection 20 mg   methylPREDNISolone  acetate (DEPO-MEDROL ) injection 40 mg    Orders Placed This Encounter  Procedures   Radiofrequency,Lumbar    Standing Status:   Future    Expected Date:   02/04/2024    Expiration Date:   02/01/2025    Scheduling Instructions:     Procedure: Lumbar Facet, Medial Branch Radiofrequency Ablation (RFA) #2      Laterality: Bilateral (-50)      Level: L3, L4, and L5 Medial Branch Level(s). These levels will denervate the L3-4 and L4-5 lumbar facet joints.      Imaging: Fluoroscopy-guided             Anesthesia: Local anesthesia (1-2% Lidocaine )     Sedation: Minimal Sedation    Where will this procedure be performed?:   [facility]     Time-out: 1340 I initiated and conducted the Time-out before starting the procedure, as per protocol. The patient was asked to participate by confirming the accuracy of the Time Out information. Verification of the correct person, site, and procedure were performed and confirmed by me, the nursing staff, and the patient. Time-out conducted as per Joint Commission's Universal Protocol (UP.01.01.01). Procedure  checklist: Completed  H&P (Pre-op  Assessment)  Jessica Haas is a 73 y.o. (year old), female patient, seen today for interventional  treatment. She  has a past surgical history that includes Abdominal hysterectomy; Carpal tunnel release; Foot Fusion (Left); Cataract extraction w/PHACO (Left, 12/19/2015); Cataract extraction w/PHACO (Right, 02/13/2016); Cervical fusion; and Bone excision (Right, 06/04/2018). [name] has a current medication list which includes the following prescription(s): acetaminophen , alprazolam , celecoxib , ciclopirox , estradiol, fluconazole , hydrochlorothiazide, mupirocin  ointment, pitavastatin calcium, tizanidine , trazodone, altreno , tretinoin , zolpidem , fluoxetine , and soolantra , and the following Facility-Administered Medications: dexamethasone , lidocaine , and methylprednisolone  acetate. Her primarily concern today is the Pain (Bilateral thumbs) and Knee Pain (right)  She is allergic to doxycycline, erythromycin, lidocaine , losartan, rosuvastatin, tetracyclines & related, and thimerosal (thiomersal).   Last encounter: My last encounter with her was on 10/29/2023. Pertinent problems: Jessica Haas does not have any pertinent problems on file. Pain Assessment: Severity of Chronic pain is reported as a 8 /10. Location: Knee Right/denies. Onset: More than a month ago. Quality: Tender. Timing: Intermittent. Modifying factor(s): positioning. Vitals:  height is 5' 5 (1.651 m) and weight is 139 lb (63 kg). Her temporal temperature is 97.3 F (36.3 C) (abnormal). Her blood pressure is 99/63 and her pulse is 95. Her respiration is 18 and oxygen  saturation is 100%.   Reason for encounter: interventional pain management therapy due pain of at least four (4) weeks in duration, with failure to respond and/or inability to tolerate more conservative care.  Site Confirmation: Jessica Haas was asked to confirm the procedure and laterality before marking the site.  Consent: Before the procedure and under the influence of no sedative(s), amnesic(s), or anxiolytics, the patient was informed of the treatment options, risks and  possible complications. To fulfill our ethical and legal obligations, as recommended by the American Medical Association's Code of Ethics, I have informed the patient of my clinical impression; the nature and purpose of the treatment or procedure; the risks, benefits, and possible complications of the intervention; the alternatives, including doing nothing; the risk(s) and benefit(s) of the alternative treatment(s) or procedure(s); and the risk(s) and benefit(s) of doing nothing. The patient was provided information about the general risks and possible complications associated with the procedure. These may include, but are not limited to: failure to achieve desired goals, infection, bleeding, organ or nerve damage, allergic reactions, paralysis, and death. In addition, the patient was informed of those risks and complications associated to Spine-related procedures, such as failure to decrease pain; infection (i.e.: Meningitis, epidural or intraspinal abscess); bleeding (i.e.: epidural hematoma, subarachnoid hemorrhage, or any other type of intraspinal or peri-dural bleeding); organ or nerve damage (i.e.: Any type of peripheral nerve, nerve root, or spinal cord injury) with subsequent damage to sensory, motor, and/or autonomic systems, resulting in permanent pain, numbness, and/or weakness of one or several areas of the body; allergic reactions; (i.e.: anaphylactic reaction); and/or death. Furthermore, the patient was informed of those risks and complications associated with the medications. These include, but are not limited to: allergic reactions (i.e.: anaphylactic or anaphylactoid reaction(s)); adrenal axis suppression; blood sugar elevation that in diabetics may result in ketoacidosis or comma; water retention that in patients with history of congestive heart failure may result in shortness of breath, pulmonary edema, and decompensation with resultant heart failure; weight gain; swelling or edema;  medication-induced neural toxicity; particulate matter embolism and blood vessel occlusion with resultant organ, and/or nervous system infarction; and/or aseptic necrosis of one or more joints. Finally, the patient was informed that  Medicine is not an exact science; therefore, there is also the possibility of unforeseen or unpredictable risks and/or possible complications that may result in a catastrophic outcome. The patient indicated having understood very clearly. We have given the patient no guarantees and we have made no promises. Enough time was given to the patient to ask questions, all of which were answered to the patient's satisfaction. Ms. Kivett has indicated that she wanted to continue with the procedure. Attestation: I, the ordering provider, attest that I have discussed with the patient the benefits, risks, side-effects, alternatives, likelihood of achieving goals, and potential problems during recovery for the procedure that I have provided informed consent.  Date  Time: 02/02/2024  1:02 PM  Description of procedure  Start Time: 1340 hrs  Local Anesthesia: Once the patient was positioned, prepped, and time-out was completed. The target area was identified located. The skin was marked with an approved surgical skin marker. Once marked, the skin (epidermis, dermis, and hypodermis), and deeper tissues (fat, connective tissue and muscle) were infiltrated with a small amount of a short-acting local anesthetic, loaded on a 10cc syringe with a 25G, 1.5-in  Needle. An appropriate amount of time was allowed for local anesthetics to take effect before proceeding to the next step. Local Anesthetic: Lidocaine  1-2% The unused portion of the local anesthetic was discarded in the proper designated containers. Safety Precautions: Aspiration looking for blood return was conducted prior to all injections. At no point did I inject any substances, as a needle was being advanced. Before injecting, the  patient was told to immediately notify me if she was experiencing any new onset of ringing in the ears, or metallic taste in the mouth. No attempts were made at seeking any paresthesias. Safe injection practices and needle disposal techniques used. Medications properly checked for expiration dates. SDV (single dose vial) medications used. After the completion of the procedure, all disposable equipment used was discarded in the proper designated medical waste containers.  Technical description: Protocol guidelines were followed. After positioning, the target area was identified and prepped in the usual manner. Skin & deeper tissues infiltrated with local anesthetic. Appropriate amount of time allowed to pass for local anesthetics to take effect. Proper needle placement secured. Once satisfactory needle placement was confirmed, I proceeded to inject the desired solution in slow, incremental fashion, intermittently assessing for discomfort or any signs of abnormal or undesired spread of substance. Once completed, the needle was removed and disposed of, as per hospital protocols. The area was cleaned, making sure to leave some of the prepping solution back to take advantage of its long term bactericidal properties.  5 cc solution made of 4 cc of 2% lidocaine , 1 cc of methylprednisolone , 40 mg/cc.  Injected into the right knee joint.  Aspiration:  Negative        Vitals:   02/02/24 1316  BP: 99/63  Pulse: 95  Resp: 18  Temp: (!) 97.3 F (36.3 C)  TempSrc: Temporal  SpO2: 100%  Weight: 139 lb (63 kg)  Height: 5' 5 (1.651 m)    End Time:   hrs    Post-op assessment  Post-procedure Vital Signs:  Pulse/HCG Rate: 95  Temp: (!) 97.3 F (36.3 C) Resp: 18 BP: 99/63 SpO2: 100 %  EBL: None  Complications: No immediate post-treatment complications observed by team, or reported by patient.  Note: The patient tolerated the entire procedure well. A repeat set of vitals were taken after  the procedure and the patient was kept  under observation following institutional policy, for this type of procedure. Post-procedural neurological assessment was performed, showing return to baseline, prior to discharge. The patient was provided with post-procedure discharge instructions, including a section on how to identify potential problems. Should any problems arise concerning this procedure, the patient was given instructions to immediately contact us , at any time, without hesitation. In any case, we plan to contact the patient by telephone for a follow-up status report regarding this interventional procedure.  Comments:  No additional relevant information.  Plan of care   Medications administered: Storey B. Haring had no medications administered during this visit.  Follow-up plan:   Return for B/L L3,4,5 RFA #2.       Recent Visits No visits were found meeting these conditions. Showing recent visits within past 90 days and meeting all other requirements Today's Visits Date Type Provider Dept  02/02/24 Procedure visit Marcelino Nurse, MD [facility]  Showing today's visits and meeting all other requirements Future Appointments Date Type Provider Dept  03/08/24 Appointment Marcelino Nurse, MD [facility]  Showing future appointments within next 90 days and meeting all other requirements   Disposition: Discharge home  Discharge (Date  Time): 02/02/2024;   hrs.   Primary Care Physician: Anitra Arlean FALCON, MD Location: [facility] Note by: Nurse Marcelino, MD Date: 02/02/2024; Time: 2:09 PM  DISCLAIMER: Medicine is not an exact science. It has no guarantees or warranties. The decision to proceed with this intervention was based on the information collected from the patient. Conclusions were drawn from the patient's questionnaire, interview, and examination. Because information was provided in large part by the patient, it cannot be  guaranteed that it has not been purposely or unconsciously manipulated or altered. Every effort has been made to obtain as much accurate, relevant, available data as possible. Always take into account that the treatment will also be dependent on availability of resources and existing treatment guidelines, considered by other Pain Management Specialists as being common knowledge and practice, at the time of the intervention. It is also important to point out that variation in procedural techniques and pharmacological choices are the acceptable norm. For Medico-Legal review purposes, the indications, contraindications, technique, and results of the these procedures should only be evaluated, judged and interpreted by a Board-Certified Interventional Pain Specialist with extensive familiarity and expertise in the same exact procedure and technique.

## 2024-02-02 NOTE — Patient Instructions (Signed)

## 2024-02-03 ENCOUNTER — Telehealth: Payer: Self-pay

## 2024-02-03 NOTE — Telephone Encounter (Signed)
 Called Jessica Haas patient denies pain. States everything was wonderful. Instructed to call if needed.

## 2024-02-17 ENCOUNTER — Ambulatory Visit: Payer: Self-pay

## 2024-02-17 DIAGNOSIS — Z791 Long term (current) use of non-steroidal anti-inflammatories (NSAID): Secondary | ICD-10-CM | POA: Diagnosis not present

## 2024-02-17 DIAGNOSIS — K5730 Diverticulosis of large intestine without perforation or abscess without bleeding: Secondary | ICD-10-CM | POA: Diagnosis not present

## 2024-02-17 DIAGNOSIS — Z83719 Family history of colon polyps, unspecified: Secondary | ICD-10-CM | POA: Diagnosis not present

## 2024-02-17 DIAGNOSIS — D509 Iron deficiency anemia, unspecified: Secondary | ICD-10-CM | POA: Diagnosis present

## 2024-03-01 ENCOUNTER — Telehealth: Payer: Self-pay | Admitting: Student in an Organized Health Care Education/Training Program

## 2024-03-01 DIAGNOSIS — M170 Bilateral primary osteoarthritis of knee: Secondary | ICD-10-CM

## 2024-03-01 DIAGNOSIS — M180 Bilateral primary osteoarthritis of first carpometacarpal joints: Secondary | ICD-10-CM

## 2024-03-01 NOTE — Telephone Encounter (Signed)
 Patient wants to have knee injections on the 17th along with RFA

## 2024-03-03 NOTE — Progress Notes (Signed)
 [facility] Return Visit  History of Present Illness Jessica Haas is a 73 year old female with iron deficiency anemia who presents for follow-up regarding her iron levels and anemia management.  She has a history of a chronic anemia with fluctuating iron levels. Her iron levels have recently decreased from 181 to 130 to 28, and her hematocrit has ranged from 33 to 32.8. She has previously received IV iron. Her ferritin levels were stable five days ago, but her blood counts remain lower than ideal. She underwent an esophagogastroduodenoscopy and colonoscopy, both unremarkable, and has had a capsule endoscopy in the past.  She experiences knee pain, particularly when descending stairs. She has received a steroid injection in the past for this issue. Additionally, she reports ongoing toe pain persisting for two years and has a history of severe arthritis in the midfoot, similar to her left foot for which she had surgery.  She is currently taking Ambien  for sleep, which is effective. She also takes Celebrex  once daily for pain management.  She lives with her husband, Jessica Haas, in a home with a handicapped-accessible bathroom. She has three children, one of whom is getting married soon. She is planning a wedding for her youngest son, Jessica Haas, in [place] the day after Thanksgiving.  Current Outpatient Medications  Medication Sig Dispense Refill  . acetaminophen  (TYLENOL ) 500 MG tablet Take by mouth as needed      . ALPRAZolam  (XANAX ) 0.25 MG tablet TAKE 1 TABLET BY MOUTH EVERY DAY AS NEEDED FOR SLEEP OR ANXIETY 30 tablet 5  . benzonatate  (TESSALON ) 200 MG capsule TAKE 1 CAPSULE BY MOUTH TWICE DAILY AS NEEDED FOR COUGH 40 capsule 12  . celecoxib  (CELEBREX ) 200 MG capsule TAKE 1 CAPSULE BY MOUTH TWICE DAILY 180 capsule 2  . cholecalciferol (VITAMIN D3) 1000 unit capsule Take 1,000 Units by mouth once daily    . ciclopirox  (LOPROX ) 0.77 % topical gel Apply BID to aa.    . cyanocobalamin  (VITAMIN B12) 1,000 mcg SL tablet Take by mouth    . docosahexaenoic acid/epa (FISH OIL ORAL) Take by mouth    . estradioL (DOTTI) 0.025 mg/24 hr semiwkly patch PLACE 1 PATCH ONTO SKIN TWICE A WEEK 8 patch 12  . fluconazole  (DIFLUCAN ) 200 MG tablet Take 200 mg by mouth    . FLUoxetine  (PROZAC ) 10 MG capsule Take 1 capsule (10 mg total) by mouth once daily 30 capsule 11  . hydroCHLOROthiazide (HYDRODIURIL) 12.5 MG tablet TAKE 1 TABLET BY MOUTH DAILY 90 tablet 3  . pitavastatin calcium 2 mg Tab Take 1 tablet (2 mg total) by mouth once daily 90 tablet 3  . promethazine  (PHENERGAN ) 12.5 MG tablet Take 1 tablet (12.5 mg total) by mouth every 6 (six) hours as needed for Nausea for up to 90 days 30 tablet 2  . SOOLANTRA  1 % Crea Apply 1 Application topically nightly      . tiZANidine  (ZANAFLEX ) 4 MG tablet TAKE ONE TABLET BY MOUTH TWICE DAILY AS NEEDED FOR MUSCLE SPASMS 60 tablet 3  . traZODone (DESYREL) 50 MG tablet TAKE TWO TABLETS BY MOUTH AT BEDTIME 180 tablet 3  . tretinoin  (ALTRENO ) 0.05 % Lotn Apply topically    . zolpidem  (AMBIEN  CR) 12.5 MG CR tablet Take 1 tablet (12.5 mg total) by mouth at bedtime 90 tablet 0   No current facility-administered medications for this visit.    The following portions of the patient's history were reviewed and updated in the [facility] as appropriate:  allergies, current medications, past family history, past medical history, past social history, past surgical history and problem list.   Physical Exam:  BP 120/76 (BP Location: Left upper arm, Patient Position: Sitting, BP Cuff Size: Adult)   Pulse 88   Temp 36.4 C (97.5 F) (Oral)   LMP  (LMP Unknown)  Physical Exam VITALS: BP- 120/76 MEASUREMENTS: BMI- 23.0. CHEST: Lungs clear to auscultation bilaterally. CARDIOVASCULAR: Regular rate and rhythm, no murmurs, rubs, or gallops. Extremities: She is tender over her anserine bursa.  There is still some white skin color changes between her 4th and 5th toes.   The skin has healed and closed. Bony enlargement of her mid foot.  Results LABS Hb: 10.5 Hematocrit: 32.8 Ferritin: 02/27/2024 Iron: 28 A1c: 5.5 Average Blood Glucose: 111 Urinalysis: Trace blood, 1 RBC TSH: Normal  DIAGNOSTIC Colonoscopy: Normal EGD: Normal  Assessment & Plan Chronic anemia: Iron levels have decreased from 181 to 28. Blood counts are stable but lower than desired, with hematocrit levels between 32.8 and 35. Ferritin levels remain adequate. Concerns exist about bone marrow suppression, though no significant drop in other cell lines suggests an isolated red blood cell issue. Ordered iron TIBC saturation test, reticulocyte count, and EPO level test. Will consider EPO injections if levels are low and will monitor blood counts and iron levels.  Chronic right fifth toe ulcer, improved   The ulcer has healed significantly between the toes.  She has cotton between the holes for padding. No surgical intervention is required at this time. Continue current management without surgical intervention.  Insomnia   Chronic insomnia is managed with high-dose Ambien , raising concerns about memory issues and sedation. Discussed potential use of Belsomra, which targets sleep receptors and may allow for reduced Ambien  dosage. She is open to trying Belsomra. Prescribed Belsomra and advised to consider reducing Ambien  dosage 1/2 with starting Belsomra.  Osteoarthritis of right midfoot   Severe arthritis in the right midfoot mirrors previous left foot issues. Surgery is an option but not preferred unless necessary. Continue conservative management and will consider referral to an orthopedic specialist if symptoms worsen.  Bursitis of right knee   Bursitis in the right knee, particularly when descending stairs, with previous steroid injection in the knee joint. Pain management continues with Celebrex . Continue Celebrex  for pain management and will consider steroid injection in the bursa if  pain persists.   Disposition: Return in about 4 months (around 07/01/2024), or sleep.  I spent a total of 30 minutes in both face-to-face and non-face-to-face activities for this visit on the date of this encounter  This note has been created using automated tools and reviewed for accuracy by ANNE FRANCES PHELPS.

## 2024-03-08 ENCOUNTER — Ambulatory Visit: Admitting: Student in an Organized Health Care Education/Training Program

## 2024-03-08 ENCOUNTER — Ambulatory Visit
Admission: RE | Admit: 2024-03-08 | Discharge: 2024-03-08 | Disposition: A | Discharge status: Home / Self Care | Source: Ambulatory Visit | Attending: Student in an Organized Health Care Education/Training Program | Admitting: Student in an Organized Health Care Education/Training Program

## 2024-03-08 ENCOUNTER — Encounter: Payer: Self-pay | Admitting: Student in an Organized Health Care Education/Training Program

## 2024-03-08 VITALS — BP 139/87 | HR 85 | Temp 97.5°F | Resp 16 | Ht 65.0 in | Wt 139.0 lb

## 2024-03-08 DIAGNOSIS — M170 Bilateral primary osteoarthritis of knee: Secondary | ICD-10-CM | POA: Insufficient documentation

## 2024-03-08 DIAGNOSIS — M47816 Spondylosis without myelopathy or radiculopathy, lumbar region: Secondary | ICD-10-CM | POA: Diagnosis present

## 2024-03-08 DIAGNOSIS — M1711 Unilateral primary osteoarthritis, right knee: Secondary | ICD-10-CM | POA: Diagnosis not present

## 2024-03-08 DIAGNOSIS — M180 Bilateral primary osteoarthritis of first carpometacarpal joints: Secondary | ICD-10-CM

## 2024-03-08 MED ORDER — MIDAZOLAM HCL 2 MG/2ML IJ SOLN
INTRAMUSCULAR | Status: AC
  Filled 2024-03-08: qty 2

## 2024-03-08 MED ORDER — LIDOCAINE HCL 2 % IJ SOLN
20.0000 mL | Freq: Once | INTRAMUSCULAR | Status: AC
  Administered 2024-03-08: 400 mg
  Filled 2024-03-08: qty 40

## 2024-03-08 MED ORDER — METHYLPREDNISOLONE ACETATE 40 MG/ML IJ SUSP
40.0000 mg | Freq: Once | INTRAMUSCULAR | Status: AC
  Administered 2024-03-08: 40 mg via INTRA_ARTICULAR
  Filled 2024-03-08: qty 1

## 2024-03-08 MED ORDER — DEXAMETHASONE SOD PHOSPHATE PF 10 MG/ML IJ SOLN
20.0000 mg | Freq: Once | INTRAMUSCULAR | Status: AC
  Administered 2024-03-08: 20 mg

## 2024-03-08 MED ORDER — DEXAMETHASONE SOD PHOSPHATE PF 10 MG/ML IJ SOLN
10.0000 mg | Freq: Once | INTRAMUSCULAR | Status: AC
  Administered 2024-03-08: 10 mg

## 2024-03-08 MED ORDER — ROPIVACAINE HCL 2 MG/ML IJ SOLN
18.0000 mL | Freq: Once | INTRAMUSCULAR | Status: AC
  Administered 2024-03-08: 18 mL via PERINEURAL
  Filled 2024-03-08: qty 20

## 2024-03-08 NOTE — Progress Notes (Signed)
 PROVIDER NOTE: Interpretation of information contained herein should be left to medically-trained personnel. Specific patient instructions are provided elsewhere under Patient Instructions section of medical record. This document was created in part using STT-dictation technology, any transcriptional errors that may result from this process are unintentional.  Patient: Jessica Haas Type: Established DOB: 1950-10-16 MRN: [id number] PCP: Anitra Arlean FALCON, MD  Service: Procedure DOS: 03/08/2024 Setting: Ambulatory Location: Ambulatory outpatient facility Delivery: Face-to-face Provider: Wallie Sherry, MD Specialty: Interventional Pain Management Specialty designation: 09 Location: Outpatient facility Ref. Prov.: Anitra Arlean FALCON, MD       Interventional Therapy   Primary Reason for Visit: Interventional Pain Management Treatment. CC: Back Pain (Lower back; right knee; both thumbs)    Procedure:          Anesthesia, Analgesia, Anxiolysis:  Type: Diagnostic CMC (Carpometacarpal) Steroid Injection           Region: Palmar aspect Interspace between [facility] and Trapezium Level: Wrist Laterality: Bilateral  Type: Local Anesthesia Local Anesthetic: Lidocaine  1-2%    Position: Sitting   1. Lumbar facet arthropathy   2. Lumbar spondylosis   3. Arthritis of carpometacarpal (CMC) joint of both thumbs   4. Bilateral primary osteoarthritis of knee    NAS-11 Pain score:   Pre-procedure: 5 /10   Post-procedure: 3 /10     H&P (Pre-op Assessment):  Jessica Haas is a 73 y.o. (year old), female patient, seen today for interventional treatment. She  has a past surgical history that includes Abdominal hysterectomy; Carpal tunnel release; Foot Fusion (Left); Cataract extraction w/PHACO (Left, 12/19/2015); Cataract extraction w/PHACO (Right, 02/13/2016); Cervical fusion; and Bone excision (Right, 06/04/2018). Jessica Haas has a current medication list which includes the following prescription(s):  acetaminophen , alprazolam , benzonatate , celecoxib , ciclopirox , estradiol, fluconazole , fluoxetine , hydrochlorothiazide, mupirocin  ointment, pitavastatin calcium, soolantra , tizanidine , trazodone, altreno , tretinoin , and zolpidem . Her primarily concern today is the Back Pain (Lower back; right knee; both thumbs)  Initial Vital Signs:  Pulse/HCG Rate: 85ECG Heart Rate: 84 Temp: (!) 97.5 F (36.4 C) Resp: 17 BP: 109/67 SpO2: 98 %  BMI: Estimated body mass index is 23.13 kg/m as calculated from the following:   Height as of this encounter: 5' 5 (1.651 m).   Weight as of this encounter: 139 lb (63 kg).  Risk Assessment: Allergies: Reviewed. She is allergic to doxycycline, erythromycin, lidocaine , losartan, rosuvastatin, tetracyclines & related, and thimerosal (thiomersal).  Allergy Precautions: None required Coagulopathies: Reviewed. None identified.  Blood-thinner therapy: None at this time Active Infection(s): Reviewed. None identified. Jessica Haas is afebrile  Site Confirmation: Jessica Haas was asked to confirm the procedure and laterality before marking the site Procedure checklist: Completed Consent: Before the procedure and under the influence of no sedative(s), amnesic(s), or anxiolytics, the patient was informed of the treatment options, risks and possible complications. To fulfill our ethical and legal obligations, as recommended by the American Medical Association's Code of Ethics, I have informed the patient of my clinical impression; the nature and purpose of the treatment or procedure; the risks, benefits, and possible complications of the intervention; the alternatives, including doing nothing; the risk(s) and benefit(s) of the alternative treatment(s) or procedure(s); and the risk(s) and benefit(s) of doing nothing. The patient was provided information about the general risks and possible complications associated with the procedure. These may include, but are not limited to:  failure to achieve desired goals, infection, bleeding, organ or nerve damage, allergic reactions, paralysis, and death. In addition, the patient was informed of those risks and complications  associated to the procedure, such as failure to decrease pain; infection; bleeding; organ or nerve damage with subsequent damage to sensory, motor, and/or autonomic systems, resulting in permanent pain, numbness, and/or weakness of one or several areas of the body; allergic reactions; (i.e.: anaphylactic reaction); and/or death. Furthermore, the patient was informed of those risks and complications associated with the medications. These include, but are not limited to: allergic reactions (i.e.: anaphylactic or anaphylactoid reaction(s)); adrenal axis suppression; blood sugar elevation that in diabetics may result in ketoacidosis or comma; water retention that in patients with history of congestive heart failure may result in shortness of breath, pulmonary edema, and decompensation with resultant heart failure; weight gain; swelling or edema; medication-induced neural toxicity; particulate matter embolism and blood vessel occlusion with resultant organ, and/or nervous system infarction; and/or aseptic necrosis of one or more joints. Finally, the patient was informed that Medicine is not an exact science; therefore, there is also the possibility of unforeseen or unpredictable risks and/or possible complications that may result in a catastrophic outcome. The patient indicated having understood very clearly. We have given the patient no guarantees and we have made no promises. Enough time was given to the patient to ask questions, all of which were answered to the patient's satisfaction. Jessica Haas has indicated that she wanted to continue with the procedure. Attestation: I, the ordering provider, attest that I have discussed with the patient the benefits, risks, side-effects, alternatives, likelihood of achieving goals, and  potential problems during recovery for the procedure that I have provided informed consent. Date  Time: 03/08/2024 11:15 AM  Pre-Procedure Preparation:  Monitoring: As per clinic protocol. Respiration, ETCO2, SpO2, BP, heart rate and rhythm monitor placed and checked for adequate function Safety Precautions: Patient was assessed for positional comfort and pressure points before starting the procedure. Time-out: I initiated and conducted the Time-out before starting the procedure, as per protocol. The patient was asked to participate by confirming the accuracy of the Time Out information. Verification of the correct person, site, and procedure were performed and confirmed by me, the nursing staff, and the patient. Time-out conducted as per Joint Commission's Universal Protocol (UP.01.01.01). Time: 1245 Start Time: 1245 hrs.  Description of Procedure:          Target Area: Space between [facility] and Trapezium Approach: Palmar approach. Area Prepped: Entire wrist Region ChloraPrep (2% chlorhexidine gluconate and 70% isopropyl alcohol) Safety Precautions: Aspiration looking for blood return was conducted prior to all injections. At no point did we inject any substances, as a needle was being advanced. No attempts were made at seeking any paresthesias. Safe injection practices and needle disposal techniques used. Medications properly checked for expiration dates. SDV (single dose vial) medications used. Description of the Procedure: Protocol guidelines were followed. The patient was placed in position. The target area was identified and the area prepped in the usual manner. Skin & deeper tissues infiltrated with local anesthetic. Appropriate amount of time allowed to pass for local anesthetics to take effect. The procedure needles were then advanced to the target area. Proper needle placement secured. Negative aspiration confirmed. Solution injected in intermittent fashion, asking for systemic symptoms  every 0.5cc of injectate. The needles were then removed and the area cleansed, making sure to leave some of the prepping solution back to take advantage of its long term bactericidal properties.  Vitals:   03/08/24 1301 03/08/24 1306 03/08/24 1319 03/08/24 1320  BP: 137/89 (!) 135/91 (!) 142/98 139/87  Pulse:  Resp: 19 13 16    Temp:      SpO2: 98% 100% 99%   Weight:      Height:        Start Time: 1245 hrs. End Time: 1314 hrs. Materials:  Needle(s) Type: Regular needle Gauge: 25G Length: 1.5-in 4 cc solution made of 3 cc of 0.2% Ropivacaine ,  1 cc of Decadron  10 mg/cc.  2 cc injected for the left CMC joint, 2 cc injected for the right [facility] joint  Antibiotic Prophylaxis:   Anti-infectives (From admission, onward)    None      Indication(s): None identified  Post-operative Assessment:  Post-procedure Vital Signs:  Pulse/HCG Rate: 8575 Temp: (!) 97.5 F (36.4 C) Resp: 16 BP: 139/87 SpO2: 99 %  EBL: None  Complications: No immediate post-treatment complications observed by team, or reported by patient.  Note: The patient tolerated the entire procedure well. A repeat set of vitals were taken after the procedure and the patient was kept under observation following institutional policy, for this type of procedure. Post-procedural neurological assessment was performed, showing return to baseline, prior to discharge. The patient was provided with post-procedure discharge instructions, including a section on how to identify potential problems. Should any problems arise concerning this procedure, the patient was given instructions to immediately contact us , at any time, without hesitation. In any case, we plan to contact the patient by telephone for a follow-up status report regarding this interventional procedure.  Comments:  No additional relevant information.  Plan of Care (POC)   Medications ordered for procedure: Meds ordered this encounter  Medications   lidocaine   (XYLOCAINE ) 2 % (with pres) injection 400 mg   ropivacaine  (PF) 2 mg/mL (0.2%) (NAROPIN ) injection 18 mL   dexamethasone  (DECADRON ) injection 20 mg   methylPREDNISolone  acetate (DEPO-MEDROL ) injection 40 mg   dexamethasone  (DECADRON ) injection 10 mg   Medications administered: We administered lidocaine , ropivacaine  (PF) 2 mg/mL (0.2%), dexamethasone , methylPREDNISolone  acetate, and dexamethasone .  See the medical record for exact dosing, route, and time of administration.   Follow-up plan:   Return in about 8 weeks (around 05/03/2024) for PPE F2F.     Recent Visits Date Type Provider Dept  02/02/24 Procedure visit Marcelino Nurse, MD [facility]  Showing recent visits within past 90 days and meeting all other requirements Today's Visits Date Type Provider Dept  03/08/24 Procedure visit Marcelino Nurse, MD [facility]  Showing today's visits and meeting all other requirements Future Appointments Date Type Provider Dept  05/04/24 Appointment Marcelino Nurse, MD [facility]  Showing future appointments within next 90 days and meeting all other requirements   Disposition: Discharge home  Discharge (Date  Time): 03/08/2024; 1325 hrs.   Primary Care Physician: Anitra Arlean FALCON, MD Location: [facility] Note by: Nurse Marcelino, MD (TTS technology used. I apologize for any typographical errors that were not detected and corrected.) Date: 03/08/2024; Time: 3:45 PM  Disclaimer:  Medicine is not an visual merchandiser. The only guarantee in medicine is that nothing is guaranteed. It is important to note that the decision to proceed with this intervention was based on the information collected from the patient. The Data and conclusions were drawn from the patient's questionnaire, the interview, and the physical examination. Because the information was provided in large part by the patient, it cannot be guaranteed that it has not been purposely or  unconsciously manipulated. Every effort has been made to obtain as much relevant data as possible for this  evaluation. It is important to note that the conclusions that lead to this procedure are derived in large part from the available data. Always take into account that the treatment will also be dependent on availability of resources and existing treatment guidelines, considered by other Pain Management Practitioners as being common knowledge and practice, at the time of the intervention. For Medico-Legal purposes, it is also important to point out that variation in procedural techniques and pharmacological choices are the acceptable norm. The indications, contraindications, technique, and results of the above procedure should only be interpreted and judged by a Board-Certified Interventional Pain Specialist with extensive familiarity and expertise in the same exact procedure and technique.

## 2024-03-08 NOTE — Progress Notes (Signed)
 PROVIDER NOTE: Interpretation of information contained herein should be left to medically-trained personnel. Specific patient instructions are provided elsewhere under Patient Instructions section of medical record. This document was created in part using STT-dictation technology, any transcriptional errors that may result from this process are unintentional.  Patient: Jessica Haas Type: Established DOB: 1950-05-01 MRN: [id number] PCP: Anitra Arlean FALCON, MD  Service: Procedure DOS: 03/08/2024 Setting: Ambulatory Location: Ambulatory outpatient facility Delivery: Face-to-face Provider: Wallie Sherry, MD Specialty: Interventional Pain Management Specialty designation: 09 Location: Outpatient facility Ref. Prov.: Anitra Arlean FALCON, MD       Interventional Therapy   Procedure: Lumbar Facet, Medial Branch Radiofrequency Ablation (RFA) #2  Laterality: Bilateral (-50)  Level: L3, L4, and L5 Medial Branch Level(s). These levels will denervate the L3-4 and L4-5 lumbar facet joints.  Imaging: Fluoroscopy-guided         Anesthesia: Local anesthesia (1-2% Lidocaine ) Sedation:                         DOS: 03/08/2024  Performed by: Wallie Sherry, MD  Purpose: Therapeutic/Palliative Indications: Low back pain severe enough to impact quality of life or function. Indications: 1. Lumbar facet arthropathy   2. Lumbar spondylosis   3. Arthritis of carpometacarpal (CMC) joint of both thumbs   4. Bilateral primary osteoarthritis of knee    Jessica Haas has been dealing with the above chronic pain for longer than three months and has either failed to respond, was unable to tolerate, or simply did not get enough benefit from other more conservative therapies including, but not limited to: 1. Over-the-counter medications 2. Anti-inflammatory medications 3. Muscle relaxants 4. Membrane stabilizers 5. Opioids 6. Physical therapy and/or chiropractic manipulation 7. Modalities (Heat, ice, etc.) 8. Invasive  techniques such as nerve blocks. Jessica Haas has attained more than 50% relief of the pain from a series of diagnostic injections conducted in separate occasions.  Pain Score: Pre-procedure: 5 /10 Post-procedure: 3 /10     Position / Prep / Materials:  Position: Prone  Prep solution: ChloraPrep (2% chlorhexidine gluconate and 70% isopropyl alcohol) Prep Area: Entire Lumbosacral Region (Lower back from mid-thoracic region to end of tailbone and from flank to flank.) Materials:  Tray: RFA (Radiofrequency) tray Needle(s):  Type: RFA (Teflon-coated radiofrequency ablation needles) Gauge (G): 22  Length: Regular (10cm) Qty: 3     H&P (Pre-op Assessment):  Jessica Haas is a 73 y.o. (year old), female patient, seen today for interventional treatment. She  has a past surgical history that includes Abdominal hysterectomy; Carpal tunnel release; Foot Fusion (Left); Cataract extraction w/PHACO (Left, 12/19/2015); Cataract extraction w/PHACO (Right, 02/13/2016); Cervical fusion; and Bone excision (Right, 06/04/2018). Jessica Haas has a current medication list which includes the following prescription(s): acetaminophen , alprazolam , benzonatate , celecoxib , ciclopirox , estradiol, fluconazole , fluoxetine , hydrochlorothiazide, mupirocin  ointment, pitavastatin calcium, soolantra , tizanidine , trazodone, altreno , tretinoin , and zolpidem . Her primarily concern today is the Back Pain (Lower back; right knee; both thumbs)  Initial Vital Signs:  Pulse/HCG Rate: 85ECG Heart Rate: 84 Temp: (!) 97.5 F (36.4 C) Resp: 17 BP: 109/67 SpO2: 98 %  BMI: Estimated body mass index is 23.13 kg/m as calculated from the following:   Height as of this encounter: 5' 5 (1.651 m).   Weight as of this encounter: 139 lb (63 kg).  Risk Assessment: Allergies: Reviewed. She is allergic to doxycycline, erythromycin, lidocaine , losartan, rosuvastatin, tetracyclines & related, and thimerosal (thiomersal).  Allergy Precautions:  None required Coagulopathies: Reviewed. None identified.  Blood-thinner  therapy: None at this time Active Infection(s): Reviewed. None identified. Jessica Haas is afebrile  Site Confirmation: Jessica Haas was asked to confirm the procedure and laterality before marking the site Procedure checklist: Completed Consent: Before the procedure and under the influence of no sedative(s), amnesic(s), or anxiolytics, the patient was informed of the treatment options, risks and possible complications. To fulfill our ethical and legal obligations, as recommended by the American Medical Association's Code of Ethics, I have informed the patient of my clinical impression; the nature and purpose of the treatment or procedure; the risks, benefits, and possible complications of the intervention; the alternatives, including doing nothing; the risk(s) and benefit(s) of the alternative treatment(s) or procedure(s); and the risk(s) and benefit(s) of doing nothing. The patient was provided information about the general risks and possible complications associated with the procedure. These may include, but are not limited to: failure to achieve desired goals, infection, bleeding, organ or nerve damage, allergic reactions, paralysis, and death. In addition, the patient was informed of those risks and complications associated to Spine-related procedures, such as failure to decrease pain; infection (i.e.: Meningitis, epidural or intraspinal abscess); bleeding (i.e.: epidural hematoma, subarachnoid hemorrhage, or any other type of intraspinal or peri-dural bleeding); organ or nerve damage (i.e.: Any type of peripheral nerve, nerve root, or spinal cord injury) with subsequent damage to sensory, motor, and/or autonomic systems, resulting in permanent pain, numbness, and/or weakness of one or several areas of the body; allergic reactions; (i.e.: anaphylactic reaction); and/or death. Furthermore, the patient was informed of those risks  and complications associated with the medications. These include, but are not limited to: allergic reactions (i.e.: anaphylactic or anaphylactoid reaction(s)); adrenal axis suppression; blood sugar elevation that in diabetics may result in ketoacidosis or comma; water retention that in patients with history of congestive heart failure may result in shortness of breath, pulmonary edema, and decompensation with resultant heart failure; weight gain; swelling or edema; medication-induced neural toxicity; particulate matter embolism and blood vessel occlusion with resultant organ, and/or nervous system infarction; and/or aseptic necrosis of one or more joints. Finally, the patient was informed that Medicine is not an exact science; therefore, there is also the possibility of unforeseen or unpredictable risks and/or possible complications that may result in a catastrophic outcome. The patient indicated having understood very clearly. We have given the patient no guarantees and we have made no promises. Enough time was given to the patient to ask questions, all of which were answered to the patient's satisfaction. Jessica Haas has indicated that she wanted to continue with the procedure. Attestation: I, the ordering provider, attest that I have discussed with the patient the benefits, risks, side-effects, alternatives, likelihood of achieving goals, and potential problems during recovery for the procedure that I have provided informed consent. Date  Time: 03/08/2024 11:15 AM  Pre-Procedure Preparation:  Monitoring: As per clinic protocol. Respiration, ETCO2, SpO2, BP, heart rate and rhythm monitor placed and checked for adequate function Safety Precautions: Patient was assessed for positional comfort and pressure points before starting the procedure. Time-out: I initiated and conducted the Time-out before starting the procedure, as per protocol. The patient was asked to participate by confirming the accuracy of  the Time Out information. Verification of the correct person, site, and procedure were performed and confirmed by me, the nursing staff, and the patient. Time-out conducted as per Joint Commission's Universal Protocol (UP.01.01.01). Time: 1245 Start Time: 1245 hrs.  Description of Procedure:          Laterality:  See above. Levels:  See above. Safety Precautions: Aspiration looking for blood return was conducted prior to all injections. At no point did we inject any substances, as a needle was being advanced. Before injecting, the patient was told to immediately notify me if she was experiencing any new onset of ringing in the ears, or metallic taste in the mouth. No attempts were made at seeking any paresthesias. Safe injection practices and needle disposal techniques used. Medications properly checked for expiration dates. SDV (single dose vial) medications used. After the completion of the procedure, all disposable equipment used was discarded in the proper designated medical waste containers. Local Anesthesia: Protocol guidelines were followed. The patient was positioned over the fluoroscopy table. The area was prepped in the usual manner. The time-out was completed. The target area was identified using fluoroscopy. A 12-in long, straight, sterile hemostat was used with fluoroscopic guidance to locate the targets for each level blocked. Once located, the skin was marked with an approved surgical skin marker. Once all sites were marked, the skin (epidermis, dermis, and hypodermis), as well as deeper tissues (fat, connective tissue and muscle) were infiltrated with a small amount of a short-acting local anesthetic, loaded on a 10cc syringe with a 25G, 1.5-in  Needle. An appropriate amount of time was allowed for local anesthetics to take effect before proceeding to the next step. Technical description of process:  Radiofrequency Ablation (RFA)  L3 Medial Branch Nerve RFA: The target area for the  L3 medial branch is at the junction of the postero-lateral aspect of the superior articular process and the superior, posterior, and medial edge of the transverse process of L4. Under fluoroscopic guidance, a Radiofrequency needle was inserted until contact was made with os over the superior postero-lateral aspect of the pedicular shadow (target area). Sensory and motor testing was conducted to properly adjust the position of the needle. Once satisfactory placement of the needle was achieved, the numbing solution was slowly injected after negative aspiration for blood. 2.0 mL of the nerve block solution was injected without difficulty or complication. After waiting for at least 3 minutes, the ablation was performed. Once completed, the needle was removed intact. L4 Medial Branch Nerve RFA: The target area for the L4 medial branch is at the junction of the postero-lateral aspect of the superior articular process and the superior, posterior, and medial edge of the transverse process of L5. Under fluoroscopic guidance, a Radiofrequency needle was inserted until contact was made with os over the superior postero-lateral aspect of the pedicular shadow (target area). Sensory and motor testing was conducted to properly adjust the position of the needle. Once satisfactory placement of the needle was achieved, the numbing solution was slowly injected after negative aspiration for blood. 2.0 mL of the nerve block solution was injected without difficulty or complication. After waiting for at least 3 minutes, the ablation was performed. Once completed, the needle was removed intact. L5 Medial Branch Nerve RFA: The target area for the L5 medial branch is at the junction of the postero-lateral aspect of the superior articular process of S1 and the superior, posterior, and medial edge of the sacral ala. Under fluoroscopic guidance, a Radiofrequency needle was inserted until contact was made with os over the superior  postero-lateral aspect of the pedicular shadow (target area). Sensory and motor testing was conducted to properly adjust the position of the needle. Once satisfactory placement of the needle was achieved, the numbing solution was slowly injected after negative aspiration for blood. 2.0 mL  of the nerve block solution was injected without difficulty or complication. After waiting for at least 3 minutes, the ablation was performed. Once completed, the needle was removed intact.  Radiofrequency lesioning (ablation):  Radiofrequency Generator: Medtronic AccurianTM AG 1000 RF Generator Sensory Stimulation Parameters: 50 Hz was used to locate & identify the nerve, making sure that the needle was positioned such that there was no sensory stimulation below 0.3 V or above 0.7 V. Motor Stimulation Parameters: 2 Hz was used to evaluate the motor component. Care was taken not to lesion any nerves that demonstrated motor stimulation of the lower extremities at an output of less than 2.5 times that of the sensory threshold, or a maximum of 2.0 V. Lesioning Technique Parameters: Standard Radiofrequency settings. (Not bipolar or pulsed.) Temperature Settings: 80 degrees C Lesioning time: 60 seconds Stationary intra-operative compliance: Compliant  Once the entire procedure was completed, the treated area was cleaned, making sure to leave some of the prepping solution back to take advantage of its long term bactericidal properties.    Illustration of the posterior view of the lumbar spine and the posterior neural structures. Laminae of L2 through S1 are labeled. DPRL5, dorsal primary ramus of L5; DPRS1, dorsal primary ramus of S1; DPR3, dorsal primary ramus of L3; FJ, facet (zygapophyseal) joint L3-L4; I, inferior articular process of L4; LB1, lateral branch of dorsal primary ramus of L1; IAB, inferior articular branches from L3 medial branch (supplies L4-L5 facet joint); IBP, intermediate branch plexus; MB3, medial  branch of dorsal primary ramus of L3; NR3, third lumbar nerve root; S, superior articular process of L5; SAB, superior articular branches from L4 (supplies L4-5 facet joint also); TP3, transverse process of L3.  Facet Joint Innervation (* possible contribution)  L1-2 T12, L1 (L2*)  Medial Branch  L2-3 L1, L2 (L3*)                     L3-4 L2, L3 (L4*)                     L4-5 L3, L4 (L5*)                     L5-S1 L4, L5, S1                        Vitals:   03/08/24 1301 03/08/24 1306 03/08/24 1319 03/08/24 1320  BP: 137/89 (!) 135/91 (!) 142/98 139/87  Pulse:      Resp: 19 13 16    Temp:      SpO2: 98% 100% 99%   Weight:      Height:        Start Time: 1245 hrs. End Time: 1314 hrs.  Imaging Guidance (Spinal):          Type of Imaging Technique: Fluoroscopy Guidance (Spinal) Indication(s): Fluoroscopy guidance for needle placement to enhance accuracy in procedures requiring precise needle localization for targeted delivery of medication in or near specific anatomical locations not easily accessible without such real-time imaging assistance. Exposure Time: Please see nurses notes. Contrast: None used. Fluoroscopic Guidance: I was personally present during the use of fluoroscopy. Tunnel Vision Technique used to obtain the best possible view of the target area. Parallax error corrected before commencing the procedure. Direction-depth-direction technique used to introduce the needle under continuous pulsed fluoroscopy. Once target was reached, antero-posterior, oblique, and lateral fluoroscopic projection used confirm needle placement in all planes. Images permanently stored in  EMR. Interpretation: No contrast injected. I personally interpreted the imaging intraoperatively. Adequate needle placement confirmed in multiple planes. Permanent images saved into the patient's record.  Antibiotic Prophylaxis:   Anti-infectives (From admission, onward)    None       Indication(s): None identified   Post-operative Assessment:  Post-procedure Vital Signs:  Pulse/HCG Rate: 8575 Temp: (!) 97.5 F (36.4 C) Resp: 16 BP: 139/87 SpO2: 99 %  EBL: None  Complications: No immediate post-treatment complications observed by team, or reported by patient.  Note: The patient tolerated the entire procedure well. A repeat set of vitals were taken after the procedure and the patient was kept under observation following institutional policy, for this type of procedure. Post-procedural neurological assessment was performed, showing return to baseline, prior to discharge. The patient was provided with post-procedure discharge instructions, including a section on how to identify potential problems. Should any problems arise concerning this procedure, the patient was given instructions to immediately contact us , at any time, without hesitation. In any case, we plan to contact the patient by telephone for a follow-up status report regarding this interventional procedure.  Comments:  No additional relevant information.  Plan of Care (POC)  Orders:  Orders Placed This Encounter  Procedures   DG PAIN [facility] C-ARM 1-60 MIN NO REPORT    Intraoperative interpretation by procedural physician at [facility].    Standing Status:   Standing    Number of Occurrences:   1    Reason for exam::   Assistance in needle guidance and placement for procedures requiring needle placement in or near specific anatomical locations not easily accessible without such assistance.   Medications ordered for procedure: Meds ordered this encounter  Medications   lidocaine  (XYLOCAINE ) 2 % (with pres) injection 400 mg   ropivacaine  (PF) 2 mg/mL (0.2%) (NAROPIN ) injection 18 mL   dexamethasone  (DECADRON ) injection 20 mg   methylPREDNISolone  acetate (DEPO-MEDROL ) injection 40 mg   dexamethasone  (DECADRON ) injection 10 mg   Medications administered: We administered lidocaine ,  ropivacaine  (PF) 2 mg/mL (0.2%), dexamethasone , methylPREDNISolone  acetate, and dexamethasone .  See the medical record for exact dosing, route, and time of administration.  Follow-up plan:   Return in about 8 weeks (around 05/03/2024) for PPE F2F.       BLF L3,4,5 05/26/23, #2 07/02/23; B/L L3,4,5 RFA 09/03/23, 03/08/24     Recent Visits Date Type Provider Dept  02/02/24 Procedure visit Marcelino Nurse, MD [facility]  Showing recent visits within past 90 days and meeting all other requirements Today's Visits Date Type Provider Dept  03/08/24 Procedure visit Marcelino Nurse, MD [facility]  Showing today's visits and meeting all other requirements Future Appointments Date Type Provider Dept  05/04/24 Appointment Marcelino Nurse, MD [facility]  Showing future appointments within next 90 days and meeting all other requirements  Disposition: Discharge home  Discharge (Date  Time): 03/08/2024; 1325 hrs.   Primary Care Physician: Anitra Arlean FALCON, MD Location: [facility] Note by: Nurse Marcelino, MD (TTS technology used. I apologize for any typographical errors that were not detected and corrected.) Date: 03/08/2024; Time: 3:44 PM  Disclaimer:  Medicine is not an visual merchandiser. The only guarantee in medicine is that nothing is guaranteed. It is important to note that the decision to proceed with this intervention was based on the information collected from the patient. The Data and conclusions were drawn from the patient's questionnaire, the interview, and the physical examination. Because the information was  provided in large part by the patient, it cannot be guaranteed that it has not been purposely or unconsciously manipulated. Every effort has been made to obtain as much relevant data as possible for this evaluation. It is important to note that the conclusions that lead to this procedure are derived in large part from the available data.  Always take into account that the treatment will also be dependent on availability of resources and existing treatment guidelines, considered by other Pain Management Practitioners as being common knowledge and practice, at the time of the intervention. For Medico-Legal purposes, it is also important to point out that variation in procedural techniques and pharmacological choices are the acceptable norm. The indications, contraindications, technique, and results of the above procedure should only be interpreted and judged by a Board-Certified Interventional Pain Specialist with extensive familiarity and expertise in the same exact procedure and technique.

## 2024-03-08 NOTE — Patient Instructions (Signed)

## 2024-03-08 NOTE — Progress Notes (Signed)
 Safety precautions to be maintained throughout the outpatient stay will include: orient to surroundings, keep bed in low position, maintain call bell within reach at all times, provide assistance with transfer out of bed and ambulation.

## 2024-03-08 NOTE — Progress Notes (Signed)
 PROVIDER NOTE: Interpretation of information contained herein should be left to medically-trained personnel. Specific patient instructions are provided elsewhere under Patient Instructions section of medical record. This document was created in part using STT-dictation technology, any transcriptional errors that may result from this process are unintentional.  Patient: Jessica Haas Type: Established DOB: 10-20-1950 MRN: [id number] PCP: Anitra Arlean FALCON, MD  Service: Procedure DOS: 03/08/2024 Setting: Ambulatory Location: Ambulatory outpatient facility Delivery: Face-to-face Provider: Wallie Sherry, MD Specialty: Interventional Pain Management Specialty designation: 09 Location: Outpatient facility Ref. Prov.: Anitra Arlean FALCON, MD       Interventional Therapy   Type:  Steroid Intra-articular Knee Injection #2  Laterality: Right (-RT) Level/approach: Medial Imaging guidance: None required ([id number]) Anesthesia: Local anesthesia (1-2% Lidocaine ) Sedation:                         DOS: 03/08/2024  Performed by: Wallie Sherry, MD  Purpose: Therapeutic Indications: Knee arthralgia associated to osteoarthritis of the knee  NAS-11 score:   Pre-procedure: 5 /10   Post-procedure: 3 /10     Pre-Procedure Preparation  Monitoring: As per clinic protocol.  Risk Assessment: Vitals:  AFP:Zdupfjuzi body mass index is 23.13 kg/m as calculated from the following:   Height as of this encounter: 5' 5 (1.651 m).   Weight as of this encounter: 139 lb (63 kg)., Rate:85ECG Heart Rate: 84, BP:109/67, Resp:17, Temp:(!) 97.5 F (36.4 C), SpO2:98 %  Allergies: She is allergic to doxycycline, erythromycin, lidocaine , losartan, rosuvastatin, tetracyclines & related, and thimerosal (thiomersal).  Precautions: No additional precautions required  Blood-thinner(s): None at this time  Coagulopathies: Reviewed. None identified.   Active Infection(s): Reviewed. None identified. Jessica Haas is afebrile    Location setting: Exam room Position: Sitting w/ knee bent 90 degrees Safety Precautions: Patient was assessed for positional comfort and pressure points before starting the procedure. Prepping solution: DuraPrep (Iodine  Povacrylex [0.7% available iodine ] and Isopropyl Alcohol, 74% w/w) Prep Area: Entire knee region Approach: percutaneous, just above the tibial plateau, lateral to the infrapatellar tendon. Intended target: Intra-articular knee space Materials: Tray: Block Needle(s): Regular Qty: 1/side Length: 1.5-inch Gauge: 25G (x1) + 22G (x1)  Meds ordered this encounter  Medications   lidocaine  (XYLOCAINE ) 2 % (with pres) injection 400 mg   ropivacaine  (PF) 2 mg/mL (0.2%) (NAROPIN ) injection 18 mL   dexamethasone  (DECADRON ) injection 20 mg   methylPREDNISolone  acetate (DEPO-MEDROL ) injection 40 mg   dexamethasone  (DECADRON ) injection 10 mg    Orders Placed This Encounter  Procedures   DG PAIN CLINIC C-ARM 1-60 MIN NO REPORT    Intraoperative interpretation by procedural physician at [facility].    Standing Status:   Standing    Number of Occurrences:   1    Reason for exam::   Assistance in needle guidance and placement for procedures requiring needle placement in or near specific anatomical locations not easily accessible without such assistance.     Time-out: 1245 I initiated and conducted the Time-out before starting the procedure, as per protocol. The patient was asked to participate by confirming the accuracy of the Time Out information. Verification of the correct person, site, and procedure were performed and confirmed by me, the nursing staff, and the patient. Time-out conducted as per Joint Commission's Universal Protocol (UP.01.01.01). Procedure checklist: Completed  H&P (Pre-op  Assessment)  Jessica Haas is a 73 y.o. (year old), female patient, seen today for interventional treatment. She  has a past surgical history that includes  Abdominal  hysterectomy; Carpal tunnel release; Foot Fusion (Left); Cataract extraction w/PHACO (Left, 12/19/2015); Cataract extraction w/PHACO (Right, 02/13/2016); Cervical fusion; and Bone excision (Right, 06/04/2018). Jessica Haas has a current medication list which includes the following prescription(s): acetaminophen , alprazolam , benzonatate , celecoxib , ciclopirox , estradiol, fluconazole , fluoxetine , hydrochlorothiazide, mupirocin  ointment, pitavastatin calcium, soolantra , tizanidine , trazodone, altreno , tretinoin , and zolpidem . Her primarily concern today is the Back Pain (Lower back; right knee; both thumbs)  She is allergic to doxycycline, erythromycin, lidocaine , losartan, rosuvastatin, tetracyclines & related, and thimerosal (thiomersal).   Last encounter: My last encounter with her was on 03/01/2024. Pertinent problems: Jessica Haas does not have any pertinent problems on file. Pain Assessment: Severity of Chronic pain is reported as a 5 /10. Location: Back Lower/radiates down left leg to shin and foot. Onset: More than a month ago. Quality: Numbness. Timing: Constant. Modifying factor(s): denies. Vitals:  height is 5' 5 (1.651 m) and weight is 139 lb (63 kg). Her temperature is 97.5 F (36.4 C) (abnormal). Her blood pressure is 139/87 and her pulse is 85. Her respiration is 16 and oxygen  saturation is 99%.   Reason for encounter: interventional pain management therapy due pain of at least four (4) weeks in duration, with failure to respond and/or inability to tolerate more conservative care.  Site Confirmation: Jessica Haas was asked to confirm the procedure and laterality before marking the site.  Consent: Before the procedure and under the influence of no sedative(s), amnesic(s), or anxiolytics, the patient was informed of the treatment options, risks and possible complications. To fulfill our ethical and legal obligations, as recommended by the American Medical Association's Code of Ethics, I have  informed the patient of my clinical impression; the nature and purpose of the treatment or procedure; the risks, benefits, and possible complications of the intervention; the alternatives, including doing nothing; the risk(s) and benefit(s) of the alternative treatment(s) or procedure(s); and the risk(s) and benefit(s) of doing nothing. The patient was provided information about the general risks and possible complications associated with the procedure. These may include, but are not limited to: failure to achieve desired goals, infection, bleeding, organ or nerve damage, allergic reactions, paralysis, and death. In addition, the patient was informed of those risks and complications associated to Spine-related procedures, such as failure to decrease pain; infection (i.e.: Meningitis, epidural or intraspinal abscess); bleeding (i.e.: epidural hematoma, subarachnoid hemorrhage, or any other type of intraspinal or peri-dural bleeding); organ or nerve damage (i.e.: Any type of peripheral nerve, nerve root, or spinal cord injury) with subsequent damage to sensory, motor, and/or autonomic systems, resulting in permanent pain, numbness, and/or weakness of one or several areas of the body; allergic reactions; (i.e.: anaphylactic reaction); and/or death. Furthermore, the patient was informed of those risks and complications associated with the medications. These include, but are not limited to: allergic reactions (i.e.: anaphylactic or anaphylactoid reaction(s)); adrenal axis suppression; blood sugar elevation that in diabetics may result in ketoacidosis or comma; water retention that in patients with history of congestive heart failure may result in shortness of breath, pulmonary edema, and decompensation with resultant heart failure; weight gain; swelling or edema; medication-induced neural toxicity; particulate matter embolism and blood vessel occlusion with resultant organ, and/or nervous system infarction; and/or  aseptic necrosis of one or more joints. Finally, the patient was informed that Medicine is not an exact science; therefore, there is also the possibility of unforeseen or unpredictable risks and/or possible complications that may result in a catastrophic outcome. The patient indicated having understood very clearly.  We have given the patient no guarantees and we have made no promises. Enough time was given to the patient to ask questions, all of which were answered to the patient's satisfaction. Jessica Haas has indicated that she wanted to continue with the procedure. Attestation: I, the ordering provider, attest that I have discussed with the patient the benefits, risks, side-effects, alternatives, likelihood of achieving goals, and potential problems during recovery for the procedure that I have provided informed consent.  Date  Time: 03/08/2024 11:15 AM  Description of procedure  Start Time: 1245 hrs  Local Anesthesia: Once the patient was positioned, prepped, and time-out was completed. The target area was identified located. The skin was marked with an approved surgical skin marker. Once marked, the skin (epidermis, dermis, and hypodermis), and deeper tissues (fat, connective tissue and muscle) were infiltrated with a small amount of a short-acting local anesthetic, loaded on a 10cc syringe with a 25G, 1.5-in  Needle. An appropriate amount of time was allowed for local anesthetics to take effect before proceeding to the next step. Local Anesthetic: Lidocaine  1-2% The unused portion of the local anesthetic was discarded in the proper designated containers. Safety Precautions: Aspiration looking for blood return was conducted prior to all injections. At no point did I inject any substances, as a needle was being advanced. Before injecting, the patient was told to immediately notify me if she was experiencing any new onset of ringing in the ears, or metallic taste in the mouth. No attempts were made  at seeking any paresthesias. Safe injection practices and needle disposal techniques used. Medications properly checked for expiration dates. SDV (single dose vial) medications used. After the completion of the procedure, all disposable equipment used was discarded in the proper designated medical waste containers.  Technical description: Protocol guidelines were followed. After positioning, the target area was identified and prepped in the usual manner. Skin & deeper tissues infiltrated with local anesthetic. Appropriate amount of time allowed to pass for local anesthetics to take effect. Proper needle placement secured. Once satisfactory needle placement was confirmed, I proceeded to inject the desired solution in slow, incremental fashion, intermittently assessing for discomfort or any signs of abnormal or undesired spread of substance. Once completed, the needle was removed and disposed of, as per hospital protocols. The area was cleaned, making sure to leave some of the prepping solution back to take advantage of its long term bactericidal properties.  5 cc solution made of 4 cc of 2% lidocaine , 1 cc of methylprednisolone , 40 mg/cc.  Injected into the right knee joint.  Aspiration:  Negative        Vitals:   03/08/24 1301 03/08/24 1306 03/08/24 1319 03/08/24 1320  BP: 137/89 (!) 135/91 (!) 142/98 139/87  Pulse:      Resp: 19 13 16    Temp:      SpO2: 98% 100% 99%   Weight:      Height:        End Time: 1314 hrs    Post-op assessment  Post-procedure Vital Signs:  Pulse/HCG Rate: 8575 Temp: (!) 97.5 F (36.4 C) Resp: 16 BP: 139/87 SpO2: 99 %  EBL: None  Complications: No immediate post-treatment complications observed by team, or reported by patient.  Note: The patient tolerated the entire procedure well. A repeat set of vitals were taken after the procedure and the patient was kept under observation following institutional policy, for this type of procedure.  Post-procedural neurological assessment was performed, showing return to baseline, prior to  discharge. The patient was provided with post-procedure discharge instructions, including a section on how to identify potential problems. Should any problems arise concerning this procedure, the patient was given instructions to immediately contact us , at any time, without hesitation. In any case, we plan to contact the patient by telephone for a follow-up status report regarding this interventional procedure.  Comments:  No additional relevant information.  Plan of care   Medications administered: We administered lidocaine , ropivacaine  (PF) 2 mg/mL (0.2%), dexamethasone , methylPREDNISolone  acetate, and dexamethasone .  Follow-up plan:   Return in about 8 weeks (around 05/03/2024) for PPE F2F.       Recent Visits Date Type Provider Dept  02/02/24 Procedure visit Marcelino Nurse, MD [facility]  Showing recent visits within past 90 days and meeting all other requirements Today's Visits Date Type Provider Dept  03/08/24 Procedure visit Marcelino Nurse, MD [facility]  Showing today's visits and meeting all other requirements Future Appointments Date Type Provider Dept  05/04/24 Appointment Marcelino Nurse, MD [facility]  Showing future appointments within next 90 days and meeting all other requirements   Disposition: Discharge home  Discharge (Date  Time): 03/08/2024; 1325 hrs.   Primary Care Physician: Anitra Arlean FALCON, MD Location: [facility] Note by: Nurse Marcelino, MD Date: 03/08/2024; Time: 3:45 PM  DISCLAIMER: Medicine is not an exact science. It has no guarantees or warranties. The decision to proceed with this intervention was based on the information collected from the patient. Conclusions were drawn from the patient's questionnaire, interview, and examination. Because information was provided in large part by the patient, it cannot  be guaranteed that it has not been purposely or unconsciously manipulated or altered. Every effort has been made to obtain as much accurate, relevant, available data as possible. Always take into account that the treatment will also be dependent on availability of resources and existing treatment guidelines, considered by other Pain Management Specialists as being common knowledge and practice, at the time of the intervention. It is also important to point out that variation in procedural techniques and pharmacological choices are the acceptable norm. For Medico-Legal review purposes, the indications, contraindications, technique, and results of the these procedures should only be evaluated, judged and interpreted by a Board-Certified Interventional Pain Specialist with extensive familiarity and expertise in the same exact procedure and technique.

## 2024-03-09 ENCOUNTER — Ambulatory Visit

## 2024-03-09 ENCOUNTER — Telehealth: Payer: Self-pay

## 2024-03-09 DIAGNOSIS — L820 Inflamed seborrheic keratosis: Secondary | ICD-10-CM

## 2024-03-09 NOTE — Telephone Encounter (Signed)
 Post procedure follow up.  Patient states she is doing good.

## 2024-03-09 NOTE — Progress Notes (Signed)
    Subjective   Jessica Haas is a 73 y.o. female who presents for the following: Lesion(s) of concern . Patient is established patient   Today patient reports: Irritated skin lesion on the R neck  Review of Systems:    No other skin or systemic complaints except as noted in HPI or Assessment and Plan.  The following portions of the chart were reviewed this encounter and updated as appropriate: medications, allergies, medical history  Relevant Medical History:  n/a   Objective  (SKPE) Well appearing patient in no apparent distress; mood and affect are within normal limits. Examination was performed of the: Focused Exam of: the face and neck   Examination notable for: Seborrheic Keratosis(es): Stuck-on appearing keratotic papule(s) on the trunk, some  irritated with redness, crusting, edema, and/or partial avulsion  Examination limited by: n/a   R neck x 1 Erythematous stuck-on, waxy papule or plaque  Assessment & Plan  (SKAP)       Level of service outlined above   Patient instructions (SKPI)   Procedures, orders, diagnosis for this visit:  INFLAMED SEBORRHEIC KERATOSIS R neck x 1 Symptomatic, irritating, patient would like treated.  Destruction of lesion - R neck x 1 Complexity: simple   Destruction method: cryotherapy   Informed consent: discussed and consent obtained   Timeout:  patient name, date of birth, surgical site, and procedure verified Lesion destroyed using liquid nitrogen: Yes   Region frozen until ice ball extended beyond lesion: Yes   Cryo cycles: 1 or 2. Outcome: patient tolerated procedure well with no complications   Post-procedure details: wound care instructions given     Inflamed seborrheic keratosis -     Destruction of lesion    Return to clinic: Return for appointment as scheduled.  Jessica Haas, Jessica Haas, am acting as scribe for Lauraine JAYSON Kanaris, MD .  Documentation: I have reviewed the above documentation for accuracy and  completeness, and I agree with the above.  Lauraine JAYSON Kanaris, MD

## 2024-03-09 NOTE — Patient Instructions (Signed)

## 2024-04-26 ENCOUNTER — Ambulatory Visit: Admitting: Dermatology

## 2024-04-29 ENCOUNTER — Ambulatory Visit: Admitting: Student in an Organized Health Care Education/Training Program

## 2024-05-03 ENCOUNTER — Other Ambulatory Visit: Payer: Self-pay | Admitting: Dermatology

## 2024-05-04 ENCOUNTER — Encounter: Payer: Self-pay | Admitting: Student in an Organized Health Care Education/Training Program

## 2024-05-04 ENCOUNTER — Ambulatory Visit
Attending: Student in an Organized Health Care Education/Training Program | Admitting: Student in an Organized Health Care Education/Training Program

## 2024-05-04 VITALS — BP 116/73 | HR 86 | Temp 97.6°F | Resp 18 | Ht 64.0 in | Wt 139.0 lb

## 2024-05-04 DIAGNOSIS — M170 Bilateral primary osteoarthritis of knee: Secondary | ICD-10-CM | POA: Diagnosis not present

## 2024-05-04 DIAGNOSIS — M180 Bilateral primary osteoarthritis of first carpometacarpal joints: Secondary | ICD-10-CM | POA: Diagnosis not present

## 2024-05-04 NOTE — Patient Instructions (Signed)
 Knee Injection A knee injection is a procedure to get medicine in your knee joint to help relieve the pain, swelling, and stiffness that is caused by arthritis. The medicine may also cushion your knee joint. Your health care provider will use a needle to inject the medicine. You may need more than one injection. Tell a health care provider about: Any allergies you have. All medicines you take. These include vitamins, herbs, eye drops, and creams. Any problems you or family members have had with anesthesia. Any bleeding problems you have. Any surgeries you've had. Any medical conditions you have. Whether you're pregnant or may be pregnant. What are the risks? Your provider will talk with you about risks. These may include: Infection. Bleeding. Symptoms that get worse. Damage to the area around your knee. Allergic reaction to the medicines. Skin reactions from having many injections. What happens before the procedure? Ask about changing or stopping: Any medicines you take. Any vitamins, herbs, or supplements you take. Do not take aspirin or ibuprofen unless you're told to. Plan to have a responsible adult drive you home from the hospital or clinic. You won't be allowed to drive. What happens during the procedure?  You will sit or lie down in a position for your knee to be treated. The skin over your kneecap will be cleaned with a soap that kills germs. You will be given a medicine that numbs the area. You may feel some stinging. The medicine will be injected into your knee. The needle is placed between your kneecap and your knee. The medicine is injected into the joint space. The needle will be taken out. A bandage may be placed over the injection site. The procedure may vary among providers and hospitals. What can I expect after the procedure? Your blood pressure, heart rate, breathing rate, and blood oxygen level will be monitored until you leave the hospital or clinic. You may  have to move your knee through its full range of motion. This helps to get the medicine into the joint space. You will be watched to make sure that you do not have a reaction to the injection. You may feel more pain, swelling, and warmth than you did before the injection. This reaction may last about 1-2 days. Follow these instructions at home: Medicines Take over-the-counter and prescription medicines only as told by your provider. Ask your provider if the medicine prescribed to you requires you to avoid driving or using machinery. Do not take medicines such as aspirin and ibuprofen unless your provider tells you to. Injection site care Follow instructions from your provider about: How to take care of your injection site. When and how you should change your bandage. When you should take off your bandage. Check your injection area every day for signs of infection. Check for: More redness, swelling, or pain after 2 days. Fluid or blood. Warmth. Pus or a bad smell. Managing pain, stiffness, and swelling  Use ice or an ice pack as told. Place a towel between your skin and the ice. Leave the ice on for 20 minutes, 2-3 times a day. If your skin turns red, take off the ice right away to prevent skin damage. The risk of damage is higher if you can't feel pain, heat, or cold. Do not put heat to your knee. Raise your knee above the level of your heart while you're sitting or lying down. General instructions If you have a bandage, keep it dry until your provider says it can be taken  off. Ask your provider when you can start taking showers and baths. Avoid activities that take a lot of effort for as long as told by your provider. Ask what things are safe for you to do at home. Ask when you can go back to work or school. Keep all follow-up visits. You may need more injections. Contact a health care provider if: You have a fever. You have any signs of infection. Your symptoms at the injection  site last longer than 2 days after your procedure. Get help right away if: Your knee turns very red. Your knee becomes very swollen. You have very bad knee pain. This information is not intended to replace advice given to you by your health care provider. Make sure you discuss any questions you have with your health care provider. Document Revised: 08/06/2022 Document Reviewed: 08/06/2022 Elsevier Patient Education  2024 [employer].

## 2024-05-04 NOTE — Progress Notes (Signed)
 Safety precautions to be maintained throughout the outpatient stay will include: orient to surroundings, keep bed in low position, maintain call bell within reach at all times, provide assistance with transfer out of bed and ambulation.

## 2024-05-04 NOTE — Progress Notes (Signed)
 PROVIDER NOTE: Interpretation of information contained herein should be left to medically-trained personnel. Specific patient instructions are provided elsewhere under Patient Instructions section of medical record. This document was created in part using AI and STT-dictation technology, any transcriptional errors that may result from this process are unintentional.  Patient: Jessica Haas Pouch  Service: E/M   PCP: Anitra Arlean FALCON, MD  DOB: 03-10-1951  DOS: 05/04/2024  Provider: Wallie Sherry, MD  MRN: [id number]  Delivery: Face-to-face  Specialty: Interventional Pain Management  Type: Established Patient  Setting: Ambulatory outpatient facility  Specialty designation: 09  Referring Prov.: Anitra Arlean FALCON, MD  Location: Outpatient office facility       History of present illness (HPI) Ms. MARLINA CATALDI, a 74 y.o. year old female, is here today because of her Arthritis of carpometacarpal [facility]) joint of both thumbs [M18.0]. Ms. Melott primary complain today is Hand Pain, Knee Pain (Inner right), and Back Pain  Pertinent problems: Ms. Sawaya does not have any pertinent problems on file.  Pain Assessment: Severity of Chronic pain is reported as a 5 /10. Location: Hand Right, Left/left knee radiates to left leg and shin. Onset: More than a month ago. Quality: Discomfort. Timing: Constant. Modifying factor(s):  SABRA Vitals:  height is 5' 4 (1.626 m) and weight is 139 lb (63 kg). Her temperature is 97.6 F (36.4 C). Her blood pressure is 116/73 and her pulse is 86. Her respiration is 18 and oxygen  saturation is 98%.  BMI: Estimated body mass index is 23.86 kg/m as calculated from the following:   Height as of this encounter: 5' 4 (1.626 m).   Weight as of this encounter: 139 lb (63 kg).  Last encounter: 10/21/2023. Last procedure: 03/08/2024.  Reason for encounter:   History of Present Illness   Jessica Haas is a 74 year old female who presents for follow-up regarding her chronic pain  management.  She experiences chronic pain primarily in her back, right knee, and thumbs. Her last injection provided initial numbing but lacked long-lasting relief. Pain is persistent, with some improvement earlier in the day, but worsens with inadequate sleep. She averages about seven hours of sleep per night, though frequent awakenings affect her pain levels.  She received a steroid injection for her right knee and thumbs on November 17th, which provided temporary relief. She reports that her knee pain is worsening and that she needs to place her foot down first when descending stairs. She has a family history of knee issues, as her three younger brothers have all undergone knee replacements. She has also experienced issues with her midfoot, which was previously MRI'd due to a problem with her toes. She has seen a podiatrist and a physician assistant for these issues, and she has had left foot surgery in the past.  In terms of medication, she has never taken opioids, even after foot surgery, and occasionally uses Celebrex  for pain management. She is open to trying new non-opioid pain medications.         ROS  Constitutional: Denies any fever or chills Gastrointestinal: No reported hemesis, hematochezia, vomiting, or acute GI distress Musculoskeletal: Bilateral CMC pain, right knee pain Neurological: No reported episodes of acute onset apraxia, aphasia, dysarthria, agnosia, amnesia, paralysis, loss of coordination, or loss of consciousness  Medication Review  ALPRAZolam , FLUoxetine , Ivermectin , Pitavastatin Calcium, Tretinoin , acetaminophen , benzonatate , celecoxib , ciclopirox , estradiol, fluconazole , hydrochlorothiazide, mupirocin  ointment, tiZANidine , traZODone, tretinoin , and zolpidem   History Review  Allergy: Ms. Reggio is allergic to doxycycline, erythromycin, lidocaine ,  losartan, rosuvastatin, tetracyclines & related, and thimerosal (thiomersal). Drug: Ms. Losada  reports no history  of drug use. Alcohol:  reports no history of alcohol use. Tobacco:  reports that she quit smoking about 45 years ago. Her smoking use included cigarettes. She has quit using smokeless tobacco. Social: Ms. Widjaja  reports that she quit smoking about 45 years ago. Her smoking use included cigarettes. She has quit using smokeless tobacco. She reports that she does not drink alcohol and does not use drugs. Medical:  has a past medical history of Actinic keratosis (11/29/2014), Anemia (2008), Anxiety, Arthritis, Basal cell carcinoma of hand, Depression, GERD (gastroesophageal reflux disease), H/O abnormal mammogram (2008), H/O gastric ulcer (2008), High altitude sickness (2008), History of hiatal hernia, Neuromuscular disorder (HCC), Skin cancer (03/19/2016), Skin cancer (09/15/2018), Squamous cell carcinoma of skin (03/19/2016), Squamous cell carcinoma of skin (09/15/2018), Squamous cell carcinoma of skin (04/03/2021), Swelling, and Varicose vein of leg. Surgical: Ms. Stierwalt  has a past surgical history that includes Abdominal hysterectomy; Carpal tunnel release; Foot Fusion (Left); Cataract extraction w/PHACO (Left, 12/19/2015); Cataract extraction w/PHACO (Right, 02/13/2016); Cervical fusion; and Bone excision (Right, 06/04/2018). Family: family history includes COPD in her father; COPD (age of onset: 62) in her maternal grandmother; Cancer (age of onset: 73) in her mother; Heart disease (age of onset: 40) in her father.  Laboratory Chemistry Profile   Renal Lab Results  Component Value Date   BUN 19 04/27/2012   CREATININE 0.9 04/30/2018   GFR 79.26 04/27/2012    Hepatic Lab Results  Component Value Date   AST 29 04/30/2018   ALT 16 04/30/2018   ALBUMIN 4.1 04/27/2012   ALKPHOS 46 04/30/2018    Electrolytes Lab Results  Component Value Date   NA 138 04/30/2018   K 4.1 04/30/2018   CL 105 04/27/2012   CALCIUM 9.1 04/27/2012    Bone Lab Results  Component Value Date   VD25OH 29  04/30/2018    Inflammation (CRP: Acute Phase) (ESR: Chronic Phase) No results found for: CRP, ESRSEDRATE, LATICACIDVEN       Note: Above Lab results reviewed.    Physical Exam  Vitals: BP 116/73   Pulse 86   Temp 97.6 F (36.4 C)   Resp 18   Ht 5' 4 (1.626 m)   Wt 139 lb (63 kg)   SpO2 98%   BMI 23.86 kg/m  BMI: Estimated body mass index is 23.86 kg/m as calculated from the following:   Height as of this encounter: 5' 4 (1.626 m).   Weight as of this encounter: 139 lb (63 kg). Ideal: Ideal body weight: 54.7 kg (120 lb 9.5 oz) Adjusted ideal body weight: 58 kg (127 lb 15.3 oz) General appearance: Well nourished, well developed, and well hydrated. In no apparent acute distress Mental status: Alert, oriented x 3 (person, place, & time)       Respiratory: No evidence of acute respiratory distress Eyes: PERLA  Bilateral CMC pain, right knee pain worse with weightbearing Assessment   Diagnosis  1. Arthritis of carpometacarpal (CMC) joint of both thumbs   2. Bilateral primary osteoarthritis of knee      Updated Problems: No problems updated.  Plan of Care  Problem-specific:  Assessment and Plan    Bilateral primary osteoarthritis of knees   Chronic bilateral knee pain persists due to primary osteoarthritis. Previous steroid injections offered temporary relief. A right knee steroid injection is scheduled for February 17th for its anti-inflammatory effects. Genicular nerve block and potential  ablation are considered for future pain management if necessary. Gel injections were discussed but are not preferred at this time.  Bilateral primary osteoarthritis of first carpometacarpal joints   Chronic pain in the bilateral first carpometacarpal joints continues due to primary osteoarthritis. Previous steroid injections provided temporary relief. Bilateral first carpometacarpal joint steroid injections are scheduled for February 17th.       Ms. Jessica Haas has a  current medication list which includes the following long-term medication(s): estradiol, fluoxetine , hydrochlorothiazide, pitavastatin calcium, trazodone, and zolpidem .  Pharmacotherapy (Medications Ordered): No orders of the defined types were placed in this encounter.  Orders:  Orders Placed This Encounter  Procedures   Small Joint Injection/Arthrocentesis    Joint: CMC Laterality: Bilateral    Standing Status:   Future    Expected Date:   06/08/2024    Expiration Date:   05/04/2025    Scheduling Instructions:     Joint: CMC     Laterality: Bilateral   KNEE INJECTION    Local Anesthetic & Steroid injection.    Standing Status:   Future    Expected Date:   06/08/2024    Expiration Date:   05/04/2025    Scheduling Instructions:     Procedure: Steroid knee injection     Side: RIGHT     Sedation: None     Timeframe: As soon as schedule allows    Where will this procedure be performed?:   [facility]     BLF L3,4,5 05/26/23, #2 07/02/23; B/L L3,4,5 RFA 09/03/23, 03/08/24    Return in about 5 weeks (around 06/09/2024) for B/L [facility] joint injection + Right knee steroid.    Recent Visits Date Type Provider Dept  03/08/24 Procedure visit Marcelino Nurse, MD [facility]  Showing recent visits within past 90 days and meeting all other requirements Today's Visits Date Type Provider Dept  05/04/24 Office Visit Marcelino Nurse, MD [facility]  Showing today's visits and meeting all other requirements Future Appointments Date Type Provider Dept  06/09/24 Appointment Marcelino Nurse, MD [facility]  Showing future appointments within next 90 days and meeting all other requirements  I discussed the assessment and treatment plan with the patient. The patient was provided an opportunity to ask questions and all were answered. The patient agreed with the plan and demonstrated an understanding of the instructions.  Patient advised to call back or seek an in-person  evaluation if the symptoms or condition worsens.  I personally spent a total of 20 minutes in the care of the patient today including preparing to see the patient, getting/reviewing separately obtained history, performing a medically appropriate exam/evaluation, counseling and educating, placing orders, and documenting clinical information in the EHR.   Note by: Nurse Marcelino, MD (TTS and AI technology used. I apologize for any typographical errors that were not detected and corrected.) Date: 05/04/2024; Time: 4:37 PM

## 2024-05-12 ENCOUNTER — Telehealth: Payer: Self-pay | Admitting: Student in an Organized Health Care Education/Training Program

## 2024-05-12 DIAGNOSIS — M170 Bilateral primary osteoarthritis of knee: Secondary | ICD-10-CM

## 2024-05-12 DIAGNOSIS — M180 Bilateral primary osteoarthritis of first carpometacarpal joints: Secondary | ICD-10-CM

## 2024-05-12 NOTE — Telephone Encounter (Signed)
 Pt stated she suppose to call back in for the new meds that she was given it does works and she wants it.

## 2024-05-13 ENCOUNTER — Other Ambulatory Visit: Payer: Self-pay

## 2024-05-13 MED ORDER — JOURNAVX 50 MG PO TABS: 50.0000 mg | ORAL_TABLET | Freq: Two times a day (BID) | ORAL | 1 refills | Status: AC

## 2024-05-13 MED ORDER — JOURNAVX 50 MG PO TABS: 50.0000 mg | ORAL_TABLET | Freq: Two times a day (BID) | ORAL | 1 refills | Status: DC

## 2024-05-13 NOTE — Telephone Encounter (Signed)
 Attempted to reach patient to see what medication she was referring to, No answer and No VM set up.

## 2024-06-03 ENCOUNTER — Ambulatory Visit: Admitting: Dermatology

## 2024-06-09 ENCOUNTER — Ambulatory Visit: Admitting: Student in an Organized Health Care Education/Training Program

## 2024-08-02 ENCOUNTER — Ambulatory Visit: Admitting: Dermatology

## 2024-10-05 ENCOUNTER — Encounter: Admitting: Dermatology

## 2024-11-02 ENCOUNTER — Encounter: Admitting: Dermatology
